# Patient Record
Sex: Male | Born: 1950
Health system: Southern US, Community
[De-identification: ages and names within clinical notes are randomized; demographics above are authoritative.]

## PROBLEM LIST (undated history)

## (undated) DIAGNOSIS — T7840XA Allergy, unspecified, initial encounter: Secondary | ICD-10-CM

## (undated) DIAGNOSIS — K635 Polyp of colon: Secondary | ICD-10-CM

## (undated) DIAGNOSIS — Z91030 Bee allergy status: Secondary | ICD-10-CM

## (undated) DIAGNOSIS — I10 Essential (primary) hypertension: Secondary | ICD-10-CM

## (undated) DIAGNOSIS — Z91013 Allergy to seafood: Secondary | ICD-10-CM

## (undated) DIAGNOSIS — H269 Unspecified cataract: Secondary | ICD-10-CM

## (undated) DIAGNOSIS — K219 Gastro-esophageal reflux disease without esophagitis: Secondary | ICD-10-CM

## (undated) DIAGNOSIS — E7800 Pure hypercholesterolemia, unspecified: Secondary | ICD-10-CM

## (undated) DIAGNOSIS — M1990 Unspecified osteoarthritis, unspecified site: Secondary | ICD-10-CM

## (undated) DIAGNOSIS — C801 Malignant (primary) neoplasm, unspecified: Secondary | ICD-10-CM

## (undated) HISTORY — DX: Polyp of colon: K63.5

## (undated) HISTORY — DX: Unspecified cataract: H26.9

---

## 1978-03-04 DIAGNOSIS — Z91030 Bee allergy status: Secondary | ICD-10-CM

## 1978-03-04 HISTORY — DX: Bee allergy status: Z91.030

## 1983-03-05 HISTORY — PX: CARPAL TUNNEL RELEASE: SHX101

## 2002-01-10 ENCOUNTER — Emergency Department (HOSPITAL_COMMUNITY): Admission: EM | Admit: 2002-01-10 | Discharge: 2002-01-10 | Payer: Self-pay | Admitting: Emergency Medicine

## 2002-01-10 ENCOUNTER — Encounter: Payer: Self-pay | Admitting: Emergency Medicine

## 2004-04-09 ENCOUNTER — Other Ambulatory Visit: Admission: RE | Admit: 2004-04-09 | Discharge: 2004-04-09 | Payer: Self-pay | Admitting: *Deleted

## 2007-05-03 ENCOUNTER — Emergency Department (HOSPITAL_COMMUNITY): Admission: EM | Admit: 2007-05-03 | Discharge: 2007-05-04 | Payer: Self-pay | Admitting: Emergency Medicine

## 2007-05-07 ENCOUNTER — Ambulatory Visit (HOSPITAL_COMMUNITY): Admission: RE | Admit: 2007-05-07 | Discharge: 2007-05-07 | Payer: Self-pay | Admitting: Family Medicine

## 2007-06-29 ENCOUNTER — Ambulatory Visit: Payer: Self-pay | Admitting: Internal Medicine

## 2007-06-29 ENCOUNTER — Observation Stay (HOSPITAL_COMMUNITY): Admission: EM | Admit: 2007-06-29 | Discharge: 2007-06-30 | Payer: Self-pay | Admitting: Emergency Medicine

## 2007-07-16 ENCOUNTER — Ambulatory Visit: Payer: Self-pay | Admitting: Internal Medicine

## 2009-07-29 ENCOUNTER — Emergency Department (HOSPITAL_COMMUNITY): Admission: EM | Admit: 2009-07-29 | Discharge: 2009-07-29 | Payer: Self-pay | Admitting: Family Medicine

## 2010-05-21 LAB — B. BURGDORFI ANTIBODIES: B burgdorferi Ab IgG+IgM: 0.76 {ISR}

## 2010-05-21 LAB — ROCKY MTN SPOTTED FVR AB, IGM-BLOOD: RMSF IgM: 0.21 IV (ref 0.00–0.89)

## 2010-05-21 LAB — ROCKY MTN SPOTTED FVR AB, IGG-BLOOD: RMSF IgG: 0.44 IV

## 2010-07-17 NOTE — Discharge Summary (Signed)
NAMECARLIS, BLANCHARD            ACCOUNT NO.:  [id number]   MEDICAL RECORD NO.:  [id number]          PATIENT TYPE:  INP   LOCATION:  3705                         FACILITY:  [facility]   PHYSICIAN:  Gerrit Friends. Dietrich Pates, MD, FACCDATE OF BIRTH:  [birth year]   DATE OF ADMISSION:  06/29/2007  DATE OF DISCHARGE:  06/30/2007                               DISCHARGE SUMMARY   PRIMARY CARDIOLOGIST:  Pricilla Riffle, MD, [facility].   PRIMARY CARE PHYSICIAN:  Broadus John T. Pamalee Leyden, MD.   PROCEDURES PERFORMED DURING HOSPITALIZATION:  None.   FINAL DISCHARGE DIAGNOSES:  1. Atypical chest pain.  2. History of hypertension.  3. Hyperlipidemia.   HISTORY OF PRESENT ILLNESS:  This is a 60 year old Caucasian male who  presented to [facility] secondary to chest discomfort, which  awakened him at 4:00 a.m. with anterior chest pressure.  He described it  as soreness associated with some shortness of breath with no radiation.  He states that it was worse lying down or sitting straight up.  The  patient was given IV Toradol and nitroglycerin in the emergency room,  his chest discomfort did not improve, but he had no further complaints.  The patient was admitted to rule out myocardial infarction.  Also, a D-  dimer was evaluated secondary to the patient's recent long car trip to  [place].  She was seen and examined by Dr. Dietrich Pates and Joellyn Rued, physician assistant, on initial evaluation.   The patient did have cardiac enzymes cycled, which were found to be  negative x2 with a troponin of 0.05, 0.02, and 0.01 respectively.  His  TSH was 1.019, hemoglobin A1c 5.5,  PT 29, PT 12.0, INR 0.9; D-dimer  0.26, total cholesterol 201, HDL 33, triglycerides 137, LDL 141.  The  patient's EKG was found to be normal.   The following day, the patient was seen and examined by Dr. [place] Bing and followup EKG was completed and found to be negative and  normal.  His chest x-ray was reviewed revealing no  acute pulmonary  disease.  The patient was stable and was released to return home to  follow up with his primary care physician to check for other etiology of  chest discomfort and also to follow up with Dr. Dietrich Pates as an  outpatient to have a standard treadmill test in our office, which has  been ordered.   DISCHARGE LABS:  As above with the addition of sodium 139, potassium  4.2, chloride 107, CO2 25, BUN 12, creatinine 0.85, glucose 109,  hemoglobin 15.2, hematocrit 44.3, white blood cells 38.6, and platelets  251.   VITAL SIGNS ON DISCHARGE:  On discharge, blood pressure 113/72, heart  rate 66, respirations 18, temperature 98.2, and O2 sat 97% on room air.   DISCHARGE MEDICATIONS:  1. Amlodipine 5 mg daily.  2. Protonix 40 mg daily.  3. Zyrtec as needed.   ALLERGIES:  No known drug allergies.   FOLLOW-UP PLANS AND APPOINTMENTS:  1. The patient is to follow with Dr. Tanya Nones on his own accord for      continued  evaluation from medical standpoint.  2. The patient is to follow with Dr. Dietrich Pates on Monday Jul 20, 2007, at 10:45 a.m. for a treadmill stress test and followup      appointment the same day.   Time spent with discharge 30 minutes.      Bettey Mare. Lyman Bishop, NP      Gerrit Friends. Dietrich Pates, MD, [facility]  Electronically Signed    KML/MEDQ  D:  06/30/2007  T:  07/01/2007  Job:  [id number]   cc:   Broadus John T. Pamalee Leyden, MD

## 2010-07-17 NOTE — Procedures (Signed)
[facility]                              EXERCISE TREADMILL   NAME:[name], [name]                     MRN:          [id number]  DATE:07/16/2007                            DOB:          06/12/1950    IDENTIFICATION:  The patient is a 60 year old recently discharged from  [facility] admitted for atypical chest pain ruled out.  Set up  for a treadmill test.   STRESS DATA:  The patient exercised in the Bruce protocol.  Baseline EKG  showed sinus rhythm at 70 beats per minute.  Baseline blood pressure  141/81.   The patient exercised for 9 minutes 32 seconds to a peak heart rate of  151 which was 91% predicted maximum.  Peak blood pressure was 206/76.  This was for a total of 10.9 mets.  The patient experienced no chest  pain.  EKG showed no significant ST changes to suggest ischemia.  Note,  there were some isolated PVCs during the study in the recovery period  but not significant.   IMPRESSION:  Exercise treadmill, clinically and electrically negative  for ischemia, good exercise tolerance.   IMPRESSION:  Overall, the patient's chest pain was atypical.  His  treadmill again supports this.   The patient on admission had a fasting lipid panel done that showed a  total cholesterol of 201, HDL low at 33, LDL of 141.  I have discussed  this with him.  I think probably intervening he thinks his diet cannot  be changed much.  He has had weight loss in the past and he said he has  not changed his number much.  Would probably consider a Statin to lower  his cardiac risk status.  He said he will discuss with his primary and  we will forward this note to him.     Pricilla Riffle, MD, [facility]  Electronically Signed    PVR/MedQ  DD: 07/16/2007  DT: 07/16/2007  Job #: [id number]   cc:   Broadus John T. Pamalee Leyden, MD

## 2010-07-17 NOTE — H&P (Signed)
[name], [name] NO.:  [id number]   MEDICAL RECORD NO.:  [id number]          PATIENT TYPE:  INP   LOCATION:  3705                         FACILITY:  [facility]   PHYSICIAN:  Pricilla Riffle, MD, FACCDATE OF BIRTH:  1950-03-30   DATE OF ADMISSION:  06/29/2007  DATE OF DISCHARGE:                              HISTORY & PHYSICAL   HISTORY:  [name] is a 60 year old white male who presented via  private vehicle to [facility] secondary to chest  discomfort.  He states he was awaken at approximately 4 a.m. with an  anterior chest pressure sensation.  He also describes this as a soreness  associated with some shortness of breath.  He denied any radiation,  nausea, vomiting, or diaphoresis.  That discomfort is pleuritic in  nature.  It also seems to worsen with lying down or sitting straight up.  He gives it a 3-5 on a scale of 0-10.  Last time it was 0 was prior to  onset.  He denies prior recurrence change with exertion, associated gas,  or water brash.  In the emergency room, he has received IV Toradol and  nitroglycerin.  It has not improved his discomfort but it has made him  care less about his discomfort.  He does admit to recent travel to  [place], [place] over the weekend, however, he denies any recent  accidents or injuries.   PAST MEDICAL HISTORY:  Notable for no known drug allergies.  He is on a  blood pressure medication at home.  He is not clear what this medication  is.  Based on his last ER visit in March 2009, it could consist of  either ramipril, or Diovan or Cardura.  Past medical history is notable  for hypertension, he states he checks his blood pressure at home.  His  blood pressure ranges from 120s to 130s over 60s to 80s.  He has a  history of hyperlipidemia.  However, he is not sure when the last check  was or what the specific results were.  He has been on medications in  the past, however, he thinks that he has not gotten  his cholesterol  medication filled within a last month or two.  He does recall seeing a  cardiologist approximately 5 years ago and recalls a stress test.  He  states that this was okay.  He cannot recall the physician's name, the  type of stress test or any other testing.   SURGICAL HISTORY:  Notable for a right elbow release with some teeth  removal.  He specifically denies diabetes, myocardial infarctions, CVA,  COPD, bleeding, dyscrasia, thyroid disorder, or renal dysfunction.   SOCIAL HISTORY:  He lives in [place] with his wife.  He has one  son, age 77.  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  He also enjoys Advertising copywriter.  He does not smoke.  He may have 1 beer per day.  He denies  any drugs or herbal medications.  He tries to adhere  to a low-fat diet.  He rarely exercises.   FAMILY HISTORY:  His mother is deceased at the age of 37 with CHF.  She  also had a history of COPD and hypertension.  Father is deceased at the  age of 75 with CHF and he has had a AAA rupture.  He does not have any  brothers or sisters.   REVIEW OF SYMPTOMS:  In addition to the above, is notable for contacts  and nocturia.  All other system points are unremarkable.   PHYSICAL EXAMINATION:  GENERAL:  Well-nourished, well-developed pleasant  white male, in no acute distress.  VITAL SIGNS:  Temperature is 98.7, initial blood pressure is 154/84, now  is 116/56, pulse is 81, and respirations 18.  HEENT:  Unremarkable.  NECK:  Supple without thyromegaly, adenopathy, JVD, or carotid bruits.  CHEST:  Symmetrical excursion.  LUNGS:  Clear to auscultation and do not appreciate pleural rub.  HEART:  PMI is not displaced.  Regular rate and rhythm.  He does have a  very small 1/6 systolic murmur best appreciated at the left sternal  border.  He did not have a cardiac rub.  SKIN:  Integument appears to be intact.  ABDOMEN:  Unremarkable and  without masses or tenderness.  EXTREMITIES:  Negative cyanosis, clubbing, or edema.  MUSCULOSKELETAL:  Unremarkable.  He specifically does not have any chest  wall tenderness.  There is no reproduction or increase of his chest  discomfort with approximately a full range of motion.  NEUROLOGICAL:  Unremarkable.   Chest x-ray showed no active disease.  EKG showed normal sinus rhythm.  Normal access.  Ventricular rate 77.  Non-specific ST-T wave changes,  early R.  No acute changes compared to an EKG in March 2009.   I-STAT showed H&H of 15.6 and 46.0.  Sodium 142, potassium 4.0, BUN 13,  creatinine 1.0, glucose 101, point-of-care marker were negative x1.   IMPRESSION:  1. Prolonged atypical chest discomfort with initial EKG and point-of-      care marker unremarkable.  2. Hypertension.  3. History is noted for past medical history.   DISPOSITION:  Dr. Dietrich Pates reviewed the patient's history and spoke with  and examined the patient and agrees with the above.  He will remain here  in the night for observation.  While here in the emergency room, we will  give him a one time dose of morphine as well as a GI cocktail and  attempts to relieve his discomfort.  We will check his D-dimer as well  as rule out myocardial infarction.  We will place him on a beta blocker  as well as Protonix to cover any potential GI issues.  He is ruled out  for myocardial infarction.  He could be discharged home in the morning  and followup with his primary care physician.  Further recommendations  will be based on findings.      Dale Rued, PA-C      Pricilla Riffle, MD, [facility]  Electronically Signed    EW/MEDQ  D:  06/29/2007  T:  06/29/2007  Job:  [id number]   cc:   Pricilla Riffle, MD, [facility]  Dr. Rayne Du

## 2010-11-26 LAB — CBC
MCV: 94
Platelets: 232
RBC: 4.3
WBC: 12.3 — ABNORMAL HIGH

## 2010-11-26 LAB — DIFFERENTIAL
Eosinophils Absolute: 0
Lymphocytes Relative: 2 — ABNORMAL LOW
Lymphs Abs: 0.2 — ABNORMAL LOW
Neutro Abs: 11.5 — ABNORMAL HIGH
Neutrophils Relative %: 93 — ABNORMAL HIGH

## 2010-11-26 LAB — I-STAT 8, (EC8 V) (CONVERTED LAB)
Acid-base deficit: 6 — ABNORMAL HIGH
BUN: 17
Bicarbonate: 19.1 — ABNORMAL LOW
Chloride: 107
Glucose, Bld: 110 — ABNORMAL HIGH
HCT: 44
Hemoglobin: 15
Operator id: 277751
Potassium: 3.5
Sodium: 137
TCO2: 20
pCO2, Ven: 36.7 — ABNORMAL LOW
pH, Ven: 7.325 — ABNORMAL HIGH

## 2010-11-26 LAB — POCT I-STAT CREATININE
Creatinine, Ser: 1.2
Operator id: 277751

## 2010-11-26 LAB — URINALYSIS, ROUTINE W REFLEX MICROSCOPIC
Bilirubin Urine: NEGATIVE
Glucose, UA: NEGATIVE
Ketones, ur: NEGATIVE
Nitrite: NEGATIVE
pH: 5.5

## 2010-11-26 LAB — CREATININE, SERUM
Creatinine, Ser: 0.92
GFR calc non Af Amer: >60

## 2010-11-27 LAB — CK TOTAL AND CKMB (NOT AT ARMC)
CK, MB: 3.8
Relative Index: 2.2

## 2010-11-27 LAB — DIFFERENTIAL
Basophils Relative: 0
Monocytes Absolute: 0.9
Monocytes Relative: 7
Neutro Abs: 11.2 — ABNORMAL HIGH

## 2010-11-27 LAB — CARDIAC PANEL(CRET KIN+CKTOT+MB+TROPI)
CK, MB: 1.6
Relative Index: 1.5
Troponin I: 0.02

## 2010-11-27 LAB — APTT: aPTT: 29

## 2010-11-27 LAB — HEMOGLOBIN A1C
Hgb A1c MFr Bld: 5.5
Mean Plasma Glucose: 119

## 2010-11-27 LAB — COMPREHENSIVE METABOLIC PANEL
Albumin: 3.5
BUN: 12
Chloride: 107
Creatinine, Ser: 0.85
Total Bilirubin: 1
Total Protein: 6.3

## 2010-11-27 LAB — POCT I-STAT, CHEM 8
Calcium, Ion: 1.21
Glucose, Bld: 101 — ABNORMAL HIGH
HCT: 46
Hemoglobin: 15.6

## 2010-11-27 LAB — LIPID PANEL
HDL: 33 — ABNORMAL LOW
Total CHOL/HDL Ratio: 6.1
Triglycerides: 137
VLDL: 27

## 2010-11-27 LAB — POCT CARDIAC MARKERS
CKMB, poc: 3.5
Myoglobin, poc: 89.4

## 2010-11-27 LAB — CBC
Platelets: 251
RDW: 12.9

## 2011-04-03 ENCOUNTER — Other Ambulatory Visit: Payer: Self-pay | Admitting: Family Medicine

## 2011-04-03 DIAGNOSIS — R609 Edema, unspecified: Secondary | ICD-10-CM

## 2011-04-04 ENCOUNTER — Ambulatory Visit
Admission: RE | Admit: 2011-04-04 | Discharge: 2011-04-04 | Disposition: A | Discharge status: Home / Self Care | Payer: 59 | Source: Ambulatory Visit | Attending: Family Medicine | Admitting: Family Medicine

## 2011-04-04 DIAGNOSIS — R609 Edema, unspecified: Secondary | ICD-10-CM

## 2012-01-13 ENCOUNTER — Encounter (HOSPITAL_COMMUNITY): Payer: Self-pay | Admitting: *Deleted

## 2012-01-13 ENCOUNTER — Emergency Department (HOSPITAL_COMMUNITY)
Admission: EM | Admit: 2012-01-13 | Discharge: 2012-01-13 | Disposition: A | Discharge status: Home / Self Care | Payer: 59 | Attending: Emergency Medicine | Admitting: Emergency Medicine

## 2012-01-13 DIAGNOSIS — I10 Essential (primary) hypertension: Secondary | ICD-10-CM | POA: Insufficient documentation

## 2012-01-13 DIAGNOSIS — R42 Dizziness and giddiness: Secondary | ICD-10-CM | POA: Insufficient documentation

## 2012-01-13 DIAGNOSIS — R5383 Other fatigue: Secondary | ICD-10-CM | POA: Insufficient documentation

## 2012-01-13 DIAGNOSIS — K219 Gastro-esophageal reflux disease without esophagitis: Secondary | ICD-10-CM | POA: Insufficient documentation

## 2012-01-13 DIAGNOSIS — R51 Headache: Secondary | ICD-10-CM | POA: Insufficient documentation

## 2012-01-13 DIAGNOSIS — R5381 Other malaise: Secondary | ICD-10-CM | POA: Insufficient documentation

## 2012-01-13 DIAGNOSIS — I951 Orthostatic hypotension: Secondary | ICD-10-CM | POA: Insufficient documentation

## 2012-01-13 DIAGNOSIS — Z79899 Other long term (current) drug therapy: Secondary | ICD-10-CM | POA: Insufficient documentation

## 2012-01-13 DIAGNOSIS — E876 Hypokalemia: Secondary | ICD-10-CM | POA: Insufficient documentation

## 2012-01-13 DIAGNOSIS — R55 Syncope and collapse: Secondary | ICD-10-CM

## 2012-01-13 HISTORY — DX: Gastro-esophageal reflux disease without esophagitis: K21.9

## 2012-01-13 HISTORY — DX: Essential (primary) hypertension: I10

## 2012-01-13 LAB — BASIC METABOLIC PANEL
BUN: 14 mg/dL (ref 6–23)
Chloride: 100 mEq/L (ref 96–112)
Creatinine, Ser: 0.97 mg/dL (ref 0.50–1.35)
GFR calc Af Amer: >90 mL/min (ref >90)
GFR calc non Af Amer: 89 mL/min — ABNORMAL LOW (ref >90)
Glucose, Bld: 138 mg/dL — ABNORMAL HIGH (ref 70–99)

## 2012-01-13 LAB — CBC WITH DIFFERENTIAL/PLATELET
Basophils Relative: 0 % (ref 0–1)
Eosinophils Absolute: 0 10*3/uL (ref 0.0–0.7)
HCT: 41.3 % (ref 39.0–52.0)
Hemoglobin: 14.7 g/dL (ref 13.0–17.0)
Lymphs Abs: 1 10*3/uL (ref 0.7–4.0)
MCH: 31.6 pg (ref 26.0–34.0)
MCHC: 35.6 g/dL (ref 30.0–36.0)
Monocytes Absolute: 0.6 10*3/uL (ref 0.1–1.0)
Monocytes Relative: 6 % (ref 3–12)
RBC: 4.65 MIL/uL (ref 4.22–5.81)

## 2012-01-13 LAB — URINALYSIS, ROUTINE W REFLEX MICROSCOPIC
Bilirubin Urine: NEGATIVE
Hgb urine dipstick: NEGATIVE
Ketones, ur: NEGATIVE mg/dL
Nitrite: NEGATIVE
Specific Gravity, Urine: 1.014 (ref 1.005–1.030)
pH: 7.5 (ref 5.0–8.0)

## 2012-01-13 LAB — POCT I-STAT TROPONIN I

## 2012-01-13 MED ORDER — SODIUM CHLORIDE 0.9 % IV BOLUS (SEPSIS)
1000.0000 mL | Freq: Once | INTRAVENOUS | Status: AC
  Administered 2012-01-13: 1000 mL via INTRAVENOUS

## 2012-01-13 MED ORDER — POTASSIUM CHLORIDE 10 MEQ/100ML IV SOLN
10.0000 meq | Freq: Once | INTRAVENOUS | Status: AC
  Administered 2012-01-13: 10 meq via INTRAVENOUS
  Filled 2012-01-13: qty 100

## 2012-01-13 MED ORDER — POTASSIUM CHLORIDE CRYS ER 20 MEQ PO TBCR
40.0000 meq | EXTENDED_RELEASE_TABLET | Freq: Once | ORAL | Status: AC
  Administered 2012-01-13: 40 meq via ORAL
  Filled 2012-01-13: qty 2

## 2012-01-13 NOTE — ED Provider Notes (Signed)
History     CSN: [id number]  Arrival date & time 01/13/12  1002   First MD Initiated Contact with Patient 01/13/12 1011      Chief Complaint  Patient presents with  . Near Syncope    (Consider location/radiation/quality/duration/timing/severity/associated sxs/prior treatment) HPI Comments: [name] is a 61 y.o. Male who presented today with a complaint of dizziness, weakness, near syncope. Pt states he woke up this morning with a headache. States was thinking his BP may be high, so he took one of his old BP medications which he has not had in over a year. States he used to be on this medication, and 4 others, but his doctor took him off of them because blood pressure has been good. States went to work where he began feeling dizzy and weak. He was taken to a nurse who measured his BP and it was 80/40. Pt States he is feeling slightly better. Denies chest pain, SOB, denies recent travel, denies recent surgeries, no recent illnesses   Past Medical History  Diagnosis Date  . Hypertension   . GERD (gastroesophageal reflux disease)     History reviewed. No pertinent past surgical history.  No family history on file.  History  Substance Use Topics  . Smoking status: Never Smoker   . Smokeless tobacco: Not on file  . Alcohol Use:       Review of Systems  HENT: Negative for neck pain and neck stiffness.   Neurological: Positive for dizziness, weakness, light-headedness and headaches.  All other systems reviewed and are negative.    Allergies  Review of patient's allergies indicates no known allergies.  Home Medications   Current Outpatient Rx  Name  Route  Sig  Dispense  Refill  . DOXAZOSIN MESYLATE 8 MG PO TABS   Oral   Take 8 mg by mouth once.         Marland Kitchen FAMOTIDINE 10 MG PO TABS   Oral   Take 10 mg by mouth at bedtime.         Marland Kitchen HYDROCHLOROTHIAZIDE 25 MG PO TABS   Oral   Take 25 mg by mouth daily.         Marland Kitchen PANTOPRAZOLE SODIUM 40 MG PO TBEC  Oral   Take 40 mg by mouth daily.         Marland Kitchen PRAVASTATIN SODIUM 40 MG PO TABS   Oral   Take 40 mg by mouth daily.           BP 79/73  Pulse 72  Temp 97.4 F (36.3 C) (Oral)  Resp 18  Physical Exam  Nursing note and vitals reviewed. Constitutional: He is oriented to person, place, and time. He appears well-developed and well-nourished. No distress.  HENT:  Head: Normocephalic and atraumatic.  Right Ear: External ear normal.  Left Ear: External ear normal.  Nose: Nose normal.  Mouth/Throat: Oropharynx is clear and moist.  Eyes: Conjunctivae normal and EOM are normal. Pupils are equal, round, and reactive to light.  Neck: Neck supple.  Cardiovascular: Normal rate, regular rhythm and normal heart sounds.   Pulmonary/Chest: Effort normal and breath sounds normal. No respiratory distress. He has no wheezes. He has no rales.  Abdominal: Soft. Bowel sounds are normal. He exhibits no distension. There is no tenderness. There is no rebound.  Musculoskeletal: He exhibits no edema.  Neurological: He is alert and oriented to person, place, and time. He displays normal reflexes. No cranial nerve deficit. Coordination normal.  Skin:  Skin is warm and dry.  Psychiatric: He has a normal mood and affect.    ED Course  Procedures (including critical care time)  Pt took doxazosin 8mg  today, having weakness, dizziness, presyncopal episode. No HA or CP, or SOB currently. Will get orthostatics, labs, ECG  Results for orders placed during the hospital encounter of 01/13/12  CBC WITH DIFFERENTIAL      Component Value Range   WBC 10.5  4.0 - 10.5 K/uL   RBC 4.65  4.22 - 5.81 MIL/uL   Hemoglobin 14.7  13.0 - 17.0 g/dL   HCT 09.8  11.9 - 14.7 %   MCV 88.8  78.0 - 100.0 fL   MCH 31.6  26.0 - 34.0 pg   MCHC 35.6  30.0 - 36.0 g/dL   RDW 82.9  56.2 - 13.0 %   Platelets 217  150 - 400 K/uL   Neutrophils Relative 84 (*) 43 - 77 %   Neutro Abs 8.8 (*) 1.7 - 7.7 K/uL   Lymphocytes Relative 9 (*)  12 - 46 %   Lymphs Abs 1.0  0.7 - 4.0 K/uL   Monocytes Relative 6  3 - 12 %   Monocytes Absolute 0.6  0.1 - 1.0 K/uL   Eosinophils Relative 0  0 - 5 %   Eosinophils Absolute 0.0  0.0 - 0.7 K/uL   Basophils Relative 0  0 - 1 %   Basophils Absolute 0.0  0.0 - 0.1 K/uL  BASIC METABOLIC PANEL      Component Value Range   Sodium 139  135 - 145 mEq/L   Potassium 2.9 (*) 3.5 - 5.1 mEq/L   Chloride 100  96 - 112 mEq/L   CO2 28  19 - 32 mEq/L   Glucose, Bld 138 (*) 70 - 99 mg/dL   BUN 14  6 - 23 mg/dL   Creatinine, Ser 8.65  0.50 - 1.35 mg/dL   Calcium 8.9  8.4 - 78.4 mg/dL   GFR calc non Af Amer 89 (*) >90 mL/min   GFR calc Af Amer >90  >90 mL/min  URINALYSIS, ROUTINE W REFLEX MICROSCOPIC      Component Value Range   Color, Urine YELLOW  YELLOW   APPearance CLEAR  CLEAR   Specific Gravity, Urine 1.014  1.005 - 1.030   pH 7.5  5.0 - 8.0   Glucose, UA NEGATIVE  NEGATIVE mg/dL   Hgb urine dipstick NEGATIVE  NEGATIVE   Bilirubin Urine NEGATIVE  NEGATIVE   Ketones, ur NEGATIVE  NEGATIVE mg/dL   Protein, ur NEGATIVE  NEGATIVE mg/dL   Urobilinogen, UA 0.2  0.0 - 1.0 mg/dL   Nitrite NEGATIVE  NEGATIVE   Leukocytes, UA NEGATIVE  NEGATIVE  POCT I-STAT TROPONIN I      Component Value Range   Troponin i, poc 0.00  0.00 - 0.08 ng/mL   Comment 3             Date: 01/13/2012  Rate: 71  Rhythm: normal sinus rhythm  QRS Axis: normal  Intervals: normal  ST/T Wave abnormalities: nonspecific T wave changes  Conduction Disutrbances:none  Narrative Interpretation:   Old EKG Reviewed: none available   Filed Vitals:   01/13/12 1130 01/13/12 1234 01/13/12 1235 01/13/12 1237  BP: 122/71 133/70 130/75 123/88  Pulse: 76 78 89 91  Temp:      TempSrc:      Resp: 23     SpO2: 95%  1. Orthostatic hypotension   2. Pre-syncope   3. Hypokalemia       MDM  Pt with presyncopal episode at work. Pt was found to have a BP of 80/40, transferred here for further eval. By the time pt  arrived here, his BP was 108/58. Pt is feeling better. Orthostatics obtained and BP did drop to 79/73 when standing up, pt however, did not get dizzy. Pt received fluids. Potassium PO and IV. He ate lunch. His repeat orthostatics are normal. He is feeling back to normal. H ambulated in the hallway with no distress. No arrhythmia's on ECG. He is stable for d/c home. i do suspect his symptoms were due to a drop in BP caused by takin a high dose of doxazosin which he normally does not take. Pt is to follow up with PCP for recheck of potassium and BP in 2-3 days.         Lottie Mussel, PA 01/13/12 1530

## 2012-01-13 NOTE — ED Notes (Signed)
Troponin I results as follows: 0.00  Ng/mL

## 2012-01-13 NOTE — ED Notes (Signed)
While at work became dizzy, lightheaded & felt faint. Nurse at work stated BP 80/40. No fluids given by EMS

## 2012-01-13 NOTE — ED Notes (Signed)
Pt c/o h/a this am, thought BP was high therefore took an additional BP pill that he used to take in additional to regular BP med. Had near syncopal episode while at work. Denies CP, palpitations, SOB. Upon arrival to to ED denied lightheadedness, dizziness. "I didn't want to come here. I feel fine now".

## 2012-01-14 NOTE — ED Provider Notes (Signed)
Medical screening examination/treatment/procedure(s) were performed by non-physician practitioner and as supervising physician I was immediately available for consultation/collaboration.  Concur with assessment and plan.  Shelda Jakes, MD 01/14/12 [phone]

## 2012-01-22 ENCOUNTER — Ambulatory Visit (INDEPENDENT_AMBULATORY_CARE_PROVIDER_SITE_OTHER): Payer: 59 | Admitting: *Deleted

## 2012-01-22 DIAGNOSIS — R1900 Intra-abdominal and pelvic swelling, mass and lump, unspecified site: Secondary | ICD-10-CM

## 2012-05-25 ENCOUNTER — Telehealth: Payer: Self-pay | Admitting: Family Medicine

## 2012-05-25 ENCOUNTER — Encounter: Payer: Self-pay | Admitting: Family Medicine

## 2012-05-25 MED ORDER — PREDNISONE 20 MG PO TABS: 20.0000 mg | ORAL_TABLET | Freq: Every day | ORAL | Status: DC

## 2012-05-25 NOTE — Telephone Encounter (Signed)
Please call out prenisone dose pack

## 2012-05-25 NOTE — Telephone Encounter (Signed)
Pred rx sent to pharm.

## 2012-05-25 NOTE — Telephone Encounter (Signed)
This encounter was created in error - please disregard.

## 2012-05-26 ENCOUNTER — Telehealth: Payer: Self-pay | Admitting: Family Medicine

## 2012-05-26 NOTE — Telephone Encounter (Signed)
RX called in yesterday for allergic reaction to fresh wood.  Predinisone taper dose called in.  Took three doses yesterday.  Today is taking second day but is starting to have break through allergic reaction again. Facial redness and spots and itching.  Concerned usually once starts steroid does OK.  What else can be done??  Does he need some sort of shot?? Cell # [phone]

## 2012-05-26 NOTE — Telephone Encounter (Signed)
Pt called.  Advised stay on Prednisone oral and take Benadryl 50mg  Q6hr prn per Dr. Tanya Nones.  If does not improve we will need to see.

## 2012-05-26 NOTE — Telephone Encounter (Signed)
Shot is equivalent to taper med. Start benadryl 50 q 6 hours or NTBS if worse

## 2012-06-18 ENCOUNTER — Encounter: Payer: Self-pay | Admitting: Family Medicine

## 2012-06-18 ENCOUNTER — Ambulatory Visit (INDEPENDENT_AMBULATORY_CARE_PROVIDER_SITE_OTHER): Payer: 59 | Admitting: Family Medicine

## 2012-06-18 VITALS — BP 122/64 | HR 78 | Temp 98.4°F | Resp 16 | Wt 229.0 lb

## 2012-06-18 DIAGNOSIS — M75100 Unspecified rotator cuff tear or rupture of unspecified shoulder, not specified as traumatic: Secondary | ICD-10-CM

## 2012-06-18 DIAGNOSIS — Z5189 Encounter for other specified aftercare: Secondary | ICD-10-CM

## 2012-06-18 DIAGNOSIS — I10 Essential (primary) hypertension: Secondary | ICD-10-CM | POA: Insufficient documentation

## 2012-06-18 DIAGNOSIS — T7840XD Allergy, unspecified, subsequent encounter: Secondary | ICD-10-CM

## 2012-06-18 DIAGNOSIS — IMO0002 Reserved for concepts with insufficient information to code with codable children: Secondary | ICD-10-CM

## 2012-06-18 DIAGNOSIS — M7551 Bursitis of right shoulder: Secondary | ICD-10-CM

## 2012-06-18 DIAGNOSIS — E785 Hyperlipidemia, unspecified: Secondary | ICD-10-CM | POA: Insufficient documentation

## 2012-06-18 MED ORDER — LEVOCETIRIZINE DIHYDROCHLORIDE 5 MG PO TABS: 5.0000 mg | ORAL_TABLET | Freq: Every evening | ORAL | Status: DC

## 2012-06-18 NOTE — Progress Notes (Signed)
  Subjective:    Patient ID: [name], male    DOB: 1951-01-27, 62 y.o.   MRN: [id number]  HPI  Problem #1-allergic reaction, patient reports that when he handles wet wood he develops an allergic reaction. He states he develops a flushing in the face and then red scaly skin at the hairline of the scalp and eyebrows, his mustache, and behind his ears. He states it iproduces serous yellow discharge and scale.  It responds to corticosteroids.  Although the rash sounds like seborrheic dermatitis, he states it only happens when he handles wet wood or wood sap.  Problem #2-right shoulder pain, outpatient in January. He is having pain with abduction of his right shoulder. His exam revealed subacromial bursitis. At that time he underwent a cortisone injection. He states the pain improved for over 3 months. However it has gradually returned. He has pain with abduction. Headaches at the top of his shoulder. Past Medical History  Diagnosis Date  . Hypertension   . GERD (gastroesophageal reflux disease)    Current Outpatient Prescriptions on File Prior to Visit  Medication Sig Dispense Refill  . doxazosin (CARDURA) 8 MG tablet Take by mouth once.       . famotidine (PEPCID) 10 MG tablet Take 10 mg by mouth at bedtime.      . hydrochlorothiazide (HYDRODIURIL) 25 MG tablet Take 25 mg by mouth daily.      . pantoprazole (PROTONIX) 40 MG tablet Take 40 mg by mouth daily.      . pravastatin (PRAVACHOL) 40 MG tablet Take 40 mg by mouth daily.       No current facility-administered medications on file prior to visit.   No Known Allergies  Review of Systems  All other systems reviewed and are negative.       Objective:   Physical Exam  Constitutional: He appears well-developed and well-nourished.  Cardiovascular: Normal rate and normal heart sounds.   No murmur heard. Pulmonary/Chest: Effort normal and breath sounds normal.  Musculoskeletal: Normal range of motion. He exhibits no edema and  no tenderness.  Skin: Skin is warm and dry. No rash noted. No erythema. No pallor.   he has a positive empty can sign his right shoulder.  Hawkins test is negative.        Assessment & Plan:  Allergic reaction, subsequent encounter  Subacromial bursitis, right  I gave the patient prescription for his Xyzal 5 mg tab.  He is to take one daily. If the reaction stop happening we will continue with his therapy. If he continues to have this reaction treelike savory dermatitis daily Nizoral shampoo.  If it continues to persist would consider sending him to an allergist.  Discussed sending him to physical therapy versus obtaining an MRI of his right shoulder. The patient like to consider his decisions at home tonight and then call me back with his decision.  I recommended he proceed with physical therapy.

## 2012-06-29 ENCOUNTER — Telehealth: Payer: Self-pay | Admitting: Family Medicine

## 2012-06-29 MED ORDER — DOXAZOSIN MESYLATE 2 MG PO TABS: 2.0000 mg | ORAL_TABLET | Freq: Every day | ORAL | Status: DC

## 2012-06-29 NOTE — Telephone Encounter (Signed)
Rx Refilled  

## 2012-07-06 ENCOUNTER — Telehealth: Payer: Self-pay | Admitting: Family Medicine

## 2012-07-06 MED ORDER — DOXAZOSIN MESYLATE 4 MG PO TABS: 4.0000 mg | ORAL_TABLET | Freq: Every day | ORAL | Status: DC

## 2012-07-06 NOTE — Telephone Encounter (Signed)
Rx filled

## 2012-07-06 NOTE — Telephone Encounter (Signed)
Patient aware.

## 2012-07-06 NOTE — Telephone Encounter (Signed)
Medication corrected.

## 2012-07-07 ENCOUNTER — Encounter: Payer: Self-pay | Admitting: Family Medicine

## 2012-07-07 ENCOUNTER — Ambulatory Visit (INDEPENDENT_AMBULATORY_CARE_PROVIDER_SITE_OTHER): Payer: 59 | Admitting: Family Medicine

## 2012-07-07 VITALS — BP 132/74 | HR 74 | Temp 98.1°F | Resp 16 | Wt 226.0 lb

## 2012-07-07 DIAGNOSIS — W57XXXA Bitten or stung by nonvenomous insect and other nonvenomous arthropods, initial encounter: Secondary | ICD-10-CM

## 2012-07-07 DIAGNOSIS — L02419 Cutaneous abscess of limb, unspecified: Secondary | ICD-10-CM

## 2012-07-07 DIAGNOSIS — L03119 Cellulitis of unspecified part of limb: Secondary | ICD-10-CM

## 2012-07-07 MED ORDER — DOXYCYCLINE HYCLATE 100 MG PO TABS: 100.0000 mg | ORAL_TABLET | Freq: Two times a day (BID) | ORAL | Status: DC

## 2012-07-07 NOTE — Progress Notes (Signed)
  Subjective:    Patient ID: [name], male    DOB: 1950/12/20, 62 y.o.   MRN: [id number]  HPI One week ago the patient suffered a tick bite. The tick was on for possibly 48 hours. There is now a red hot ring on his left hip. It is warm to the touch. Slightly tender. It has more the appearance of urticaria. He denies any systemic illness. He denies any fevers or chills. He denies any myalgias or arthralgias.   Past Medical History  Diagnosis Date  . Hypertension   . GERD (gastroesophageal reflux disease)    Current Outpatient Prescriptions on File Prior to Visit  Medication Sig Dispense Refill  . doxazosin (CARDURA) 4 MG tablet Take 1 tablet (4 mg total) by mouth daily.  30 tablet  5  . famotidine (PEPCID) 10 MG tablet Take 10 mg by mouth at bedtime.      . hydrochlorothiazide (HYDRODIURIL) 25 MG tablet Take 25 mg by mouth daily.      Marland Kitchen levocetirizine (XYZAL) 5 MG tablet Take 1 tablet (5 mg total) by mouth every evening.  30 tablet  1  . losartan (COZAAR) 100 MG tablet Take 100 mg by mouth daily.      . pantoprazole (PROTONIX) 40 MG tablet Take 40 mg by mouth daily.      . pravastatin (PRAVACHOL) 40 MG tablet Take 40 mg by mouth daily.       No current facility-administered medications on file prior to visit.   No Known Allergies History   Social History  . Marital Status: Married    Spouse Name: N/A    Number of Children: N/A  . Years of Education: N/A   Occupational History  . Not on file.   Social History Main Topics  . Smoking status: Never Smoker   . Smokeless tobacco: Not on file  . Alcohol Use:   . Drug Use: No  . Sexually Active:    Other Topics Concern  . Not on file   Social History Narrative  . No narrative on file      Review of Systems  Musculoskeletal: Negative for myalgias, back pain, joint swelling and arthralgias.  Neurological: Negative for dizziness, weakness and headaches.  Hematological: Negative for adenopathy.       Objective:    Physical Exam  Vitals reviewed. Cardiovascular: Normal rate, regular rhythm and normal heart sounds.   No murmur heard. Pulmonary/Chest: Effort normal and breath sounds normal. No respiratory distress. He has no wheezes. He has no rales.   5 cm red hot ring on the patient's left hip        Assessment & Plan:  1. Tick bite Patient precautions regarding Lyme disease. Patient to call me if the red patch of cellulitis turns into a spreading red ring indicative of early Lyme disease.  If so I will extend coverage for a total of 21 days.  2. Cellulitis and abscess of leg For now I will treat a cellulitis with doxycycline 100 mg by mouth twice a day for 10 days.

## 2012-07-30 ENCOUNTER — Telehealth: Payer: Self-pay | Admitting: Family Medicine

## 2012-07-30 DIAGNOSIS — M7591 Shoulder lesion, unspecified, right shoulder: Secondary | ICD-10-CM

## 2012-07-30 NOTE — Telephone Encounter (Signed)
Pt states right shoulder is still hurting and would like for you to order MRI.

## 2012-07-30 NOTE — Telephone Encounter (Signed)
i will order mri, he could get cortisone shot here if he'd like.

## 2012-08-07 ENCOUNTER — Ambulatory Visit
Admission: RE | Admit: 2012-08-07 | Discharge: 2012-08-07 | Disposition: A | Discharge status: Home / Self Care | Payer: 59 | Source: Ambulatory Visit | Attending: Family Medicine | Admitting: Family Medicine

## 2012-08-07 DIAGNOSIS — M7591 Shoulder lesion, unspecified, right shoulder: Secondary | ICD-10-CM

## 2012-08-10 ENCOUNTER — Other Ambulatory Visit: Payer: Self-pay | Admitting: Family Medicine

## 2012-08-10 DIAGNOSIS — M7581 Other shoulder lesions, right shoulder: Secondary | ICD-10-CM

## 2012-08-11 ENCOUNTER — Other Ambulatory Visit: Payer: Self-pay | Admitting: Family Medicine

## 2012-08-11 NOTE — Telephone Encounter (Signed)
Med rf °

## 2012-08-12 ENCOUNTER — Encounter: Payer: Self-pay | Admitting: Family Medicine

## 2012-08-17 ENCOUNTER — Telehealth: Payer: Self-pay | Admitting: Family Medicine

## 2012-08-17 MED ORDER — LEVOCETIRIZINE DIHYDROCHLORIDE 5 MG PO TABS: 5.0000 mg | ORAL_TABLET | Freq: Every evening | ORAL | Status: DC

## 2012-08-17 NOTE — Telephone Encounter (Signed)
Rx Refilled  

## 2012-10-01 ENCOUNTER — Other Ambulatory Visit: Payer: Self-pay | Admitting: Orthopedic Surgery

## 2012-10-09 ENCOUNTER — Encounter (HOSPITAL_COMMUNITY): Payer: Self-pay | Admitting: Pharmacy Technician

## 2012-10-12 ENCOUNTER — Other Ambulatory Visit: Payer: Self-pay | Admitting: Orthopedic Surgery

## 2012-10-12 NOTE — H&P (Signed)
[name] is an 62 y.o. male.   Chief Complaint: right shoulder pain HPI: The patient is a 62 year old male being followed for their right shoulder pain. They are 9 months out from when symptoms began. Symptoms reported today include: pain. Current treatment includes: activity modification. The following medication has been used for pain control: none. The patient presents today following subacromial injection 3 weeks ago. The patient reports the injection helped, but is starting to wear off. The patient has reported improvement of their symptoms with: activity modification, Cortisone injections and rest. Jeff reports some relief with the subacromial injection at last visit, 3 weeks ago. He left the office with complete relief of his pain, did have positive response to the lidocaine, had complete relief about 20-30 minutes following the injection. He did get some relief from the steroid as well, stating his shoulder is better than it was before, but still painful and with ROM limitations. He has modified his activity and is avoiding overhead work. He is not currently taking any medications for the shoulder. His pain worsens with activity but he does also have a constant ache at rest. Past Medical History  Diagnosis Date  . Hypertension   . GERD (gastroesophageal reflux disease)     No past surgical history on file.  No family history on file. Social History:  reports that he has never smoked. He does not have any smokeless tobacco history on file. He reports that he does not use illicit drugs. His alcohol history is not on file.  Allergies: No Known Allergies   (Not in a hospital admission)  No results found for this or any previous visit (from the past 48 hour(s)). No results found.  Review of Systems  Constitutional: Negative.   HENT: Negative.   Eyes: Negative.   Respiratory: Negative.   Cardiovascular: Negative.   Gastrointestinal: Negative.   Genitourinary: Negative.    Musculoskeletal: Positive for joint pain.  Skin: Negative.   Neurological: Negative.   Endo/Heme/Allergies: Negative.   Psychiatric/Behavioral: Negative.     There were no vitals taken for this visit. Physical Exam  Constitutional: He is oriented to person, place, and time. He appears well-developed and well-nourished.  HENT:  Head: Normocephalic and atraumatic.  Eyes: Conjunctivae and EOM are normal. Pupils are equal, round, and reactive to light.  Neck: Normal range of motion. Neck supple.  Cardiovascular: Normal rate and regular rhythm.   Respiratory: Effort normal and breath sounds normal.  GI: Soft. Bowel sounds are normal.  Musculoskeletal:  Right Shoulder: Inspection and Palpation:Tenderness- subacromial space tender to palpation. no tenderness to palpation of the AC joint, no tenderness to palpation of the [place] joint and no tenderness to palpation of the clavicle. Swelling- none. Effusion- none. Sensation- intact to light touch. Skin:Color- no ecchymosis and no erythema. ROM: Internal Rotation:AROM- mildly decreased. External Rotation:AROM- full. Flexion:AROM- full. Glenohumeral Abduction:AROM- full and painful. Strength and Tone:Biceps- 5/5. Triceps- 5/5. Abduction- 5/5 and painful. Internal Rotation- 5/5. External Rotation- 5/5. Rotator Cuff- normal. Instability- sulcus sign negative. Impingement- impingement sign positive and secondary impingement sign positive. Deformities/Malalignments/Discrepancies- no deformities noted. Special Testing- Speed's test negative.   Neurological: He is alert and oriented to person, place, and time. He has normal reflexes.  Skin: Skin is warm and dry.    prior xrays and MRI reviewed again with Dr. Beane. Lateral downsloping acromion consistent with impingement. Interstitial tearing supraspinatus and subscapularis. Subacromial bursitis. Mild AC joint DJD.  Assessment/Plan R shoulder impingement syndrome  Pt    with ongoing R shoulder pain, impingement symptoms, slightly decreased ROM (IR), refractory to steroid injection (x2), HEP, stretching, activity modifications, NSAIDs, ongoing about 9 months at this point. At this point, it is reasonable to proceed with surgery as previously discussed. Recommend right shoulder arthroscopy, subacromial decompression, possible RCR, possible coracoacromial ligament release, possible manipulation under anesthesia. Discussed surgery itself as well as risks, complications, alternatives including but not limited to DVT, PE, infx, bleeding, failure of procedure, need for secondary procedure, anesthesia risk, even death. All questions answered and pt desires to proceed. Will obtain pre-op clearance from his PCP Dr. Tom Pickard. Discussed post-op protocols, sling, extended use of sling if RCR as well as PT protocols. Discussed importance of activity modifications to avoid exacerbation. He will follow up 10-14 days post-op for suture removal.  Plan R shoulder arthroscopy, SAD, possible mini-open RCR, possible coracoacromial ligament release, possible manipulation under anesthesia.  BISSELL, JACLYN M.  For Dr. Beane  10/12/2012, 1:44 PM    

## 2012-10-15 ENCOUNTER — Other Ambulatory Visit (HOSPITAL_COMMUNITY): Payer: Self-pay | Admitting: Specialist

## 2012-10-15 NOTE — Progress Notes (Signed)
ekg 01-13-12 epic Abdominal aorta duplex 01-22-12 peic

## 2012-10-15 NOTE — Patient Instructions (Addendum)
22 [name]  10/15/2012   Your procedure is scheduled on: 10-22-2012  Report to [facility] at 530 AM.  Call this number if you have problems the morning of surgery [phone]   Remember:   Do not eat food or drink liquids :After Midnight.     Take these medicines the morning of surgery with A SIP OF WATER: doxazosin, pantaprazole, pravastatin                      Do not wear jewelry, make-up or nail polish.  Do not wear lotions, powders, or perfumes. You may wear deodorant.   Men may shave face and neck.  Do not bring valuables to the hospital.  Contacts, dentures or bridgework may not be worn into surgery.    Patients discharged the day of surgery will not be allowed to drive home.  Name and phone number of your driver: Stanton Kidney (wife) [phone]   Call Birdie Sons RN pre op nurse if needed [phone]    FAILURE TO FOLLOW THESE INSTRUCTIONS MAY RESULT IN THE CANCELLATION OF YOUR SURGERY.  PATIENT SIGNATURE___________________________________________  NURSE SIGNATURE_____________________________________________

## 2012-10-16 ENCOUNTER — Encounter (HOSPITAL_COMMUNITY)
Admission: RE | Admit: 2012-10-16 | Discharge: 2012-10-16 | Disposition: A | Discharge status: Home / Self Care | Payer: 59 | Source: Ambulatory Visit | Attending: Specialist | Admitting: Specialist

## 2012-10-16 ENCOUNTER — Encounter (HOSPITAL_COMMUNITY): Payer: Self-pay

## 2012-10-16 ENCOUNTER — Ambulatory Visit (HOSPITAL_COMMUNITY)
Admission: RE | Admit: 2012-10-16 | Discharge: 2012-10-16 | Disposition: A | Discharge status: Home / Self Care | Payer: 59 | Source: Ambulatory Visit | Attending: Specialist | Admitting: Specialist

## 2012-10-16 DIAGNOSIS — I10 Essential (primary) hypertension: Secondary | ICD-10-CM | POA: Insufficient documentation

## 2012-10-16 DIAGNOSIS — Z01818 Encounter for other preprocedural examination: Secondary | ICD-10-CM | POA: Insufficient documentation

## 2012-10-16 DIAGNOSIS — Z01812 Encounter for preprocedural laboratory examination: Secondary | ICD-10-CM | POA: Insufficient documentation

## 2012-10-16 HISTORY — DX: Malignant (primary) neoplasm, unspecified: C80.1

## 2012-10-16 HISTORY — DX: Allergy, unspecified, initial encounter: T78.40XA

## 2012-10-16 HISTORY — DX: Allergy to seafood: Z91.013

## 2012-10-16 HISTORY — DX: Pure hypercholesterolemia, unspecified: E78.00

## 2012-10-16 HISTORY — DX: Unspecified osteoarthritis, unspecified site: M19.90

## 2012-10-16 LAB — BASIC METABOLIC PANEL
CO2: 25 mEq/L (ref 19–32)
Calcium: 9.1 mg/dL (ref 8.4–10.5)
Creatinine, Ser: 0.93 mg/dL (ref 0.50–1.35)
GFR calc non Af Amer: 89 mL/min — ABNORMAL LOW (ref >90)
Glucose, Bld: 105 mg/dL — ABNORMAL HIGH (ref 70–99)
Sodium: 137 mEq/L (ref 135–145)

## 2012-10-16 LAB — CBC
MCH: 31.5 pg (ref 26.0–34.0)
MCV: 89.6 fL (ref 78.0–100.0)
Platelets: 266 10*3/uL (ref 150–400)
RBC: 4.63 MIL/uL (ref 4.22–5.81)
RDW: 12.4 % (ref 11.5–15.5)

## 2012-10-22 ENCOUNTER — Encounter (HOSPITAL_COMMUNITY): Payer: Self-pay | Admitting: Anesthesiology

## 2012-10-22 ENCOUNTER — Ambulatory Visit (HOSPITAL_COMMUNITY): Payer: 59 | Admitting: Anesthesiology

## 2012-10-22 ENCOUNTER — Ambulatory Visit (HOSPITAL_COMMUNITY)
Admission: RE | Admit: 2012-10-22 | Discharge: 2012-10-22 | Disposition: A | Discharge status: Home / Self Care | Payer: 59 | Source: Ambulatory Visit | Attending: Specialist | Admitting: Specialist

## 2012-10-22 ENCOUNTER — Encounter (HOSPITAL_COMMUNITY)
Admission: RE | Disposition: A | Discharge status: Home / Self Care | Payer: Self-pay | Source: Ambulatory Visit | Attending: Specialist

## 2012-10-22 ENCOUNTER — Encounter (HOSPITAL_COMMUNITY): Payer: Self-pay | Admitting: *Deleted

## 2012-10-22 DIAGNOSIS — M25819 Other specified joint disorders, unspecified shoulder: Secondary | ICD-10-CM | POA: Insufficient documentation

## 2012-10-22 DIAGNOSIS — Z79899 Other long term (current) drug therapy: Secondary | ICD-10-CM | POA: Insufficient documentation

## 2012-10-22 DIAGNOSIS — M7500 Adhesive capsulitis of unspecified shoulder: Secondary | ICD-10-CM | POA: Insufficient documentation

## 2012-10-22 DIAGNOSIS — I10 Essential (primary) hypertension: Secondary | ICD-10-CM | POA: Insufficient documentation

## 2012-10-22 DIAGNOSIS — M719 Bursopathy, unspecified: Secondary | ICD-10-CM | POA: Insufficient documentation

## 2012-10-22 DIAGNOSIS — M67919 Unspecified disorder of synovium and tendon, unspecified shoulder: Secondary | ICD-10-CM | POA: Insufficient documentation

## 2012-10-22 DIAGNOSIS — K219 Gastro-esophageal reflux disease without esophagitis: Secondary | ICD-10-CM | POA: Insufficient documentation

## 2012-10-22 DIAGNOSIS — M7541 Impingement syndrome of right shoulder: Secondary | ICD-10-CM

## 2012-10-22 HISTORY — PX: SHOULDER ARTHROSCOPY WITH SUBACROMIAL DECOMPRESSION AND OPEN ROTATOR C: SHX5688

## 2012-10-22 SURGERY — SHOULDER ARTHROSCOPY WITH SUBACROMIAL DECOMPRESSION AND OPEN ROTATOR CUFF REPAIR, OPEN BICEPS TENDON REPAIR
Anesthesia: General | Site: Shoulder | Laterality: Right | Wound class: Clean

## 2012-10-22 MED ORDER — PHENYLEPHRINE HCL 10 MG/ML IJ SOLN
10.0000 mg | INTRAMUSCULAR | Status: DC | PRN
  Administered 2012-10-22: 30 ug/min via INTRAVENOUS

## 2012-10-22 MED ORDER — DEXAMETHASONE SODIUM PHOSPHATE 4 MG/ML IJ SOLN
INTRAMUSCULAR | Status: DC | PRN
  Administered 2012-10-22: 10 mg via INTRAVENOUS

## 2012-10-22 MED ORDER — BUPIVACAINE-EPINEPHRINE 0.5% -1:200000 IJ SOLN
INTRAMUSCULAR | Status: DC | PRN
  Administered 2012-10-22: 20 mL

## 2012-10-22 MED ORDER — HYDROMORPHONE HCL PF 1 MG/ML IJ SOLN
INTRAMUSCULAR | Status: DC | PRN
  Administered 2012-10-22: 2 mg via INTRAVENOUS

## 2012-10-22 MED ORDER — METHOCARBAMOL 500 MG PO TABS: 500.0000 mg | ORAL_TABLET | Freq: Three times a day (TID) | ORAL | Status: DC | PRN

## 2012-10-22 MED ORDER — OXYCODONE-ACETAMINOPHEN 5-325 MG PO TABS
1.0000 | ORAL_TABLET | Freq: Once | ORAL | Status: AC
  Administered 2012-10-22: 2 via ORAL
  Filled 2012-10-22: qty 2

## 2012-10-22 MED ORDER — LACTATED RINGERS IR SOLN
Status: DC | PRN
  Administered 2012-10-22: 3000 mL

## 2012-10-22 MED ORDER — GLYCOPYRROLATE 0.2 MG/ML IJ SOLN
INTRAMUSCULAR | Status: DC | PRN
  Administered 2012-10-22: 0.6 mg via INTRAVENOUS

## 2012-10-22 MED ORDER — NEOSTIGMINE METHYLSULFATE 1 MG/ML IJ SOLN
INTRAMUSCULAR | Status: DC | PRN
  Administered 2012-10-22: 5 mg via INTRAVENOUS

## 2012-10-22 MED ORDER — OXYCODONE-ACETAMINOPHEN 5-325 MG PO TABS: 1.0000 | ORAL_TABLET | ORAL | Status: DC | PRN

## 2012-10-22 MED ORDER — ONDANSETRON HCL 4 MG/2ML IJ SOLN
INTRAMUSCULAR | Status: DC | PRN
  Administered 2012-10-22: 4 mg via INTRAVENOUS

## 2012-10-22 MED ORDER — PROPOFOL 10 MG/ML IV BOLUS
INTRAVENOUS | Status: DC | PRN
  Administered 2012-10-22: 100 mg via INTRAVENOUS

## 2012-10-22 MED ORDER — LACTATED RINGERS IV SOLN: INTRAVENOUS | Status: DC

## 2012-10-22 MED ORDER — BUPIVACAINE-EPINEPHRINE 0.5% -1:200000 IJ SOLN
INTRAMUSCULAR | Status: AC
  Filled 2012-10-22: qty 1

## 2012-10-22 MED ORDER — LACTATED RINGERS IV SOLN
INTRAVENOUS | Status: DC
  Administered 2012-10-22 (×3): via INTRAVENOUS

## 2012-10-22 MED ORDER — CEFAZOLIN SODIUM-DEXTROSE 2-3 GM-% IV SOLR
INTRAVENOUS | Status: AC
  Filled 2012-10-22: qty 50

## 2012-10-22 MED ORDER — HYDROMORPHONE HCL PF 1 MG/ML IJ SOLN: 0.2500 mg | INTRAMUSCULAR | Status: DC | PRN

## 2012-10-22 MED ORDER — MIDAZOLAM HCL 5 MG/5ML IJ SOLN
INTRAMUSCULAR | Status: DC | PRN
  Administered 2012-10-22 (×2): 1 mg via INTRAVENOUS

## 2012-10-22 MED ORDER — EPINEPHRINE HCL 1 MG/ML IJ SOLN
INTRAMUSCULAR | Status: AC
  Filled 2012-10-22: qty 2

## 2012-10-22 MED ORDER — METOCLOPRAMIDE HCL 5 MG/ML IJ SOLN
INTRAMUSCULAR | Status: DC | PRN
  Administered 2012-10-22: 10 mg via INTRAVENOUS

## 2012-10-22 MED ORDER — EPINEPHRINE HCL 1 MG/ML IJ SOLN
INTRAMUSCULAR | Status: DC | PRN
  Administered 2012-10-22: 1 mg

## 2012-10-22 MED ORDER — EPHEDRINE SULFATE 50 MG/ML IJ SOLN
INTRAMUSCULAR | Status: DC | PRN
  Administered 2012-10-22: 10 mg via INTRAVENOUS
  Administered 2012-10-22: 5 mg via INTRAVENOUS

## 2012-10-22 MED ORDER — CEFAZOLIN SODIUM-DEXTROSE 2-3 GM-% IV SOLR
2.0000 g | INTRAVENOUS | Status: AC
  Administered 2012-10-22: 2 g via INTRAVENOUS

## 2012-10-22 MED ORDER — ROCURONIUM BROMIDE 100 MG/10ML IV SOLN
INTRAVENOUS | Status: DC | PRN
  Administered 2012-10-22: 5 mg via INTRAVENOUS
  Administered 2012-10-22: 10 mg via INTRAVENOUS
  Administered 2012-10-22: 5 mg via INTRAVENOUS
  Administered 2012-10-22: 40 mg via INTRAVENOUS

## 2012-10-22 MED ORDER — PHENYLEPHRINE HCL 10 MG/ML IJ SOLN
INTRAMUSCULAR | Status: DC | PRN
  Administered 2012-10-22: 80 ug via INTRAVENOUS

## 2012-10-22 SURGICAL SUPPLY — 44 items
BLADE CUTTER GATOR 3.5 (BLADE) ×2 IMPLANT
BLADE GREAT WHITE 4.2 (BLADE) ×1 IMPLANT
BLADE SURG SZ11 CARB STEEL (BLADE) ×2 IMPLANT
BUR OVAL 4.0 (BURR) ×2 IMPLANT
CANNULA ACUFO 5X76 (CANNULA) ×4 IMPLANT
CHLORAPREP W/TINT 26ML (MISCELLANEOUS) IMPLANT
CLOTH 2% CHLOROHEXIDINE 3PK (PERSONAL CARE ITEMS) ×2 IMPLANT
CLOTH BEACON ORANGE TIMEOUT ST (SAFETY) ×2 IMPLANT
DECANTER SPIKE VIAL GLASS SM (MISCELLANEOUS) ×2 IMPLANT
DRAPE ORTHO SPLIT 77X108 STRL (DRAPES)
DRAPE POUCH INSTRU U-SHP 10X18 (DRAPES) ×2 IMPLANT
DRAPE SURG ORHT 6 SPLT 77X108 (DRAPES) IMPLANT
DRAPE U-SHAPE 47X51 STRL (DRAPES) ×2 IMPLANT
DURAPREP 26ML APPLICATOR (WOUND CARE) ×2 IMPLANT
DURASEAL SPINE SEALANT 3ML (MISCELLANEOUS) ×2 IMPLANT
ELECT COATED BLADE 2.86 ST (ELECTRODE) ×2 IMPLANT
GLOVE BIOGEL PI IND STRL 7.5 (GLOVE) ×1 IMPLANT
GLOVE BIOGEL PI IND STRL 8 (GLOVE) ×1 IMPLANT
GLOVE BIOGEL PI INDICATOR 7.5 (GLOVE) ×1
GLOVE BIOGEL PI INDICATOR 8 (GLOVE) ×1
GLOVE SURG SS PI 7.5 STRL IVOR (GLOVE) ×2 IMPLANT
GLOVE SURG SS PI 8.0 STRL IVOR (GLOVE) ×4 IMPLANT
GOWN PREVENTION PLUS LG XLONG (DISPOSABLE) ×2 IMPLANT
GOWN PREVENTION PLUS XLARGE (GOWN DISPOSABLE) ×2 IMPLANT
GOWN STRL REIN XL XLG (GOWN DISPOSABLE) ×4 IMPLANT
IV CATH 14GX2 1/4 (CATHETERS) ×2 IMPLANT
KIT BASIN OR (CUSTOM PROCEDURE TRAY) ×2 IMPLANT
MANIFOLD NEPTUNE II (INSTRUMENTS) ×4 IMPLANT
NDL SPNL 18GX3.5 QUINCKE PK (NEEDLE) ×1 IMPLANT
NEEDLE SPNL 18GX3.5 QUINCKE PK (NEEDLE) ×2 IMPLANT
PACK SHOULDER CUSTOM OPM052 (CUSTOM PROCEDURE TRAY) ×2 IMPLANT
POSITIONER SURGICAL ARM (MISCELLANEOUS) ×2 IMPLANT
SET ARTHROSCOPY TUBING (MISCELLANEOUS) ×2
SET ARTHROSCOPY TUBING LN (MISCELLANEOUS) ×1 IMPLANT
SLING ARM IMMOBILIZER LRG (SOFTGOODS) IMPLANT
SLING ARM IMMOBILIZER MED (SOFTGOODS) ×2 IMPLANT
SUCTION FRAZIER TIP 10 FR DISP (SUCTIONS) ×2 IMPLANT
SUT ETHILON 4 0 PS 2 18 (SUTURE) ×2 IMPLANT
SUT NURALON 4 0 TR CR/8 (SUTURE) ×2 IMPLANT
SUT VIC AB 2-0 CT2 27 (SUTURE) IMPLANT
SUT VICRYL 0-0 OS 2 NEEDLE (SUTURE) ×2 IMPLANT
TUBING CONNECTING 10 (TUBING) ×2 IMPLANT
WAND 90 DEG TURBOVAC W/CORD (SURGICAL WAND) ×1 IMPLANT
YANKAUER SUCT BULB TIP NO VENT (SUCTIONS) ×1 IMPLANT

## 2012-10-22 NOTE — H&P (View-Only) (Signed)
[name] is an 62 y.o. male.   Chief Complaint: right shoulder pain HPI: The patient is a 62 year old male being followed for their right shoulder pain. They are 9 months out from when symptoms began. Symptoms reported today include: pain. Current treatment includes: activity modification. The following medication has been used for pain control: none. The patient presents today following subacromial injection 3 weeks ago. The patient reports the injection helped, but is starting to wear off. The patient has reported improvement of their symptoms with: activity modification, Cortisone injections and rest. [name] reports some relief with the subacromial injection at last visit, 3 weeks ago. He left the office with complete relief of his pain, did have positive response to the lidocaine, had complete relief about 20-30 minutes following the injection. He did get some relief from the steroid as well, stating his shoulder is better than it was before, but still painful and with ROM limitations. He has modified his activity and is avoiding overhead work. He is not currently taking any medications for the shoulder. His pain worsens with activity but he does also have a constant ache at rest. Past Medical History  Diagnosis Date  . Hypertension   . GERD (gastroesophageal reflux disease)     No past surgical history on file.  No family history on file. Social History:  reports that he has never smoked. He does not have any smokeless tobacco history on file. He reports that he does not use illicit drugs. His alcohol history is not on file.  Allergies: No Known Allergies   (Not in a hospital admission)  No results found for this or any previous visit (from the past 48 hour(s)). No results found.  Review of Systems  Constitutional: Negative.   HENT: Negative.   Eyes: Negative.   Respiratory: Negative.   Cardiovascular: Negative.   Gastrointestinal: Negative.   Genitourinary: Negative.    Musculoskeletal: Positive for joint pain.  Skin: Negative.   Neurological: Negative.   Endo/Heme/Allergies: Negative.   Psychiatric/Behavioral: Negative.     There were no vitals taken for this visit. Physical Exam  Constitutional: He is oriented to person, place, and time. He appears well-developed and well-nourished.  HENT:  Head: Normocephalic and atraumatic.  Eyes: Conjunctivae and EOM are normal. Pupils are equal, round, and reactive to light.  Neck: Normal range of motion. Neck supple.  Cardiovascular: Normal rate and regular rhythm.   Respiratory: Effort normal and breath sounds normal.  GI: Soft. Bowel sounds are normal.  Musculoskeletal:  Right Shoulder: Inspection and Palpation:Tenderness- subacromial space tender to palpation. no tenderness to palpation of the [facility] joint, no tenderness to palpation of the [place] joint and no tenderness to palpation of the clavicle. Swelling- none. Effusion- none. Sensation- intact to light touch. Skin:Color- no ecchymosis and no erythema. ROM: Internal Rotation:AROM- mildly decreased. External Rotation:AROM- full. Flexion:AROM- full. Glenohumeral Abduction:AROM- full and painful. Strength and Tone:Biceps- 5/5. Triceps- 5/5. Abduction- 5/5 and painful. Internal Rotation- 5/5. External Rotation- 5/5. Rotator Cuff- normal. Instability- sulcus sign negative. Impingement- impingement sign positive and secondary impingement sign positive. Deformities/Malalignments/Discrepancies- no deformities noted. Special Testing- Speed's test negative.   Neurological: He is alert and oriented to person, place, and time. He has normal reflexes.  Skin: Skin is warm and dry.    prior xrays and MRI reviewed again with Dr. Shelle Iron. Lateral downsloping acromion consistent with impingement. Interstitial tearing supraspinatus and subscapularis. Subacromial bursitis. Mild AC joint DJD.  Assessment/Plan R shoulder impingement syndrome  Pt  with ongoing R shoulder pain, impingement symptoms, slightly decreased ROM (IR), refractory to steroid injection (x2), HEP, stretching, activity modifications, NSAIDs, ongoing about 9 months at this point. At this point, it is reasonable to proceed with surgery as previously discussed. Recommend right shoulder arthroscopy, subacromial decompression, possible RCR, possible coracoacromial ligament release, possible manipulation under anesthesia. Discussed surgery itself as well as risks, complications, alternatives including but not limited to DVT, PE, infx, bleeding, failure of procedure, need for secondary procedure, anesthesia risk, even death. All questions answered and pt desires to proceed. Will obtain pre-op clearance from his PCP Dr. Gilmore Laroche. Discussed post-op protocols, sling, extended use of sling if RCR as well as PT protocols. Discussed importance of activity modifications to avoid exacerbation. He will follow up 10-14 days post-op for suture removal.  Plan R shoulder arthroscopy, SAD, possible mini-open RCR, possible coracoacromial ligament release, possible manipulation under anesthesia.  [name] M.  For Dr. Shelle Iron  10/12/2012, 1:44 PM

## 2012-10-22 NOTE — Transfer of Care (Signed)
Immediate Anesthesia Transfer of Care Note  Patient: JANSON LAMAR  Procedure(s) Performed: Procedure(s): RIGHT SHOULDER ARTHROSCOPY WITH SUBACROMIAL DECOMPRESSION AND POSSIBLE ROTATOR CUFF REPAIR, POSSIBLE CORACOACROMIAL LIGAMENT RELEASE AND POSSIBLE MANIPULATION UNDER ANESTHESIA (Right)  Patient Location: PACU  Anesthesia Type:General  Level of Consciousness: awake, alert , patient cooperative and responds to stimulation  Airway & Oxygen Therapy: Patient Spontanous Breathing and Patient connected to face mask  Post-op Assessment: Report given to PACU RN, Post -op Vital signs reviewed and stable and Patient moving all extremities X 4  Post vital signs: Reviewed and stable  Complications: No apparent anesthesia complications

## 2012-10-22 NOTE — Brief Op Note (Signed)
10/22/2012  8:55 AM  PATIENT:  [name]  62 y.o. male  PRE-OPERATIVE DIAGNOSIS:  right shoulder impingment syndrome  POST-OPERATIVE DIAGNOSIS:  right shoulder impingment syndrome  PROCEDURE:  Procedure(s): RIGHT SHOULDER ARTHROSCOPY WITH SUBACROMIAL DECOMPRESSION AND POSSIBLE ROTATOR CUFF REPAIR, POSSIBLE CORACOACROMIAL LIGAMENT RELEASE AND POSSIBLE MANIPULATION UNDER ANESTHESIA (Right)  SURGEON:  Surgeon(s) and Role:    * Javier Docker, MD - Primary  PHYSICIAN ASSISTANT:   ASSISTANTS: Dawayne Cirri ANESTHESIA:   general  EBL:  Total I/O In: 1000 [I.V.:1000] Out: -   BLOOD ADMINISTERED:none  DRAINS: none   LOCAL MEDICATIONS USED:  MARCAINE     SPECIMEN:  No Specimen  DISPOSITION OF SPECIMEN:  N/A  COUNTS:  YES  TOURNIQUET:  * No tourniquets in log *  DICTATION: .Other Dictation: Dictation Number [id number]  PLAN OF CARE: Discharge to home after PACU  PATIENT DISPOSITION:  PACU - hemodynamically stable.   Delay start of Pharmacological VTE agent (>24hrs) due to surgical blood loss or risk of bleeding: no

## 2012-10-22 NOTE — Anesthesia Postprocedure Evaluation (Signed)
  Anesthesia Post-op Note  Patient: [name]  Procedure(s) Performed: Procedure(s) (LRB): RIGHT SHOULDER ARTHROSCOPY WITH SUBACROMIAL DECOMPRESSION AND POSSIBLE ROTATOR CUFF REPAIR, POSSIBLE CORACOACROMIAL LIGAMENT RELEASE AND POSSIBLE MANIPULATION UNDER ANESTHESIA (Right)  Patient Location: PACU  Anesthesia Type: General  Level of Consciousness: awake and alert   Airway and Oxygen Therapy: Patient Spontanous Breathing  Post-op Pain: mild  Post-op Assessment: Post-op Vital signs reviewed, Patient's Cardiovascular Status Stable, Respiratory Function Stable, Patent Airway and No signs of Nausea or vomiting  Last Vitals:  Filed Vitals:   10/22/12 0930  BP: 142/82  Pulse: 79  Temp:   Resp: 14    Post-op Vital Signs: stable   Complications: No apparent anesthesia complications

## 2012-10-22 NOTE — Anesthesia Preprocedure Evaluation (Addendum)
Anesthesia Evaluation  Patient identified by MRN, date of birth, ID band Patient awake    Reviewed: Allergy & Precautions, H&P , NPO status , Patient's Chart, lab work & pertinent test results  Airway Mallampati: II TM Distance: >3 FB Neck ROM: full    Dental no notable dental hx. (+) Teeth Intact and Dental Advisory Given   Pulmonary neg pulmonary ROS,  breath sounds clear to auscultation  Pulmonary exam normal       Cardiovascular Exercise Tolerance: Good hypertension, Pt. on medications Rhythm:regular Rate:Normal     Neuro/Psych negative neurological ROS  negative psych ROS   GI/Hepatic negative GI ROS, Neg liver ROS, GERD-  Medicated and Controlled,  Endo/Other  negative endocrine ROS  Renal/GU negative Renal ROS  negative genitourinary   Musculoskeletal   Abdominal   Peds  Hematology negative hematology ROS (+)   Anesthesia Other Findings   Reproductive/Obstetrics negative OB ROS                          Anesthesia Physical Anesthesia Plan  ASA: II  Anesthesia Plan: General   Post-op Pain Management:    Induction: Intravenous  Airway Management Planned: Oral ETT  Additional Equipment:   Intra-op Plan:   Post-operative Plan: Extubation in OR  Informed Consent: I have reviewed the patients History and Physical, chart, labs and discussed the procedure including the risks, benefits and alternatives for the proposed anesthesia with the patient or authorized representative who has indicated his/her understanding and acceptance.   Dental Advisory Given  Plan Discussed with: CRNA and Surgeon  Anesthesia Plan Comments:         Anesthesia Quick Evaluation  

## 2012-10-22 NOTE — Op Note (Signed)
[name], [name] NO.:  [id number]  MEDICAL RECORD NO.:  [id number]  LOCATION:  [facility]                         FACILITY:  [facility]  PHYSICIAN:  Jene Every, M.D.    DATE OF BIRTH:  03-Mar-1951  DATE OF PROCEDURE:  10/22/2012 DATE OF DISCHARGE:                              OPERATIVE REPORT   PREOPERATIVE DIAGNOSIS:  Adhesive capsulitis, impingement syndrome, partial tear of the rotator cuff.  POSTOPERATIVE DIAGNOSIS:  Adhesive capsulitis, impingement syndrome, partial tear of the rotator cuff.  PROCEDURE PERFORMED: 1. Exam under anesthesia. 2. Right shoulder arthroscopy with subacromial decompression,     acromioplasty and bursectomy. 3. Debridement of partial tear rotator cuff.  ANESTHESIA:  General.  ASSISTANT:  Lanna Poche, PA.  HISTORY:  This is 62 year old male presents with impingement syndrome, refractory conservative treatment, indicated for subacromial decompression, partial tear of the rotator cuff.  Discussed possible open rotator cuff repair and subacromial decompression.  Risk and benefits discussed including bleeding, infection, damage to neurovascular structures, no change in symptoms, worsening symptoms, need for repeat debridement, open repair, etc.  TECHNIQUE:  The patient in supine beach-chair position.  After induction of adequate general anesthesia, 2 g Kefzol, the right shoulder and upper extremity was prepped and draped in the usual sterile fashion.  We examined the patient under anesthesia.  Knee had full range.  Surgical marker was utilized to delineate the acromion, AC joint, and coracoid. Standard posterolateral and anterolateral portals were utilized with incision through the skin only with a #11 blade.  With the arm in 7 and 38 position, we advanced the arthroscopic camera into the glenohumeral joint penetrating atraumatically.  Irrigant was utilized to insufflate the joint.  Sixty-five mmHg lavage of glenohumeral  joint.  Inspection revealed some degenerative changes in the glenohumeral joint.  No fraying of the labrum.  Biceps was intact.  We then moved into the sulcus and was not subluxing.  The rotator cuff tendon was unremarkable. Some mild chondral changes were noted in the glenohumeral joint.  The biceps attachment was intact.  The subscap had some mild fraying but no significant pathology therefore redirected the camera in the subacromial space, and triangulated with a cannula.  The patient relax with some exuberant bursitis in the subacromial space.  We proceeded with a full bursectomy with 4 stitches and shaver anteriorly, posteriorly, and laterally.  The small spur over the anterolateral aspect in the acromion released and morselized the CA ligament with an ArthroWand, shaved the anterolateral aspect of the acromion with the shaver.  We gained significant space in the subacromial region after release of the CA ligament and shaving.  He had a small frayed tear of the supraspinatus near its attachment probed after 10%.  Lightly debrided with good bleeding tissue.  It was thoroughly probed and it was felt that it was only a small partial surface tear.  We inspected the cuff laterally, anteriorly, posteriorly, medially and no other pathology that was noted. We felt significant improvement in the subacromial space and removed the bursa.  We felt pathology was consistent with his preoperative symptoms. I therefore then removed all instrumentation.  Portals were closed with 4-0 nylon simple sutures.  An 0.25% Marcaine with  epinephrine was infiltrated and joint was dressed sterilely without difficulty and transported to the recovery in satisfactory condition.  The patient tolerated the procedure well.  No complications.  Minimal blood loss.     Jene Every, M.D.     Cordelia Pen  D:  10/22/2012  T:  10/22/2012  Job:  [id number]

## 2012-10-22 NOTE — Preoperative (Signed)
Beta Blockers   Reason not to administer Beta Blockers:Not Applicable, not on home BB 

## 2012-10-22 NOTE — Interval H&P Note (Signed)
History and Physical Interval Note:  10/22/2012 7:27 AM  [name]  has presented today for surgery, with the diagnosis of right shoulder impingment syndrome  The various methods of treatment have been discussed with the patient and family. After consideration of risks, benefits and other options for treatment, the patient has consented to  Procedure(s): RIGHT SHOULDER ARTHROSCOPY WITH SUBACROMIAL DECOMPRESSION AND POSSIBLE ROTATOR CUFF REPAIR, POSSIBLE CORACOACROMIAL LIGAMENT RELEASE AND POSSIBLE MANIPULATION UNDER ANESTHESIA (Right) as a surgical intervention .  The patient's history has been reviewed, patient examined, no change in status, stable for surgery.  I have reviewed the patient's chart and labs.  Questions were answered to the patient's satisfaction.     Jhamari Markowicz,Tulio C

## 2012-10-23 ENCOUNTER — Encounter (HOSPITAL_COMMUNITY): Payer: Self-pay | Admitting: Specialist

## 2012-11-10 ENCOUNTER — Ambulatory Visit: Payer: 59 | Attending: Orthopedic Surgery | Admitting: Physical Therapy

## 2012-11-10 DIAGNOSIS — IMO0001 Reserved for inherently not codable concepts without codable children: Secondary | ICD-10-CM | POA: Insufficient documentation

## 2012-11-10 DIAGNOSIS — M25519 Pain in unspecified shoulder: Secondary | ICD-10-CM | POA: Insufficient documentation

## 2012-11-10 DIAGNOSIS — M25619 Stiffness of unspecified shoulder, not elsewhere classified: Secondary | ICD-10-CM | POA: Insufficient documentation

## 2012-11-11 ENCOUNTER — Other Ambulatory Visit: Payer: Self-pay | Admitting: Family Medicine

## 2012-11-12 ENCOUNTER — Ambulatory Visit: Payer: 59 | Admitting: Rehabilitation

## 2012-11-17 ENCOUNTER — Ambulatory Visit: Payer: 59 | Admitting: Physical Therapy

## 2012-11-18 ENCOUNTER — Ambulatory Visit: Payer: 59 | Admitting: Physical Therapy

## 2012-11-24 ENCOUNTER — Ambulatory Visit: Payer: 59 | Admitting: Rehabilitation

## 2012-11-25 ENCOUNTER — Ambulatory Visit: Payer: 59 | Admitting: Physical Therapy

## 2012-11-26 ENCOUNTER — Encounter: Payer: 59 | Admitting: Rehabilitation

## 2012-12-01 ENCOUNTER — Ambulatory Visit: Payer: 59 | Admitting: Physical Therapy

## 2012-12-03 ENCOUNTER — Ambulatory Visit: Payer: 59 | Attending: Orthopedic Surgery | Admitting: Physical Therapy

## 2012-12-03 DIAGNOSIS — IMO0001 Reserved for inherently not codable concepts without codable children: Secondary | ICD-10-CM | POA: Insufficient documentation

## 2012-12-03 DIAGNOSIS — M25519 Pain in unspecified shoulder: Secondary | ICD-10-CM | POA: Insufficient documentation

## 2012-12-03 DIAGNOSIS — M25619 Stiffness of unspecified shoulder, not elsewhere classified: Secondary | ICD-10-CM | POA: Insufficient documentation

## 2012-12-10 ENCOUNTER — Ambulatory Visit: Payer: 59

## 2012-12-15 ENCOUNTER — Ambulatory Visit: Payer: 59 | Admitting: Rehabilitation

## 2012-12-17 ENCOUNTER — Ambulatory Visit: Payer: 59 | Admitting: Rehabilitation

## 2012-12-22 ENCOUNTER — Ambulatory Visit: Payer: 59 | Admitting: Physical Therapy

## 2012-12-24 ENCOUNTER — Ambulatory Visit: Payer: 59 | Admitting: Physical Therapy

## 2012-12-29 ENCOUNTER — Ambulatory Visit: Payer: 59 | Admitting: Rehabilitation

## 2013-01-17 ENCOUNTER — Other Ambulatory Visit: Payer: Self-pay | Admitting: Family Medicine

## 2013-02-01 ENCOUNTER — Encounter: Payer: Self-pay | Admitting: Family Medicine

## 2013-02-04 ENCOUNTER — Encounter: Payer: Self-pay | Admitting: Family Medicine

## 2013-02-11 ENCOUNTER — Other Ambulatory Visit: Payer: Self-pay | Admitting: Family Medicine

## 2013-02-11 MED ORDER — LOSARTAN POTASSIUM 100 MG PO TABS: 100.0000 mg | ORAL_TABLET | Freq: Every morning | ORAL | Status: DC

## 2013-02-11 MED ORDER — DOXAZOSIN MESYLATE 4 MG PO TABS: ORAL_TABLET | ORAL | Status: DC

## 2013-02-11 NOTE — Telephone Encounter (Signed)
.  Rx Refilled and pt has CPE scheduled for 2/15

## 2013-02-12 ENCOUNTER — Ambulatory Visit (INDEPENDENT_AMBULATORY_CARE_PROVIDER_SITE_OTHER): Payer: 59 | Admitting: Family Medicine

## 2013-02-12 ENCOUNTER — Encounter: Payer: Self-pay | Admitting: Family Medicine

## 2013-02-12 VITALS — BP 120/74 | HR 80 | Temp 97.6°F | Resp 16 | Ht 71.5 in | Wt 234.0 lb

## 2013-02-12 DIAGNOSIS — R413 Other amnesia: Secondary | ICD-10-CM

## 2013-02-12 LAB — COMPLETE METABOLIC PANEL WITH GFR
ALT: 24 U/L (ref 0–53)
CO2: 28 mEq/L (ref 19–32)
Calcium: 9.2 mg/dL (ref 8.4–10.5)
Chloride: 104 mEq/L (ref 96–112)
GFR, Est African American: >89 mL/min
Sodium: 141 mEq/L (ref 135–145)
Total Protein: 6.6 g/dL (ref 6.0–8.3)

## 2013-02-12 NOTE — Progress Notes (Signed)
Subjective:    Patient ID: [name], male    DOB: 08/26/1950, 62 y.o.   MRN: [id number]  HPI Patient reports 2 episodes of memory problems. He forgot his son's neurology appointment. This is out of character for him. He also placed a pot in the sink and was feeling of water and then will walk out of the house and left water running overflowing the same making a mass. These 2 events occurred over the last 2 weeks. Prior to that he has had no problems with his memory. He denies any problem remembering names, events, or directions. He has no problem long-term memory. He has not had difficulty at work remembering conversations, person, or directions given verbally to him by his boss. He denies forgetting conversations. Close family members have not noticed a change in personality or short-term memory. He does have a family history of CVA and memory loss and a mother. He denies any alcohol abuse. He denies any neurologic deficits or headaches. Past Medical History  Diagnosis Date  . Hypertension   . GERD (gastroesophageal reflux disease)   . Allergy history, seafood     oysters, mussels  . Allergy     wood sap  . Hypercholesteremia   . Cancer     "skin cancer frozen off"  . Arthritis     neck   Current Outpatient Prescriptions on File Prior to Visit  Medication Sig Dispense Refill  . doxazosin (CARDURA) 4 MG tablet TAKE 1 TABLET (4 MG TOTAL) BY MOUTH DAILY.  90 tablet  1  . hydrochlorothiazide (HYDRODIURIL) 25 MG tablet Take 25 mg by mouth every morning.       Marland Kitchen levocetirizine (XYZAL) 5 MG tablet Take 1 tablet (5 mg total) by mouth every evening.  30 tablet  11  . losartan (COZAAR) 100 MG tablet Take 1 tablet (100 mg total) by mouth every morning.  90 tablet  1  . pantoprazole (PROTONIX) 40 MG tablet Take 40 mg by mouth daily.      . pravastatin (PRAVACHOL) 40 MG tablet TAKE 1 TABLET DAILY  90 tablet  1   No current facility-administered medications on file prior to visit.    Allergies  Allergen Reactions  . Iodine     Topical antiseptic-as a young adult. Don't know reaction   History   Social History  . Marital Status: Married    Spouse Name: N/A    Number of Children: N/A  . Years of Education: N/A   Occupational History  . Not on file.   Social History Main Topics  . Smoking status: Former Smoker -- 1.00 packs/day for 15 years    Types: Cigarettes    Quit date: 03/04/1984  . Smokeless tobacco: Former Neurosurgeon    Types: Chew    Quit date: 03/04/2008  . Alcohol Use: No  . Drug Use: No  . Sexual Activity: Not on file   Other Topics Concern  . Not on file   Social History Narrative  . No narrative on file   No family history on file.    Review of Systems  All other systems reviewed and are negative.       Objective:   Physical Exam  Vitals reviewed. Constitutional: He is oriented to person, place, and time.  Eyes: Conjunctivae and EOM are normal. Pupils are equal, round, and reactive to light. No scleral icterus.  Neck: Neck supple. No JVD present. No tracheal deviation present. No thyromegaly present.  Cardiovascular: Normal  rate, regular rhythm, normal heart sounds and intact distal pulses.  Exam reveals no gallop and no friction rub.   No murmur heard. Pulmonary/Chest: Effort normal and breath sounds normal. No stridor. No respiratory distress. He has no wheezes. He has no rales. He exhibits no tenderness.  Abdominal: Soft. Bowel sounds are normal. He exhibits no distension. There is no tenderness. There is no rebound and no guarding.  Lymphadenopathy:    He has no cervical adenopathy.  Neurological: He is alert and oriented to person, place, and time. He has normal reflexes. He displays normal reflexes. No cranial nerve deficit. He exhibits normal muscle tone. Coordination normal.  Skin: Skin is warm. No rash noted. No erythema. No pallor.          Assessment & Plan:  1. Memory problem Patient's Mini-Mental Status exam  today is 30 over 30. I believe these are 2 isolated events with the patient was in a hurry and was not focusing on the moment. I tried to reassure the patient that his neurologic exam is completely normal. I will check a TSH and a B12 level. We will defer an MRI at present unless the memory problems continued. The memory problems persist I would obtain an MRI to rule out chronic microvascular ischemia given his history of hypertension as well as cerebral atrophy. Meanwhile I did recommend that the patient begin taking aspirin 81 mg by mouth daily. The patient is comfortable with this plan and clinical monitoring at the present time. - COMPLETE METABOLIC PANEL WITH GFR - TSH - Vitamin B12

## 2013-03-19 ENCOUNTER — Other Ambulatory Visit: Payer: 59

## 2013-03-19 DIAGNOSIS — Z0000 Encounter for general adult medical examination without abnormal findings: Secondary | ICD-10-CM

## 2013-03-19 LAB — CBC WITH DIFFERENTIAL/PLATELET
Basophils Absolute: 0.1 10*3/uL (ref 0.0–0.1)
Basophils Relative: 1 % (ref 0–1)
EOS ABS: 0.1 10*3/uL (ref 0.0–0.7)
Eosinophils Relative: 1 % (ref 0–5)
HCT: 46.2 % (ref 39.0–52.0)
HEMOGLOBIN: 15.9 g/dL (ref 13.0–17.0)
LYMPHS ABS: 1 10*3/uL (ref 0.7–4.0)
Lymphocytes Relative: 15 % (ref 12–46)
MCH: 31.4 pg (ref 26.0–34.0)
MCHC: 34.4 g/dL (ref 30.0–36.0)
MCV: 91.3 fL (ref 78.0–100.0)
MONOS PCT: 10 % (ref 3–12)
Monocytes Absolute: 0.6 10*3/uL (ref 0.1–1.0)
NEUTROS ABS: 4.6 10*3/uL (ref 1.7–7.7)
NEUTROS PCT: 73 % (ref 43–77)
PLATELETS: 264 10*3/uL (ref 150–400)
RBC: 5.06 MIL/uL (ref 4.22–5.81)
RDW: 12.9 % (ref 11.5–15.5)
WBC: 6.3 10*3/uL (ref 4.0–10.5)

## 2013-03-19 LAB — LIPID PANEL
CHOLESTEROL: 163 mg/dL (ref 0–200)
HDL: 32 mg/dL — ABNORMAL LOW (ref >39)
LDL CALC: 113 mg/dL — AB (ref 0–99)
Total CHOL/HDL Ratio: 5.1 Ratio
Triglycerides: 92 mg/dL (ref <150)
VLDL: 18 mg/dL (ref 0–40)

## 2013-03-19 LAB — COMPREHENSIVE METABOLIC PANEL
ALBUMIN: 4.4 g/dL (ref 3.5–5.2)
ALK PHOS: 51 U/L (ref 39–117)
ALT: 39 U/L (ref 0–53)
AST: 34 U/L (ref 0–37)
BILIRUBIN TOTAL: 0.8 mg/dL (ref 0.3–1.2)
BUN: 16 mg/dL (ref 6–23)
CO2: 26 meq/L (ref 19–32)
Calcium: 9.6 mg/dL (ref 8.4–10.5)
Chloride: 100 mEq/L (ref 96–112)
Creat: 0.9 mg/dL (ref 0.50–1.35)
GLUCOSE: 96 mg/dL (ref 70–99)
POTASSIUM: 4.2 meq/L (ref 3.5–5.3)
SODIUM: 138 meq/L (ref 135–145)
TOTAL PROTEIN: 6.9 g/dL (ref 6.0–8.3)

## 2013-03-19 LAB — PSA: PSA: 0.63 ng/mL (ref <=4.00)

## 2013-04-06 ENCOUNTER — Encounter: Payer: Self-pay | Admitting: Family Medicine

## 2013-04-06 ENCOUNTER — Ambulatory Visit (INDEPENDENT_AMBULATORY_CARE_PROVIDER_SITE_OTHER): Payer: 59 | Admitting: Family Medicine

## 2013-04-06 VITALS — BP 130/74 | HR 60 | Temp 97.3°F | Resp 16 | Ht 71.5 in | Wt 225.0 lb

## 2013-04-06 DIAGNOSIS — Z0000 Encounter for general adult medical examination without abnormal findings: Secondary | ICD-10-CM

## 2013-04-06 DIAGNOSIS — K635 Polyp of colon: Secondary | ICD-10-CM | POA: Insufficient documentation

## 2013-04-06 NOTE — Progress Notes (Signed)
Subjective:    Patient ID: [name], male    DOB: 1950-08-23, 63 y.o.   MRN: [id number]  HPI Patient is here today for complete physical exam. He recently began a ketogenic diet similar to the Atkins diet.  He experienced 12 pounds of weight loss. Otherwise he is doing well with no changes. His blood pressure is well-controlled today 130/74. Patient had his tetanus shot possibly 3 years ago after he stepped on a nail. He is oriented slingshot today. He is due for Zostavax. He is due for a prostate exam. The patient had a colonoscopy October 2014 significant for colon polyps. It is due again in 5 years. His most recent lab work is listed: Lab on 03/19/2013  Component Date Value Range Status  . WBC 03/19/2013 6.3  4.0 - 10.5 K/uL Final  . RBC 03/19/2013 5.06  4.22 - 5.81 MIL/uL Final  . Hemoglobin 03/19/2013 15.9  13.0 - 17.0 g/dL Final  . HCT 03/19/2013 46.2  39.0 - 52.0 % Final  . MCV 03/19/2013 91.3  78.0 - 100.0 fL Final  . MCH 03/19/2013 31.4  26.0 - 34.0 pg Final  . MCHC 03/19/2013 34.4  30.0 - 36.0 g/dL Final  . RDW 03/19/2013 12.9  11.5 - 15.5 % Final  . Platelets 03/19/2013 264  150 - 400 K/uL Final  . Neutrophils Relative % 03/19/2013 73  43 - 77 % Final  . Neutro Abs 03/19/2013 4.6  1.7 - 7.7 K/uL Final  . Lymphocytes Relative 03/19/2013 15  12 - 46 % Final  . Lymphs Abs 03/19/2013 1.0  0.7 - 4.0 K/uL Final  . Monocytes Relative 03/19/2013 10  3 - 12 % Final  . Monocytes Absolute 03/19/2013 0.6  0.1 - 1.0 K/uL Final  . Eosinophils Relative 03/19/2013 1  0 - 5 % Final  . Eosinophils Absolute 03/19/2013 0.1  0.0 - 0.7 K/uL Final  . Basophils Relative 03/19/2013 1  0 - 1 % Final  . Basophils Absolute 03/19/2013 0.1  0.0 - 0.1 K/uL Final  . Smear Review 03/19/2013 Criteria for review not met   Final  . Sodium 03/19/2013 138  135 - 145 mEq/L Final  . Potassium 03/19/2013 4.2  3.5 - 5.3 mEq/L Final  . Chloride 03/19/2013 100  96 - 112 mEq/L Final  . CO2 03/19/2013 26  19  - 32 mEq/L Final  . Glucose, Bld 03/19/2013 96  70 - 99 mg/dL Final  . BUN 03/19/2013 16  6 - 23 mg/dL Final  . Creat 03/19/2013 0.90  0.50 - 1.35 mg/dL Final  . Total Bilirubin 03/19/2013 0.8  0.3 - 1.2 mg/dL Final  . Alkaline Phosphatase 03/19/2013 51  39 - 117 U/L Final  . AST 03/19/2013 34  0 - 37 U/L Final  . ALT 03/19/2013 39  0 - 53 U/L Final  . Total Protein 03/19/2013 6.9  6.0 - 8.3 g/dL Final  . Albumin 03/19/2013 4.4  3.5 - 5.2 g/dL Final  . Calcium 03/19/2013 9.6  8.4 - 10.5 mg/dL Final  . Cholesterol 03/19/2013 163  0 - 200 mg/dL Final   Comment: ATP III Classification:                                < 200        mg/dL        Desirable  200 - 239     mg/dL        Borderline High                               >= 240        mg/dL        High                             . Triglycerides 03/19/2013 92  <150 mg/dL Final  . HDL 03/19/2013 32* >39 mg/dL Final  . Total CHOL/HDL Ratio 03/19/2013 5.1   Final  . VLDL 03/19/2013 18  0 - 40 mg/dL Final  . LDL Cholesterol 03/19/2013 113* 0 - 99 mg/dL Final   Comment:                            Total Cholesterol/HDL Ratio:CHD Risk                                                 Coronary Heart Disease Risk Table                                                                 Men       Women                                   1/2 Average Risk              3.4        3.3                                       Average Risk              5.0        4.4                                    2X Average Risk              9.6        7.1                                    3X Average Risk             23.4       11.0                          Use the calculated Patient Ratio above and the CHD Risk table  to determine the patient's CHD Risk.                          ATP III Classification (LDL):                                < 100        mg/dL         Optimal                               100 - 129      mg/dL         Near or Above Optimal                               130 - 159     mg/dL         Borderline High                               160 - 189     mg/dL         High                                > 190        mg/dL         Very High                             . PSA 03/19/2013 0.63  <=4.00 ng/mL Final   Comment: Test Methodology: ECLIA PSA (Electrochemiluminescence Immunoassay)                                                     For PSA values from 2.5-4.0, particularly in younger men <60 years                          old, the AUA and NCCN suggest testing for % Free PSA (3515) and                          evaluation of the rate of increase in PSA (PSA velocity).   Past Medical History  Diagnosis Date  . Hypertension   . GERD (gastroesophageal reflux disease)   . Allergy history, seafood     oysters, mussels  . Allergy     wood sap  . Hypercholesteremia   . Cancer     "skin cancer frozen off"  . Arthritis     neck  . Colon polyps    Past Surgical History  Procedure Laterality Date  . Carpal tunnel release Right 1985    elbow  . Shoulder arthroscopy with subacromial decompression and open rotator c Right 10/22/2012    Procedure: RIGHT SHOULDER ARTHROSCOPY WITH SUBACROMIAL DECOMPRESSION AND  ROTATOR CUFF REPAIR,  CORACOACROMIAL LIGAMENT RELEASE AND  MANIPULATION UNDER ANESTHESIA;  Surgeon: Johnn Hai, MD;  Location: [facility];  Service: Orthopedics;  Laterality: Right;   Current Outpatient Prescriptions on File Prior to Visit  Medication Sig Dispense Refill  . doxazosin (CARDURA) 4 MG tablet TAKE 1 TABLET (4 MG TOTAL) BY MOUTH DAILY.  90 tablet  1  . hydrochlorothiazide (HYDRODIURIL) 25 MG tablet Take 25 mg by mouth every morning.       Marland Kitchen levocetirizine (XYZAL) 5 MG tablet Take 1 tablet (5 mg total) by mouth every evening.  30 tablet  11  . losartan (COZAAR) 100 MG tablet Take 1 tablet (100 mg total) by mouth every morning.  90 tablet  1  . pantoprazole (PROTONIX) 40  MG tablet Take 40 mg by mouth daily.      . pravastatin (PRAVACHOL) 40 MG tablet TAKE 1 TABLET DAILY  90 tablet  1   No current facility-administered medications on file prior to visit.   Allergies  Allergen Reactions  . Iodine     Topical antiseptic-as a young adult. Don't know reaction   History   Social History  . Marital Status: Married    Spouse Name: N/A    Number of Children: N/A  . Years of Education: N/A   Occupational History  . Not on file.   Social History Main Topics  . Smoking status: Former Smoker -- 1.00 packs/day for 15 years    Types: Cigarettes    Quit date: 03/04/1984  . Smokeless tobacco: Former Systems developer    Types: Chew    Quit date: 03/04/2008  . Alcohol Use: No  . Drug Use: No  . Sexual Activity: Not on file   Other Topics Concern  . Not on file   Social History Narrative  . No narrative on file   No family history on file.    Review of Systems  All other systems reviewed and are negative.       Objective:   Physical Exam  Vitals reviewed. Constitutional: He is oriented to person, place, and time. He appears well-developed and well-nourished. No distress.  HENT:  Head: Normocephalic and atraumatic.  Right Ear: External ear normal.  Left Ear: External ear normal.  Nose: Nose normal.  Mouth/Throat: Oropharynx is clear and moist. No oropharyngeal exudate.  Eyes: Conjunctivae and EOM are normal. Pupils are equal, round, and reactive to light. Right eye exhibits no discharge. Left eye exhibits no discharge. No scleral icterus.  Neck: Normal range of motion. Neck supple. No JVD present. No tracheal deviation present. No thyromegaly present.  Cardiovascular: Normal rate, regular rhythm, normal heart sounds and intact distal pulses.  Exam reveals no gallop and no friction rub.   No murmur heard. Pulmonary/Chest: Effort normal and breath sounds normal. No respiratory distress. He has no wheezes. He has no rales. He exhibits no tenderness.    Abdominal: Soft. Bowel sounds are normal. He exhibits no distension and no mass. There is no tenderness. There is no rebound and no guarding.  Genitourinary: Rectum normal, prostate normal and penis normal. No penile tenderness.  Musculoskeletal: Normal range of motion. He exhibits no edema and no tenderness.  Lymphadenopathy:    He has no cervical adenopathy.  Neurological: He is alert and oriented to person, place, and time. He has normal reflexes. He displays normal reflexes. No cranial nerve deficit. He exhibits normal muscle tone. Coordination normal.  Skin: Skin is warm. No rash noted. He is not diaphoretic. No erythema. No pallor.  Psychiatric: He has a normal mood and affect. His behavior is normal. Judgment and thought content normal.  Assessment & Plan:  1. Routine general medical examination at a health care facility Patient's physical exam is completely normal. I did recommend he return in 3 months ago we can check a CMP and a fasting lipid panel on his ketogenic diet to make sure that his cholesterol and his liver function tests is not increased. I also recommended Zostavax. The patient will inquire with his insurance first prior to getting the vaccine. I recommended fiber supplementation to avoid constipation on a high-protein diet. I also recommended monitoring sodium consumption to avoid hypotension on this diet. Otherwise his preventative care is up-to-date in his immunizations are up-to-date.

## 2013-05-02 ENCOUNTER — Other Ambulatory Visit: Payer: Self-pay | Admitting: Family Medicine

## 2013-05-30 ENCOUNTER — Other Ambulatory Visit: Payer: Self-pay | Admitting: Family Medicine

## 2013-05-31 ENCOUNTER — Other Ambulatory Visit: Payer: Self-pay | Admitting: Family Medicine

## 2013-05-31 MED ORDER — PANTOPRAZOLE SODIUM 40 MG PO TBEC: 40.0000 mg | DELAYED_RELEASE_TABLET | Freq: Every day | ORAL | Status: DC

## 2013-05-31 NOTE — Telephone Encounter (Signed)
Rx Refilled  

## 2013-06-09 ENCOUNTER — Emergency Department (HOSPITAL_COMMUNITY)
Admission: EM | Admit: 2013-06-09 | Discharge: 2013-06-09 | Disposition: A | Discharge status: Home / Self Care | Payer: 59 | Source: Home / Self Care | Attending: Family Medicine | Admitting: Family Medicine

## 2013-06-09 ENCOUNTER — Encounter (HOSPITAL_COMMUNITY): Payer: Self-pay | Admitting: Emergency Medicine

## 2013-06-09 DIAGNOSIS — L02619 Cutaneous abscess of unspecified foot: Secondary | ICD-10-CM

## 2013-06-09 DIAGNOSIS — L03119 Cellulitis of unspecified part of limb: Secondary | ICD-10-CM

## 2013-06-09 HISTORY — DX: Bee allergy status: Z91.030

## 2013-06-09 MED ORDER — DOXYCYCLINE HYCLATE 100 MG PO CAPS: 100.0000 mg | ORAL_CAPSULE | Freq: Two times a day (BID) | ORAL | Status: DC

## 2013-06-09 NOTE — ED Provider Notes (Signed)
CSN: [id number]     Arrival date & time 06/09/13  2003 History   First MD Initiated Contact with Patient 06/09/13 2034     No chief complaint on file.  (Consider location/radiation/quality/duration/timing/severity/associated sxs/prior Treatment) HPI Comments: States he developed an itching sensation between his right great and second toe earlier today. Went to pharmacy to pick up medication to treat because he suspected he had athletes foot. When he arrived home he discovered a dark, tender swollen area in between right great and second toes. No previous episodes. No fevers   The history is provided by the patient.    Past Medical History  Diagnosis Date  . Hypertension   . GERD (gastroesophageal reflux disease)   . Allergy history, seafood     oysters, mussels  . Allergy     wood sap  . Hypercholesteremia   . Cancer     "skin cancer frozen off"  . Arthritis     neck  . Colon polyps   . Bee sting allergy 1980    hives after 20 yellowjacket stings   Past Surgical History  Procedure Laterality Date  . Carpal tunnel release Right 1985    elbow  . Shoulder arthroscopy with subacromial decompression and open rotator c Right 10/22/2012    Procedure: RIGHT SHOULDER ARTHROSCOPY WITH SUBACROMIAL DECOMPRESSION AND  ROTATOR CUFF REPAIR,  CORACOACROMIAL LIGAMENT RELEASE AND  MANIPULATION UNDER ANESTHESIA;  Surgeon: Johnn Hai, MD;  Location: [facility];  Service: Orthopedics;  Laterality: Right;   Family History  Problem Relation Age of Onset  . Heart failure Mother   . Heart failure Father    History  Substance Use Topics  . Smoking status: Former Smoker -- 1.00 packs/day for 15 years    Types: Cigarettes    Quit date: 03/04/1984  . Smokeless tobacco: Former Systems developer    Types: Chew    Quit date: 03/04/2008  . Alcohol Use: No    Review of Systems  All other systems reviewed and are negative.   Allergies  Iodine  Home Medications   Current Outpatient Rx  Name  Route  Sig   Dispense  Refill  . doxazosin (CARDURA) 4 MG tablet      TAKE 1 TABLET (4 MG TOTAL) BY MOUTH DAILY.   90 tablet   1   . hydrochlorothiazide (HYDRODIURIL) 25 MG tablet      TAKE 1 TABLET DAILY   90 tablet   3   . levocetirizine (XYZAL) 5 MG tablet   Oral   Take 1 tablet (5 mg total) by mouth every evening.   30 tablet   11   . losartan (COZAAR) 100 MG tablet   Oral   Take 1 tablet (100 mg total) by mouth every morning.   90 tablet   1   . pantoprazole (PROTONIX) 40 MG tablet   Oral   Take 1 tablet (40 mg total) by mouth daily.   90 tablet   4   . pravastatin (PRAVACHOL) 40 MG tablet      TAKE 1 TABLET AT BEDTIME   90 tablet   1   . doxycycline (VIBRAMYCIN) 100 MG capsule   Oral   Take 1 capsule (100 mg total) by mouth 2 (two) times daily. X 7 days   14 capsule   0    BP 136/83  Pulse 73  Temp(Src) 98.3 F (36.8 C) (Oral)  Resp 16  SpO2 95% Physical Exam  Nursing note and vitals reviewed.  Constitutional: He is oriented to person, place, and time. He appears well-developed and well-nourished. No distress.  HENT:  Head: Normocephalic and atraumatic.  Eyes: Conjunctivae are normal.  Cardiovascular: Normal rate.   Pulmonary/Chest: Effort normal.  Musculoskeletal: Normal range of motion.  Neurological: He is alert and oriented to person, place, and time.  Skin: Skin is warm and dry.  1x 1 cm dark purple blister between right great and second toes with minimal surrounding erythema. Mildly tender to touch. Small ulcerated center.   Psychiatric: He has a normal mood and affect. His behavior is normal.    ED Course  INCISION AND DRAINAGE Date/Time: 06/09/2013 9:58 PM Performed by: Griselda Miner LEE Authorized by: Griselda Miner LEE Consent: Verbal consent obtained. Risks and benefits: risks, benefits and alternatives were discussed Consent given by: patient Patient understanding: patient states understanding of the procedure being performed Patient  identity confirmed: verbally with patient and arm band Time out: Immediately prior to procedure a "time out" was called to verify the correct patient, procedure, equipment, support staff and site/side marked as required. Type: bulla Body area: lower extremity Location details: right foot Local anesthetic: topical anesthetic Patient sedated: no Scalpel size: 11 Incision type: single straight Complexity: simple Drainage: serous Drainage amount: scant Wound treatment: wound left open Packing material: none Patient tolerance: Patient tolerated the procedure well with no immediate complications.   (including critical care time) Labs Review Labs Reviewed - No data to display Imaging Review No results found.   MDM   1. Abscess of foot   Lesion unroofed and produced small amount of serous drainage. Educated patient about wound care at home and close follow up with his PCP. Advised him to call PCP for follow up evaluation appointment to be seen on 06-11-2013. Doxycycline as prescribed. I advised patient that given hyperpigmentation of area, I recommended he follow closely with his PCP.    [place], [place] 06/09/13 2159

## 2013-06-09 NOTE — Discharge Instructions (Signed)
Abscess  Care After  An abscess (also called a boil or furuncle) is an infected area that contains a collection of pus. Signs and symptoms of an abscess include pain, tenderness, redness, or hardness, or you may feel a moveable soft area under your skin. An abscess can occur anywhere in the body. The infection may spread to surrounding tissues causing cellulitis. A cut (incision) by the surgeon was made over your abscess and the pus was drained out. Gauze may have been packed into the space to provide a drain that will allow the cavity to heal from the inside outwards. The boil may be painful for 5 to 7 days. Most people with a boil do not have high fevers. Your abscess, if seen early, may not have localized, and may not have been lanced. If not, another appointment may be required for this if it does not get better on its own or with medications.  HOME CARE INSTRUCTIONS   · Only take over-the-counter or prescription medicines for pain, discomfort, or fever as directed by your caregiver.  · When you bathe, soak and then remove gauze or iodoform packs at least daily or as directed by your caregiver. You may then wash the wound gently with mild soapy water. Repack with gauze or do as your caregiver directs.  SEEK IMMEDIATE MEDICAL CARE IF:   · You develop increased pain, swelling, redness, drainage, or bleeding in the wound site.  · You develop signs of generalized infection including muscle aches, chills, fever, or a general ill feeling.  · An oral temperature above 102° F (38.9° C) develops, not controlled by medication.  See your caregiver for a recheck if you develop any of the symptoms described above. If medications (antibiotics) were prescribed, take them as directed.  Document Released: 09/06/2004 Document Revised: 05/13/2011 Document Reviewed: 05/04/2007  [employer]® Patient Information ©2014 [employer].

## 2013-06-09 NOTE — ED Notes (Signed)
C/o itching between R great and 2nd toe.  He thought it was athletes foot.  Looked at it tonight and saw a black spot between his toes.  Never saw a spider or insect.  It was sore when he touched it.

## 2013-06-10 NOTE — ED Provider Notes (Signed)
Medical screening examination/treatment/procedure(s) were performed by resident physician or non-physician practitioner and as supervising physician I was immediately available for consultation/collaboration.   Jerard Bays DOUGLAS MD.   Fredrick Dray D Quincie Haroon, MD 06/10/13 1436 

## 2013-06-11 ENCOUNTER — Encounter: Payer: Self-pay | Admitting: Family Medicine

## 2013-06-11 ENCOUNTER — Ambulatory Visit (INDEPENDENT_AMBULATORY_CARE_PROVIDER_SITE_OTHER): Payer: 59 | Admitting: Family Medicine

## 2013-06-11 VITALS — BP 98/60 | HR 82 | Temp 97.7°F | Resp 16 | Ht 71.5 in | Wt 206.0 lb

## 2013-06-11 DIAGNOSIS — S91301A Unspecified open wound, right foot, initial encounter: Secondary | ICD-10-CM

## 2013-06-11 DIAGNOSIS — S91309A Unspecified open wound, unspecified foot, initial encounter: Secondary | ICD-10-CM

## 2013-06-11 NOTE — Progress Notes (Signed)
Subjective:    Patient ID: [name], male    DOB: [birth year], 63 y.o.   MRN: [id number]  HPI  Patient went to the hospital on Wednesday with a purple, blister-like fluctuant nodule between his right first and second toes in the webspace. It appeared to be a blood blister based on pictures he brought. The patient underwent I&D in the emergency room. Per the notes in the emergency room, only a trace amount serous fluid was obtained. I do not see a wound culture. The patient was placed on doxycycline for possible abscess. Today in clinic there is a 1 cm superficial opening between the first and second toes on the right foot. Healthy skin is in the base of the wound and the superficial skin is peeling away. There is no erythema. There is no drainage. There is no evidence of cellulitis. Past Medical History  Diagnosis Date  . Hypertension   . GERD (gastroesophageal reflux disease)   . Allergy history, seafood     oysters, mussels  . Allergy     wood sap  . Hypercholesteremia   . Cancer     "skin cancer frozen off"  . Arthritis     neck  . Colon polyps   . Bee sting allergy 1980    hives after 20 yellowjacket stings   Current Outpatient Prescriptions on File Prior to Visit  Medication Sig Dispense Refill  . doxazosin (CARDURA) 4 MG tablet TAKE 1 TABLET (4 MG TOTAL) BY MOUTH DAILY.  90 tablet  1  . doxycycline (VIBRAMYCIN) 100 MG capsule Take 1 capsule (100 mg total) by mouth 2 (two) times daily. X 7 days  14 capsule  0  . hydrochlorothiazide (HYDRODIURIL) 25 MG tablet TAKE 1 TABLET DAILY  90 tablet  3  . levocetirizine (XYZAL) 5 MG tablet Take 1 tablet (5 mg total) by mouth every evening.  30 tablet  11  . losartan (COZAAR) 100 MG tablet Take 1 tablet (100 mg total) by mouth every morning.  90 tablet  1  . pantoprazole (PROTONIX) 40 MG tablet Take 1 tablet (40 mg total) by mouth daily.  90 tablet  4  . pravastatin (PRAVACHOL) 40 MG tablet TAKE 1 TABLET AT BEDTIME  90 tablet  1     No current facility-administered medications on file prior to visit.   Allergies  Allergen Reactions  . Iodine     Topical antiseptic-as a young adult. Don't know reaction   History   Social History  . Marital Status: Married    Spouse Name: N/A    Number of Children: N/A  . Years of Education: N/A   Occupational History  . Not on file.   Social History Main Topics  . Smoking status: Former Smoker -- 1.00 packs/day for 15 years    Types: Cigarettes    Quit date: 03/04/1984  . Smokeless tobacco: Former Systems developer    Types: Chew    Quit date: 03/04/2008  . Alcohol Use: No  . Drug Use: No  . Sexual Activity: Not on file   Other Topics Concern  . Not on file   Social History Narrative  . No narrative on file     Review of Systems  All other systems reviewed and are negative.      Objective:   Physical Exam  Vitals reviewed. Cardiovascular: Normal rate, regular rhythm and normal heart sounds.   Pulmonary/Chest: Effort normal.  Skin: Skin is warm. No rash noted. No erythema. No  pallor.  1 cm superficial sore in between the first and second toes on the right foot. There is no evidence of cellulitis or abscess today. Wound seems to be healing well the        Assessment & Plan:  1. Open wound of right foot The patient may have had a blood blister. In any respect there does not appear to be any necrotic tissue or abscess formation at the present time. There does not appear to be any cellulitis. I recommended the patient keep his foot elevated and dry. I recommend he cover the wound with Neosporin on a daily basis. I recommended he finish the doxycycline. Follow up next week if not completely healed or sooner if worse.

## 2013-07-01 ENCOUNTER — Encounter (HOSPITAL_COMMUNITY): Payer: Self-pay | Admitting: Specialist

## 2013-07-01 NOTE — OR Nursing (Signed)
error 

## 2013-07-10 ENCOUNTER — Other Ambulatory Visit: Payer: Self-pay | Admitting: Family Medicine

## 2013-07-12 ENCOUNTER — Other Ambulatory Visit: Payer: Self-pay | Admitting: Family Medicine

## 2013-07-14 ENCOUNTER — Ambulatory Visit (INDEPENDENT_AMBULATORY_CARE_PROVIDER_SITE_OTHER): Payer: 59 | Admitting: Physician Assistant

## 2013-07-14 ENCOUNTER — Encounter: Payer: Self-pay | Admitting: Physician Assistant

## 2013-07-14 VITALS — BP 132/74 | HR 60 | Temp 97.8°F | Resp 18 | Wt 198.0 lb

## 2013-07-14 DIAGNOSIS — W57XXXA Bitten or stung by nonvenomous insect and other nonvenomous arthropods, initial encounter: Secondary | ICD-10-CM

## 2013-07-14 DIAGNOSIS — T148 Other injury of unspecified body region: Secondary | ICD-10-CM

## 2013-07-14 MED ORDER — DOXYCYCLINE HYCLATE 100 MG PO TABS: 100.0000 mg | ORAL_TABLET | Freq: Two times a day (BID) | ORAL | Status: DC

## 2013-07-14 NOTE — Progress Notes (Signed)
Patient ID: [name] MRN: [id number], DOB: 1950/08/27, 63 y.o. Date of Encounter: 07/14/2013, 5:11 PM    Chief Complaint:  Chief Complaint  Patient presents with  . tick bite upper inner left thigh    area red and hard     HPI: 63 y.o. year old white male reports that just yesterday at 8:00 PM he noticed that his left inner thigh with itching. He looked at the area and saw a tick so he removed it. He says that at that time the area was slightly pink and slightly raised. However, he says this morning the area of pink color was much larger and this morning the area was firm. Says that last night it was not firm/hard at all. He says that he is leaving to go out of town tomorrow so he thought he better come get it checked before going out-of-town.  Says he has had no other areas of rash on his skin. Has had no fevers. No myalgias. No malaise. No headache.     Home Meds: See attached medication section for any medications that were entered at today's visit. The computer does not put those onto this list.The following list is a list of meds entered prior to today's visit.   Current Outpatient Prescriptions on File Prior to Visit  Medication Sig Dispense Refill  . hydrochlorothiazide (HYDRODIURIL) 25 MG tablet TAKE 1 TABLET DAILY  90 tablet  3  . levocetirizine (XYZAL) 5 MG tablet Take 1 tablet (5 mg total) by mouth every evening.  30 tablet  11  . losartan (COZAAR) 100 MG tablet TAKE 1 TABLET EVERY MORNING  90 tablet  3  . pantoprazole (PROTONIX) 40 MG tablet Take 1 tablet (40 mg total) by mouth daily.  90 tablet  4  . pravastatin (PRAVACHOL) 40 MG tablet TAKE 1 TABLET AT BEDTIME  90 tablet  1   No current facility-administered medications on file prior to visit.    Allergies:  Allergies  Allergen Reactions  . Iodine     Topical antiseptic-as a young adult. Don't know reaction      Review of Systems: See HPI for pertinent ROS. All other ROS negative.    Physical  Exam: Blood pressure 132/74, pulse 60, temperature 97.8 F (36.6 C), temperature source Oral, resp. rate 18, weight 198 lb (89.812 kg)., Body mass index is 27.23 kg/(m^2). General:  WNWD WM. Appears in no acute distress. Neck: Supple. No thyromegaly. No lymphadenopathy. Lungs: Clear bilaterally to auscultation without wheezes, rales, or rhonchi. Breathing is unlabored. Heart: Regular rhythm. No murmurs, rubs, or gallops. Msk:  Strength and tone normal for age. Extremities/Skin: Warm and dry. Left Inner Thigh, Just Below Groin: There is a 1 inch x 1/2 inch area of firmness/ moderate erythema.  Area not fluctuant at all. Not c/w abscess. No drainage from site.  No other rashes on any other area of skin.  Neuro: Alert and oriented X 3. Moves all extremities spontaneously. Gait is normal. CNII-XII grossly in tact. Psych:  Responds to questions appropriately with a normal affect.     ASSESSMENT AND PLAN:  63 y.o. year old male with  1. Tick bite Discussed with him that the current findings could be secondary to an allergic type reaction. Discussed that to cover this I recommended him taking oral Benadryl as well as applying Benadryl cream. Also recommend he go ahead and take doxycycline--discussed this would cover for skin infection as well as Lyme, RMSF.  If site  worsens at all or does not resolve then seek follow up. - doxycycline (VIBRA-TABS) 100 MG tablet; Take 1 tablet (100 mg total) by mouth 2 (two) times daily.  Dispense: 20 tablet; Refill: 0   Signed, [address] [place], [place], [facility] 07/14/2013 5:11 PM

## 2013-07-24 ENCOUNTER — Other Ambulatory Visit: Payer: Self-pay | Admitting: Family Medicine

## 2013-09-16 ENCOUNTER — Other Ambulatory Visit: Payer: Self-pay | Admitting: Family Medicine

## 2013-09-24 ENCOUNTER — Encounter: Payer: Self-pay | Admitting: Family Medicine

## 2013-09-24 ENCOUNTER — Ambulatory Visit (INDEPENDENT_AMBULATORY_CARE_PROVIDER_SITE_OTHER): Payer: 59 | Admitting: Family Medicine

## 2013-09-24 VITALS — BP 142/88 | HR 76 | Temp 98.2°F | Resp 14 | Ht 71.5 in | Wt 180.0 lb

## 2013-09-24 DIAGNOSIS — T7840XA Allergy, unspecified, initial encounter: Secondary | ICD-10-CM

## 2013-09-24 MED ORDER — PREDNISONE 20 MG PO TABS
ORAL_TABLET | ORAL | Status: DC
Start: 2013-09-24 — End: 2013-10-08

## 2013-09-24 NOTE — Progress Notes (Signed)
   Subjective:    Patient ID: [name], male    DOB: [birth year], 63 y.o.   MRN: [id number]  HPI  Patient was stung on the left wrist by a yellow jacket earlier this week. He now has extensive swelling erythema and tenderness extending from his left wrist up his entire left forearm. He is been taking 50 mg of Benadryl every 4 hours without improvement. He denies any difficulty breathing or swallowing. He denies any wheezing. Past Medical History  Diagnosis Date  . Hypertension   . GERD (gastroesophageal reflux disease)   . Allergy history, seafood     oysters, mussels  . Allergy     wood sap  . Hypercholesteremia   . Cancer     "skin cancer frozen off"  . Arthritis     neck  . Colon polyps   . Bee sting allergy 1980    hives after 20 yellowjacket stings   Current Outpatient Prescriptions on File Prior to Visit  Medication Sig Dispense Refill  . hydrochlorothiazide (HYDRODIURIL) 25 MG tablet TAKE 1 TABLET DAILY  90 tablet  3  . levocetirizine (XYZAL) 5 MG tablet TAKE 1 TABLET EVERY EVENING  30 tablet  11  . losartan (COZAAR) 100 MG tablet TAKE 1 TABLET EVERY MORNING  90 tablet  3  . pantoprazole (PROTONIX) 40 MG tablet Take 1 tablet (40 mg total) by mouth daily.  90 tablet  4  . pravastatin (PRAVACHOL) 40 MG tablet TAKE 1 TABLET AT BEDTIME  90 tablet  1   No current facility-administered medications on file prior to visit.   Allergies  Allergen Reactions  . Iodine     Topical antiseptic-as a young adult. Don't know reaction   History   Social History  . Marital Status: Married    Spouse Name: N/A    Number of Children: N/A  . Years of Education: N/A   Occupational History  . Not on file.   Social History Main Topics  . Smoking status: Former Smoker -- 1.00 packs/day for 15 years    Types: Cigarettes    Quit date: 03/04/1984  . Smokeless tobacco: Former Systems developer    Types: Chew    Quit date: 03/04/2008  . Alcohol Use: No  . Drug Use: No  . Sexual Activity:  Not on file   Other Topics Concern  . Not on file   Social History Narrative  . No narrative on file     Review of Systems  All other systems reviewed and are negative.      Objective:   Physical Exam  Vitals reviewed. Cardiovascular: Normal rate, regular rhythm and normal heart sounds.   No murmur heard. Pulmonary/Chest: Effort normal and breath sounds normal. No stridor. No respiratory distress. He has no wheezes. He has no rales.  Abdominal: Soft.  Musculoskeletal: He exhibits edema.  Skin: Rash noted. There is erythema.          Assessment & Plan:  1. Allergic reaction, initial encounter Continue Benadryl 50 mg every 4 hours. Add prednisone taper pack. Recheck Monday if no better or sooner if worse. - predniSONE (DELTASONE) 20 MG tablet; 3 tabs poqday 1-2, 2 tabs poqday 3-4, 1 tab poqday 5-6  Dispense: 12 tablet; Refill: 0

## 2013-10-08 ENCOUNTER — Encounter: Payer: Self-pay | Admitting: Family Medicine

## 2013-10-08 ENCOUNTER — Ambulatory Visit (INDEPENDENT_AMBULATORY_CARE_PROVIDER_SITE_OTHER): Payer: 59 | Admitting: Family Medicine

## 2013-10-08 VITALS — BP 132/78 | HR 72 | Temp 97.6°F | Resp 12 | Ht 70.0 in | Wt 175.0 lb

## 2013-10-08 DIAGNOSIS — L259 Unspecified contact dermatitis, unspecified cause: Secondary | ICD-10-CM

## 2013-10-08 DIAGNOSIS — T7840XA Allergy, unspecified, initial encounter: Secondary | ICD-10-CM

## 2013-10-08 DIAGNOSIS — L239 Allergic contact dermatitis, unspecified cause: Secondary | ICD-10-CM

## 2013-10-08 MED ORDER — TRIAMCINOLONE ACETONIDE 0.1 % EX CREA: 1.0000 "application " | TOPICAL_CREAM | Freq: Two times a day (BID) | CUTANEOUS | Status: DC

## 2013-10-08 MED ORDER — METHYLPREDNISOLONE ACETATE 40 MG/ML IJ SUSP
40.0000 mg | Freq: Once | INTRAMUSCULAR | Status: AC
  Administered 2013-10-08: 40 mg via INTRAMUSCULAR

## 2013-10-08 MED ORDER — PREDNISONE 20 MG PO TABS: ORAL_TABLET | ORAL | Status: DC

## 2013-10-08 NOTE — Progress Notes (Signed)
Patient ID: [name], male   DOB: June 19, 1950, 63 y.o.   MRN: [id number]   Subjective:    Patient ID: [name], male    DOB: 07/30/1950, 63 y.o.   MRN: [id number]  Patient presents for Rash  Patient here with rash. He was seen about 2 weeks ago after he sustained a yellow jacket sting at that time he had significant swelling of his hands and wrists. He was given prednisone taper which he did well with however towards the end of the prednisone taper he then broke out in a red itchy rash which started on his right arm near the flexural regions and is thus bread to his legs near his calf as well as to spot and now popped up on bilateral groins. He denies any change in his lotion soap he denies any new contacts on the scan. He denies any further bug bites. He denies any bed bugs or any exposure to any family members with rash. He states that the itching is very significant he has been using Benadryl with no improvement and feels like he describes all over and it is getting worse   Review Of Systems:  GEN- denies fatigue, fever, weight loss,weakness, recent illness HEENT- denies eye drainage, change in vision, nasal discharge, CVS- denies chest pain, palpitations RESP- denies SOB, cough, wheeze Neuro- denies headache, dizziness, syncope, seizure activity       Objective:    BP 132/78  Pulse 72  Temp(Src) 97.6 F (36.4 C) (Oral)  Resp 12  Ht 5\' 10"  (1.778 m)  Wt 175 lb (79.379 kg)  BMI 25.11 kg/m2 GEN- NAD, alert and oriented x3 HEENT- a, MMM, oropharynx clear Skin- erythematous maculopapular lesions, +blanching, on arm, forearm anticubital fossa, few lesions on right calf, left groin, + excoriations, no pustules seen        Assessment & Plan:      Problem List Items Addressed This Visit   None    Visit Diagnoses   Allergic reaction, initial encounter    -  Primary    Relevant Medications       predniSONE (DELTASONE) tablet       methylPREDNISolone acetate  (DEPO-MEDROL) injection 40 mg (Completed)    Allergic dermatitis        Depo Medrol injection given, topical TAC, prednisone taper, if no improvement will schedule with dermatology, Unknown cause, doubt related to initial bee sting based on timing of rash    Relevant Medications       methylPREDNISolone acetate (DEPO-MEDROL) injection 40 mg (Completed)       Note: This dictation was prepared with Dragon dictation along with smaller phrase technology. Any transcriptional errors that result from this process are unintentional.

## 2013-10-08 NOTE — Patient Instructions (Signed)
Use aveeno oatmeal bath Apply the Triamcinolone ointment twice a day  Start prendisone tomorrow F/U as needed or with dermatology

## 2013-10-22 ENCOUNTER — Encounter: Payer: Self-pay | Admitting: Family Medicine

## 2013-10-22 ENCOUNTER — Ambulatory Visit (INDEPENDENT_AMBULATORY_CARE_PROVIDER_SITE_OTHER): Payer: 59 | Admitting: Family Medicine

## 2013-10-22 VITALS — BP 130/74 | HR 80 | Temp 97.8°F | Resp 14 | Ht 71.5 in | Wt 172.0 lb

## 2013-10-22 DIAGNOSIS — T788XXS Other adverse effects, not elsewhere classified, sequela: Secondary | ICD-10-CM

## 2013-10-22 DIAGNOSIS — T7840XS Allergy, unspecified, sequela: Secondary | ICD-10-CM

## 2013-10-22 DIAGNOSIS — T7589XS Other specified effects of external causes, sequela: Secondary | ICD-10-CM

## 2013-10-22 MED ORDER — PREDNISONE 20 MG PO TABS: ORAL_TABLET | ORAL | Status: DC

## 2013-10-22 NOTE — Progress Notes (Signed)
Subjective:    Patient ID: [name], male    DOB: 13-Feb-1951, 63 y.o.   MRN: [id number]  HPI On August 7, the patient had allergic reaction to tree sat. He was treated with prednisone for 6 days. The rash went away immediately. However after discontinuing the prednisone the rash has returned he is now on both posterior thighs as well as his anterior right thigh. It is characterized by 2-3 mm erythematous papules. It is extremely pruritic. There are large clusters on his posterior thighs and a small cluster on his anterior right thigh. Past Medical History  Diagnosis Date  . Hypertension   . GERD (gastroesophageal reflux disease)   . Allergy history, seafood     oysters, mussels  . Allergy     wood sap  . Hypercholesteremia   . Cancer     "skin cancer frozen off"  . Arthritis     neck  . Colon polyps   . Bee sting allergy 1980    hives after 20 yellowjacket stings   Past Surgical History  Procedure Laterality Date  . Carpal tunnel release Right 1985    elbow  . Shoulder arthroscopy with subacromial decompression and open rotator c Right 10/22/2012    Procedure: RIGHT SHOULDER ARTHROSCOPY WITH SUBACROMIAL DECOMPRESSION AND  ROTATOR CUFF REPAIR,  CORACOACROMIAL LIGAMENT RELEASE AND  MANIPULATION UNDER ANESTHESIA;  Surgeon: Johnn Hai, MD;  Location: [facility];  Service: Orthopedics;  Laterality: Right;   Current Outpatient Prescriptions on File Prior to Visit  Medication Sig Dispense Refill  . hydrochlorothiazide (HYDRODIURIL) 25 MG tablet TAKE 1 TABLET DAILY  90 tablet  3  . levocetirizine (XYZAL) 5 MG tablet TAKE 1 TABLET EVERY EVENING  30 tablet  11  . losartan (COZAAR) 100 MG tablet TAKE 1 TABLET EVERY MORNING  90 tablet  3  . pantoprazole (PROTONIX) 40 MG tablet Take 1 tablet (40 mg total) by mouth daily.  90 tablet  4  . pravastatin (PRAVACHOL) 40 MG tablet TAKE 1 TABLET AT BEDTIME  90 tablet  1  . triamcinolone cream (KENALOG) 0.1 % Apply 1 application topically  2 (two) times daily.  30 g  0   No current facility-administered medications on file prior to visit.   Allergies  Allergen Reactions  . Iodine     Topical antiseptic-as a young adult. Don't know reaction   History   Social History  . Marital Status: Married    Spouse Name: N/A    Number of Children: N/A  . Years of Education: N/A   Occupational History  . Not on file.   Social History Main Topics  . Smoking status: Former Smoker -- 1.00 packs/day for 15 years    Types: Cigarettes    Quit date: 03/04/1984  . Smokeless tobacco: Former Systems developer    Types: Chew    Quit date: 03/04/2008  . Alcohol Use: No  . Drug Use: No  . Sexual Activity: Not on file   Other Topics Concern  . Not on file   Social History Narrative  . No narrative on file      Review of Systems  All other systems reviewed and are negative.      Objective:   Physical Exam  Vitals reviewed. Cardiovascular: Normal rate and regular rhythm.   Pulmonary/Chest: Effort normal and breath sounds normal.  Skin: Rash noted. There is erythema.          Assessment & Plan:  Allergic reaction, sequela -  Plan: predniSONE (DELTASONE) 20 MG tablet  1 additional prednisone taper pack. If the rash returns after completion of the second prednisone taper pack, I would treat the patient for more chronic systemic allergic urticaria by adding Singulair to his xyzal or offering allergy testing.

## 2013-10-29 ENCOUNTER — Other Ambulatory Visit: Payer: Self-pay | Admitting: Family Medicine

## 2013-11-01 ENCOUNTER — Telehealth: Payer: Self-pay | Admitting: Family Medicine

## 2013-11-01 ENCOUNTER — Other Ambulatory Visit: Payer: Self-pay | Admitting: Family Medicine

## 2013-11-01 MED ORDER — MONTELUKAST SODIUM 10 MG PO TABS: 10.0000 mg | ORAL_TABLET | Freq: Every day | ORAL | Status: DC

## 2013-11-01 NOTE — Telephone Encounter (Signed)
Pt was needing something stating that he is miserable so I called pharmacy and got them to run Accolate 20mg  bid and his insurance will cover it so Per WTP ok to call into pharmacy. Med changed and called into pharm and pt aware.

## 2013-11-01 NOTE — Telephone Encounter (Signed)
PA submitted through [email or web address]  

## 2013-11-01 NOTE — Telephone Encounter (Signed)
Med sent to pharm and pt aware per vm

## 2013-11-01 NOTE — Telephone Encounter (Signed)
singulair 10 mg poqday

## 2013-11-01 NOTE — Telephone Encounter (Signed)
Patient is calling to say his allergic rxn is still going on and dr pickard said the next step would be rx for singular and would like to know if we could call this in   [phone]

## 2013-11-14 IMAGING — US US SOFT TISSUE HEAD/NECK
1 series · 14 of 21 positions shown · non-contrast
Comparison: None.

CLINICAL DATA: Left submandibular palpable knot.  The patient
reports this has been present intermittently for 25 years.

ULTRASOUND OF HEAD/NECK SOFT TISSUES
TECHNIQUE: Ultrasound examination of the head and neck soft
tissues was performed in the area of clinical concern.

[Series 1: us soft tissue head/neck · 0.08mm/px · 14 of 21 slices shown]
[im 1/21]
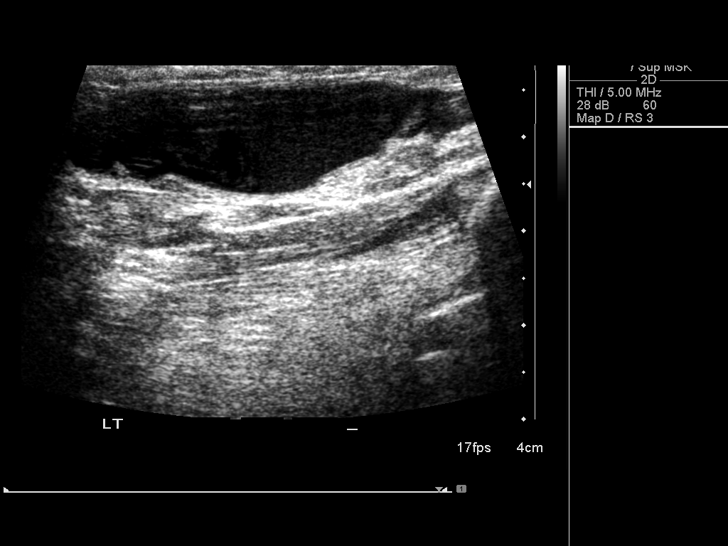
[im 3/21]
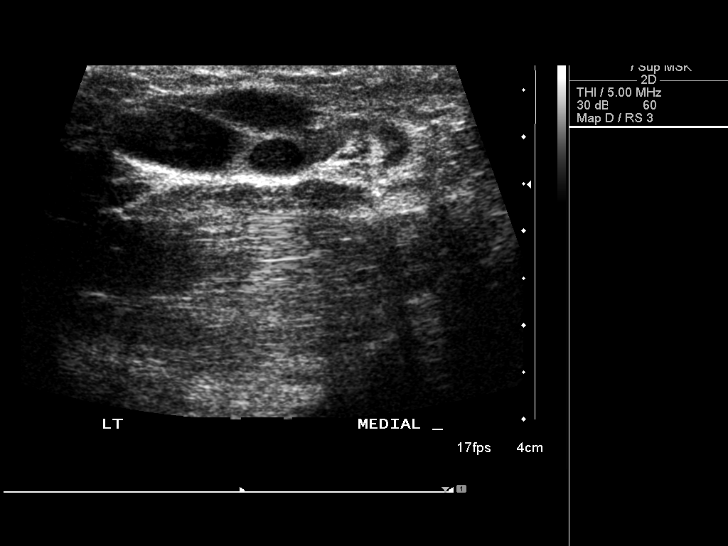
[im 4/21]
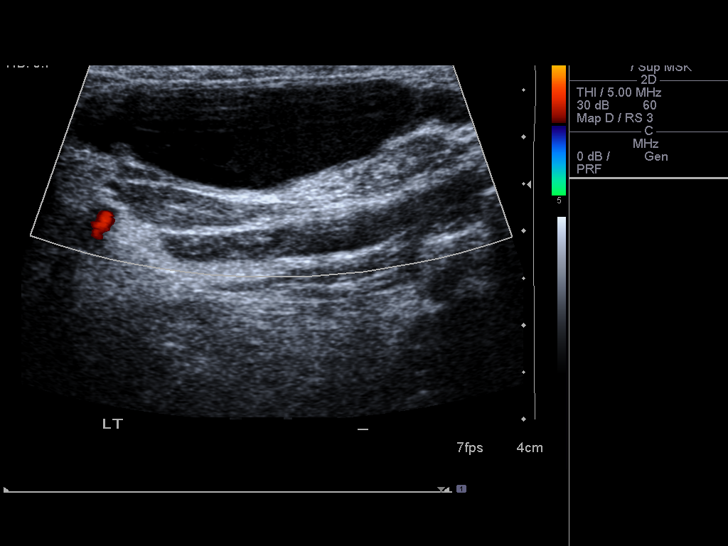
[im 6/21]
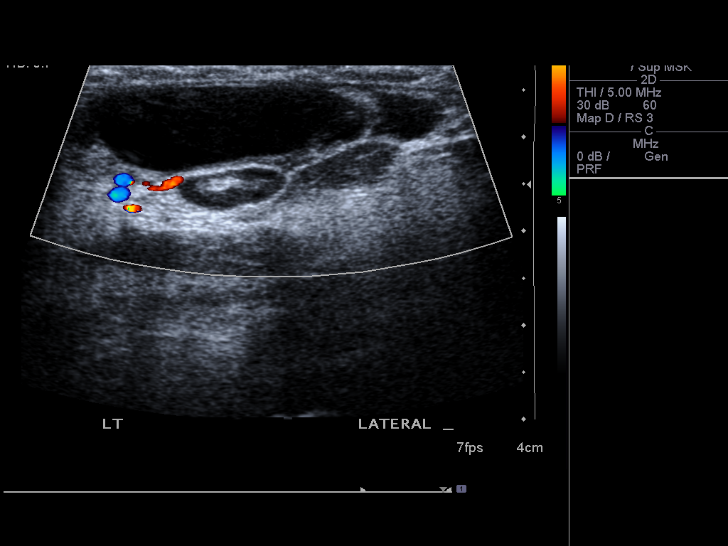
[im 7/21]
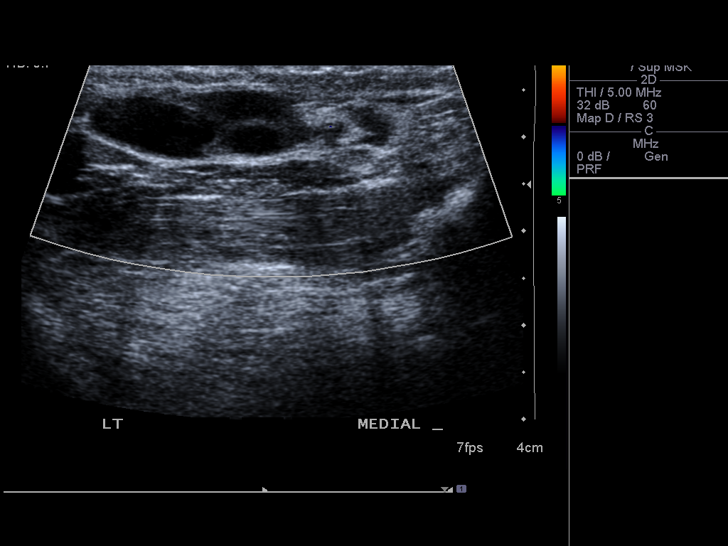
[im 9/21]
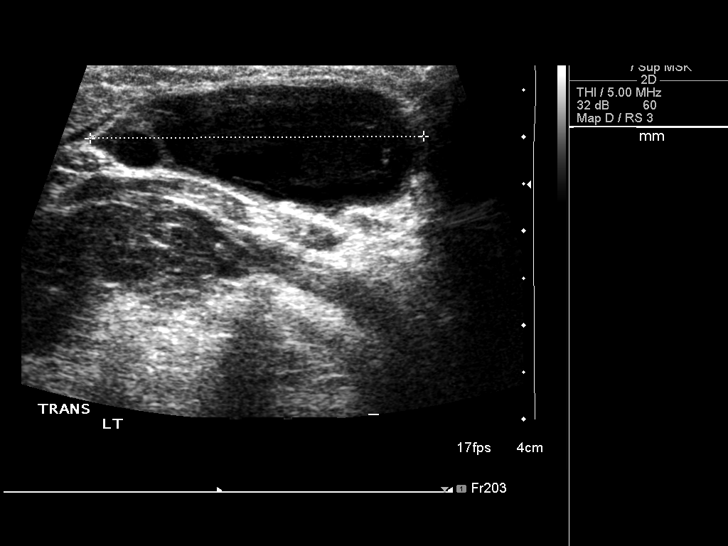
[im 10/21]
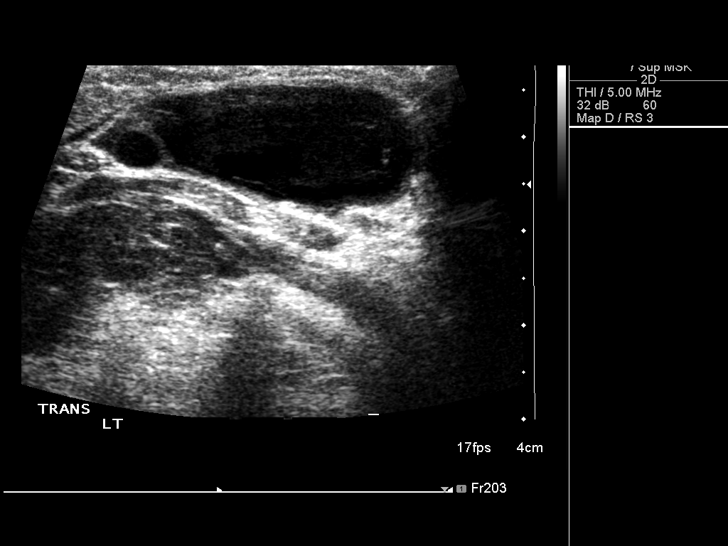
[im 12/21]
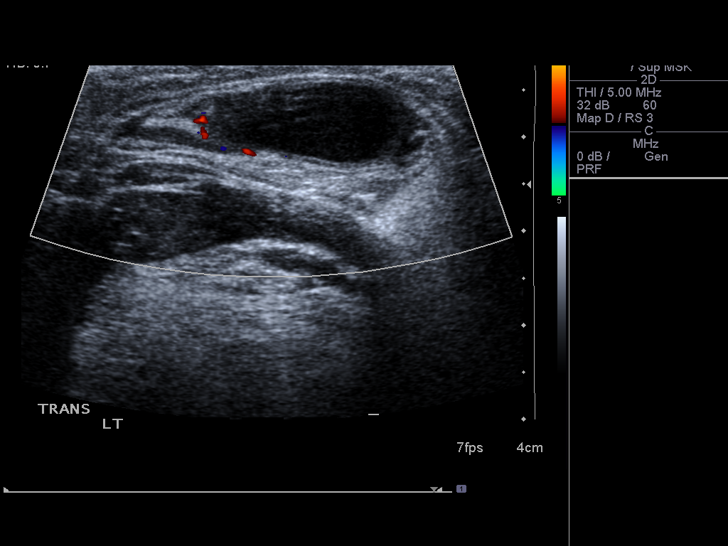
[im 13/21]
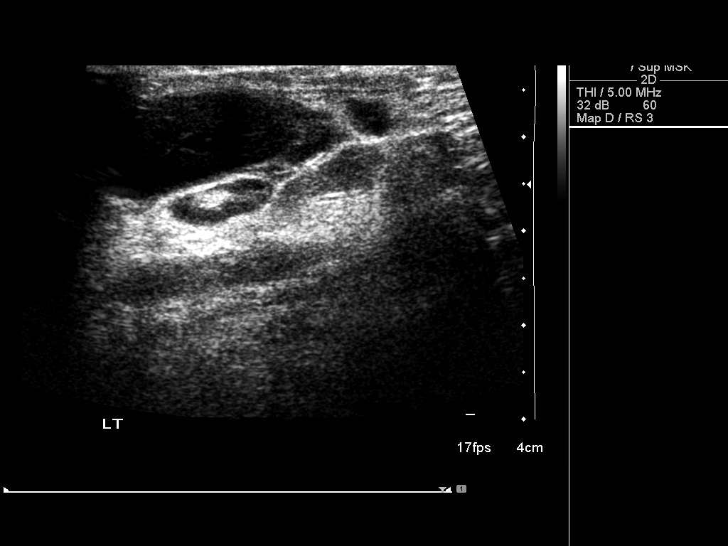
[im 15/21]
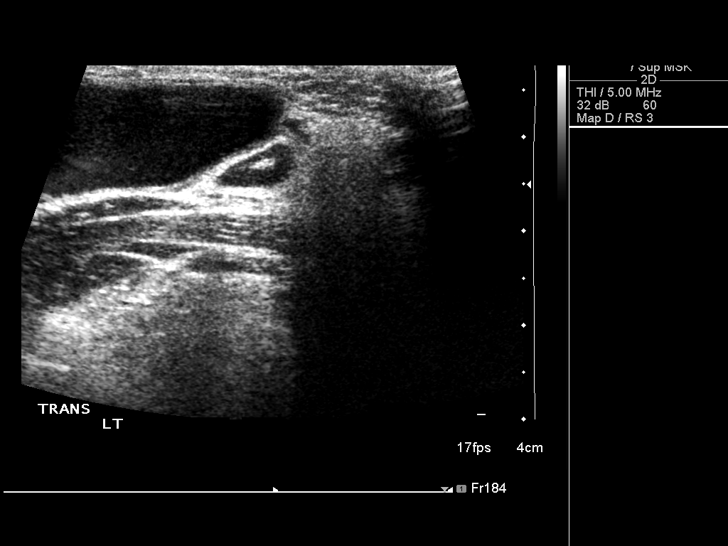
[im 16/21]
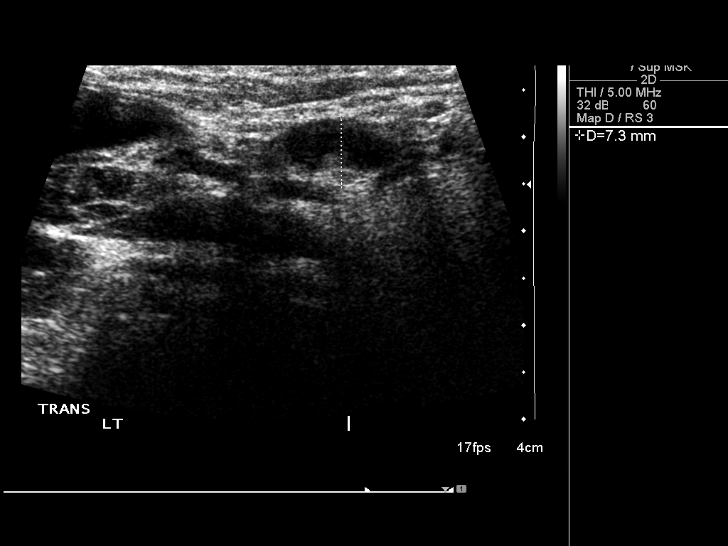
[im 18/21]
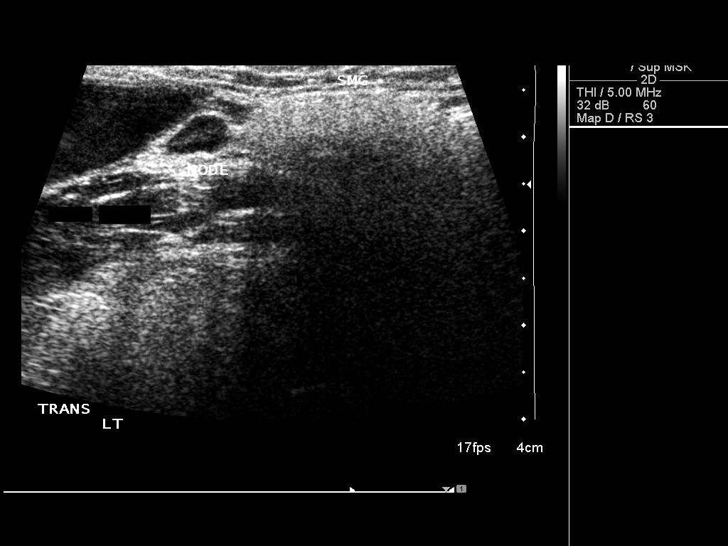
[im 19/21]
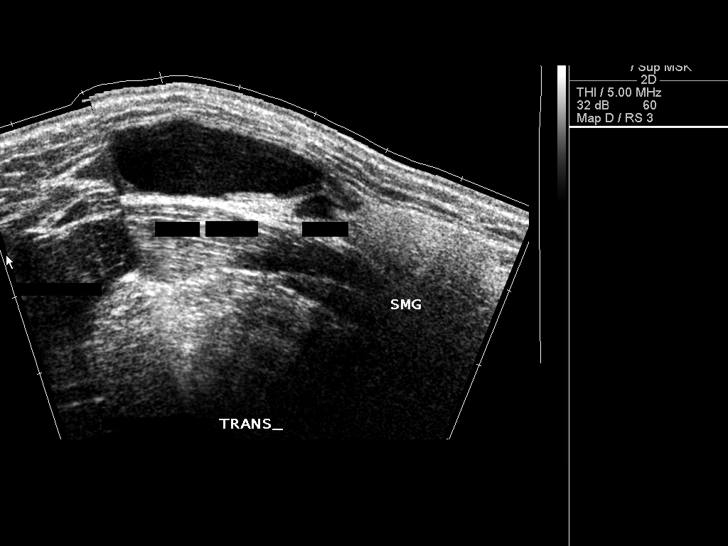
[im 21/21]
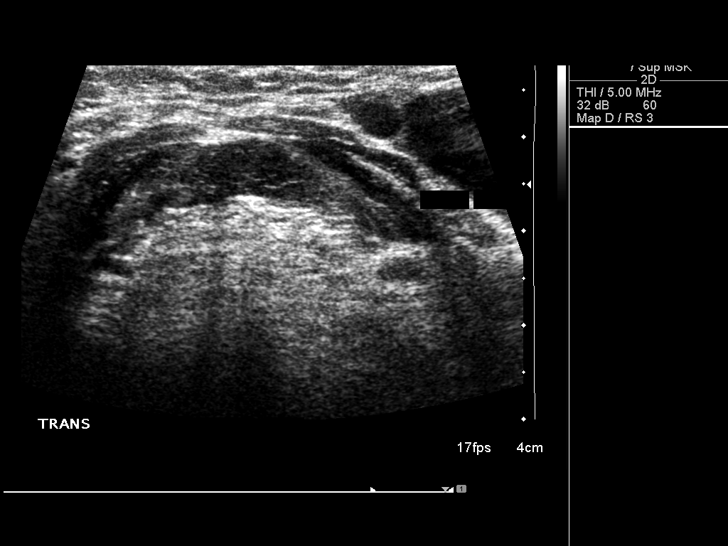

[14 of 21 positions shown; findings below may reference images not displayed]

FINDINGS: Corresponding with the the patient's palpable concern in
the left submandibular region is a cystic lesion measuring 4.3 x
1.2 x 3.5 cm.  This appears mildly complex with low-level internal
echoes and small septations.  No solid components or central
hypervascularity identified.  Adjacent lymph nodes are normal in
size.
IMPRESSION: Palpable concern in the left submandibular region corresponds with
a mildly complex cystic lesion.  If present for 25 years, this is
most likely a branchial cleft cyst (clinical fluctuation and
appearance related to infection or hemorrhage).  Less likely
considerations include a lymphangioma, ranula and sialocele.  Neck
CT or MRI may be helpful for further assessment if clinically
warranted.

## 2013-12-27 ENCOUNTER — Telehealth: Payer: Self-pay | Admitting: Family Medicine

## 2013-12-27 MED ORDER — ZOSTER VACCINE LIVE 19400 UNT/0.65ML ~~LOC~~ SOLR: 0.6500 mL | Freq: Once | SUBCUTANEOUS | Status: DC

## 2013-12-27 NOTE — Telephone Encounter (Signed)
[phone]  Pt is wanting a shingle injection [facility] [address]

## 2013-12-27 NOTE — Telephone Encounter (Signed)
Sent to pharm ....

## 2014-01-06 ENCOUNTER — Ambulatory Visit (INDEPENDENT_AMBULATORY_CARE_PROVIDER_SITE_OTHER): Payer: 59 | Admitting: Family Medicine

## 2014-01-06 ENCOUNTER — Encounter: Payer: Self-pay | Admitting: Family Medicine

## 2014-01-06 VITALS — BP 128/74 | HR 60 | Temp 98.1°F | Resp 16 | Ht 71.5 in | Wt 184.0 lb

## 2014-01-06 DIAGNOSIS — R361 Hematospermia: Secondary | ICD-10-CM

## 2014-01-06 LAB — COMPLETE METABOLIC PANEL WITH GFR
ALBUMIN: 4 g/dL (ref 3.5–5.2)
ALT: 31 U/L (ref 0–53)
AST: 23 U/L (ref 0–37)
Alkaline Phosphatase: 63 U/L (ref 39–117)
BUN: 23 mg/dL (ref 6–23)
CHLORIDE: 100 meq/L (ref 96–112)
CO2: 29 mEq/L (ref 19–32)
Calcium: 9.6 mg/dL (ref 8.4–10.5)
Creat: 0.76 mg/dL (ref 0.50–1.35)
GFR, Est African American: >89 mL/min
GFR, Est Non African American: >89 mL/min
Glucose, Bld: 92 mg/dL (ref 70–99)
POTASSIUM: 3.8 meq/L (ref 3.5–5.3)
Sodium: 139 mEq/L (ref 135–145)
TOTAL PROTEIN: 6.3 g/dL (ref 6.0–8.3)
Total Bilirubin: 0.6 mg/dL (ref 0.2–1.2)

## 2014-01-06 NOTE — Progress Notes (Signed)
Subjective:    Patient ID: [name], male    DOB: 09-27-1950, 63 y.o.   MRN: [id number]  HPI Recently the patient has had 2 episodes of symptomatic aspermia. The first was bright red. The second was darker brown. It is also less intense. He has not witnessed any sense. He denies any dysuria. He denies any hematuria. He has a past medical history of a vasectomy. He does complain of urinary frequency and weak urinary stream. On examination today he has a +1 prostate without nodularity. He does have symptoms of mild BPH. He also complains of erectile dysfunction. Past Medical History  Diagnosis Date  . Hypertension   . GERD (gastroesophageal reflux disease)   . Allergy history, seafood     oysters, mussels  . Allergy     wood sap  . Hypercholesteremia   . Cancer     "skin cancer frozen off"  . Arthritis     neck  . Colon polyps   . Bee sting allergy 1980    hives after 20 yellowjacket stings   Past Surgical History  Procedure Laterality Date  . Carpal tunnel release Right 1985    elbow  . Shoulder arthroscopy with subacromial decompression and open rotator c Right 10/22/2012    Procedure: RIGHT SHOULDER ARTHROSCOPY WITH SUBACROMIAL DECOMPRESSION AND  ROTATOR CUFF REPAIR,  CORACOACROMIAL LIGAMENT RELEASE AND  MANIPULATION UNDER ANESTHESIA;  Surgeon: Johnn Hai, MD;  Location: [facility];  Service: Orthopedics;  Laterality: Right;   Current Outpatient Prescriptions on File Prior to Visit  Medication Sig Dispense Refill  . hydrochlorothiazide (HYDRODIURIL) 25 MG tablet TAKE 1 TABLET DAILY 90 tablet 3  . levocetirizine (XYZAL) 5 MG tablet TAKE 1 TABLET EVERY EVENING 30 tablet 11  . losartan (COZAAR) 100 MG tablet TAKE 1 TABLET EVERY MORNING 90 tablet 3  . pantoprazole (PROTONIX) 40 MG tablet Take 1 tablet (40 mg total) by mouth daily. 90 tablet 4  . pravastatin (PRAVACHOL) 40 MG tablet TAKE 1 TABLET AT BEDTIME 90 tablet 1  . triamcinolone cream (KENALOG) 0.1 % Apply 1  application topically 2 (two) times daily. 30 g 0  . zafirlukast (ACCOLATE) 20 MG tablet Take 20 mg by mouth 2 (two) times daily before a meal.    . zoster vaccine live, PF, (ZOSTAVAX) 40981 UNT/0.65ML injection Inject 19,400 Units into the skin once. 1 each 0   No current facility-administered medications on file prior to visit.   History   Social History  . Marital Status: Married    Spouse Name: N/A    Number of Children: N/A  . Years of Education: N/A   Occupational History  . Not on file.   Social History Main Topics  . Smoking status: Former Smoker -- 1.00 packs/day for 15 years    Types: Cigarettes    Quit date: 03/04/1984  . Smokeless tobacco: Former Systems developer    Types: Chew    Quit date: 03/04/2008  . Alcohol Use: No  . Drug Use: No  . Sexual Activity: Not on file   Other Topics Concern  . Not on file   Social History Narrative   Family History  Problem Relation Age of Onset  . Heart failure Mother   . Heart failure Father       Review of Systems  All other systems reviewed and are negative.      Objective:   Physical Exam  Cardiovascular: Normal rate, regular rhythm and normal heart sounds.   No  murmur heard. Pulmonary/Chest: Effort normal and breath sounds normal. No respiratory distress. He has no wheezes. He has no rales.  Abdominal: Soft. Bowel sounds are normal. He exhibits no distension. There is no tenderness. There is no rebound and no guarding.  Genitourinary: Rectum normal and prostate normal.  Vitals reviewed.         Assessment & Plan:  Hematospermia - Plan: COMPLETE METABOLIC PANEL WITH GFR, PSA  Patient's rectal exam is normal. I will check a PSA. If symptoms never returned, I do not feel that any further workup is necessary. If he has persistent amount aspermia I would consult urology. Also gave patient samples of Cialis 5 mg poqday for BPH and erectile dysfunction.

## 2014-01-07 ENCOUNTER — Encounter: Payer: Self-pay | Admitting: Family Medicine

## 2014-01-07 LAB — PSA: PSA: 0.47 ng/mL (ref <=4.00)

## 2014-01-20 ENCOUNTER — Other Ambulatory Visit: Payer: Self-pay | Admitting: Family Medicine

## 2014-02-07 ENCOUNTER — Telehealth: Payer: Self-pay | Admitting: Family Medicine

## 2014-02-07 NOTE — Telephone Encounter (Signed)
Does he need a prescription for this or is it FYI?

## 2014-02-07 NOTE — Telephone Encounter (Signed)
Pt states that the Cilias 20 mg works  [phone]

## 2014-02-14 NOTE — Telephone Encounter (Signed)
Patient does need this filled  [facility]

## 2014-02-16 NOTE — Telephone Encounter (Signed)
noted 

## 2014-02-21 MED ORDER — TADALAFIL 20 MG PO TABS: 20.0000 mg | ORAL_TABLET | ORAL | Status: DC | PRN

## 2014-02-21 NOTE — Telephone Encounter (Signed)
Patient is calling for the 3rd time about his cialis, last note said it was noted? I assured him this would get done and would be addressed please call him at [phone] ([phone]  if any questions  [facility]

## 2014-02-21 NOTE — Telephone Encounter (Signed)
Pt states that the cialis 20mg  prn worked better for him and wanted an rx sent to pharm. Med was sent to pharm and pt aware.

## 2014-04-07 ENCOUNTER — Other Ambulatory Visit: Payer: 59

## 2014-04-07 DIAGNOSIS — Z0000 Encounter for general adult medical examination without abnormal findings: Secondary | ICD-10-CM

## 2014-04-07 DIAGNOSIS — I10 Essential (primary) hypertension: Secondary | ICD-10-CM

## 2014-04-07 DIAGNOSIS — E785 Hyperlipidemia, unspecified: Secondary | ICD-10-CM

## 2014-04-07 DIAGNOSIS — Z79899 Other long term (current) drug therapy: Secondary | ICD-10-CM

## 2014-04-07 LAB — LIPID PANEL
Cholesterol: 147 mg/dL (ref 0–200)
HDL: 51 mg/dL (ref >39)
LDL CALC: 86 mg/dL (ref 0–99)
TRIGLYCERIDES: 50 mg/dL (ref <150)
Total CHOL/HDL Ratio: 2.9 Ratio
VLDL: 10 mg/dL (ref 0–40)

## 2014-04-07 LAB — CBC WITH DIFFERENTIAL/PLATELET
BASOS ABS: 0.1 10*3/uL (ref 0.0–0.1)
Basophils Relative: 1 % (ref 0–1)
EOS PCT: 2 % (ref 0–5)
Eosinophils Absolute: 0.2 10*3/uL (ref 0.0–0.7)
HCT: 48.9 % (ref 39.0–52.0)
Hemoglobin: 16.6 g/dL (ref 13.0–17.0)
LYMPHS PCT: 23 % (ref 12–46)
Lymphs Abs: 1.8 10*3/uL (ref 0.7–4.0)
MCH: 31.4 pg (ref 26.0–34.0)
MCHC: 33.9 g/dL (ref 30.0–36.0)
MCV: 92.4 fL (ref 78.0–100.0)
MONO ABS: 0.5 10*3/uL (ref 0.1–1.0)
MPV: 10 fL (ref 8.6–12.4)
Monocytes Relative: 7 % (ref 3–12)
NEUTROS ABS: 5.2 10*3/uL (ref 1.7–7.7)
Neutrophils Relative %: 67 % (ref 43–77)
Platelets: 255 10*3/uL (ref 150–400)
RBC: 5.29 MIL/uL (ref 4.22–5.81)
RDW: 13.3 % (ref 11.5–15.5)
WBC: 7.8 10*3/uL (ref 4.0–10.5)

## 2014-04-12 ENCOUNTER — Encounter: Payer: Self-pay | Admitting: Family Medicine

## 2014-04-12 ENCOUNTER — Ambulatory Visit (INDEPENDENT_AMBULATORY_CARE_PROVIDER_SITE_OTHER): Payer: 59 | Admitting: Family Medicine

## 2014-04-12 VITALS — BP 124/70 | HR 82 | Temp 98.3°F | Resp 16 | Ht 71.5 in | Wt 191.0 lb

## 2014-04-12 DIAGNOSIS — Z0000 Encounter for general adult medical examination without abnormal findings: Secondary | ICD-10-CM

## 2014-04-12 NOTE — Progress Notes (Signed)
Subjective:    Patient ID: [name], male    DOB: 05-Apr-1950, 64 y.o.   MRN: [id number]  HPI  Patient is here today for complete physical exam. He has no specific concerns. Patient is retiring in one month. He has been very compliant following a Atkins diet/ketogenic diet. His most recent lab work reveals his best cholesterol ever. Lab on 04/07/2014  Component Date Value Ref Range Status  . WBC 04/07/2014 7.8  4.0 - 10.5 K/uL Final  . RBC 04/07/2014 5.29  4.22 - 5.81 MIL/uL Final  . Hemoglobin 04/07/2014 16.6  13.0 - 17.0 g/dL Final  . HCT 04/07/2014 48.9  39.0 - 52.0 % Final  . MCV 04/07/2014 92.4  78.0 - 100.0 fL Final  . MCH 04/07/2014 31.4  26.0 - 34.0 pg Final  . MCHC 04/07/2014 33.9  30.0 - 36.0 g/dL Final  . RDW 04/07/2014 13.3  11.5 - 15.5 % Final  . Platelets 04/07/2014 255  150 - 400 K/uL Final  . MPV 04/07/2014 10.0  8.6 - 12.4 fL Final  . Neutrophils Relative % 04/07/2014 67  43 - 77 % Final  . Neutro Abs 04/07/2014 5.2  1.7 - 7.7 K/uL Final  . Lymphocytes Relative 04/07/2014 23  12 - 46 % Final  . Lymphs Abs 04/07/2014 1.8  0.7 - 4.0 K/uL Final  . Monocytes Relative 04/07/2014 7  3 - 12 % Final  . Monocytes Absolute 04/07/2014 0.5  0.1 - 1.0 K/uL Final  . Eosinophils Relative 04/07/2014 2  0 - 5 % Final  . Eosinophils Absolute 04/07/2014 0.2  0.0 - 0.7 K/uL Final  . Basophils Relative 04/07/2014 1  0 - 1 % Final  . Basophils Absolute 04/07/2014 0.1  0.0 - 0.1 K/uL Final  . Smear Review 04/07/2014 Criteria for review not met   Final  . Cholesterol 04/07/2014 147  0 - 200 mg/dL Final   Comment: ATP III Classification:       < 200        mg/dL        Desirable      200 - 239     mg/dL        Borderline High      >= 240        mg/dL        High     . Triglycerides 04/07/2014 50  <150 mg/dL Final  . HDL 04/07/2014 51  >39 mg/dL Final  . Total CHOL/HDL Ratio 04/07/2014 2.9   Final  . VLDL 04/07/2014 10  0 - 40 mg/dL Final  . LDL Cholesterol 04/07/2014 86  0  - 99 mg/dL Final   Comment:   Total Cholesterol/HDL Ratio:CHD Risk                        Coronary Heart Disease Risk Table                                        Men       Women          1/2 Average Risk              3.4        3.3              Average Risk  5.0        4.4           2X Average Risk              9.6        7.1           3X Average Risk             23.4       11.0 Use the calculated Patient Ratio above and the CHD Risk table  to determine the patient's CHD Risk. ATP III Classification (LDL):       < 100        mg/dL         Optimal      100 - 129     mg/dL         Near or Above Optimal      130 - 159     mg/dL         Borderline High      160 - 189     mg/dL         High       > 190        mg/dL         Very High      CMP and PSA were checked in November and were excellent Past Medical History  Diagnosis Date  . Hypertension   . GERD (gastroesophageal reflux disease)   . Allergy history, seafood     oysters, mussels  . Allergy     wood sap  . Hypercholesteremia   . Cancer     "skin cancer frozen off"  . Arthritis     neck  . Colon polyps   . Bee sting allergy 1980    hives after 20 yellowjacket stings   Past Surgical History  Procedure Laterality Date  . Carpal tunnel release Right 1985    elbow  . Shoulder arthroscopy with subacromial decompression and open rotator c Right 10/22/2012    Procedure: RIGHT SHOULDER ARTHROSCOPY WITH SUBACROMIAL DECOMPRESSION AND  ROTATOR CUFF REPAIR,  CORACOACROMIAL LIGAMENT RELEASE AND  MANIPULATION UNDER ANESTHESIA;  Surgeon: Johnn Hai, MD;  Location: [facility];  Service: Orthopedics;  Laterality: Right;   Current Outpatient Prescriptions on File Prior to Visit  Medication Sig Dispense Refill  . hydrochlorothiazide (HYDRODIURIL) 25 MG tablet TAKE 1 TABLET DAILY 90 tablet 3  . levocetirizine (XYZAL) 5 MG tablet TAKE 1 TABLET EVERY EVENING 30 tablet 11  . losartan (COZAAR) 100 MG tablet TAKE 1 TABLET EVERY  MORNING 90 tablet 3  . pantoprazole (PROTONIX) 40 MG tablet Take 1 tablet (40 mg total) by mouth daily. 90 tablet 4  . pravastatin (PRAVACHOL) 40 MG tablet TAKE 1 TABLET AT BEDTIME 90 tablet 1  . tadalafil (CIALIS) 20 MG tablet Take 1 tablet (20 mg total) by mouth as needed. 5 tablet 11  . zafirlukast (ACCOLATE) 20 MG tablet TAKE 1 TABLET TWICE A DAY (Patient not taking: Reported on 04/12/2014) 60 tablet 5   No current facility-administered medications on file prior to visit.   Allergies  Allergen Reactions  . Iodine     Topical antiseptic-as a young adult. Don't know reaction   History   Social History  . Marital Status: Married    Spouse Name: N/A    Number of Children: N/A  . Years of Education: N/A   Occupational History  . Not on file.   Social History Main  Topics  . Smoking status: Former Smoker -- 1.00 packs/day for 15 years    Types: Cigarettes    Quit date: 03/04/1984  . Smokeless tobacco: Former Systems developer    Types: Chew    Quit date: 03/04/2008  . Alcohol Use: No  . Drug Use: No  . Sexual Activity: Not on file   Other Topics Concern  . Not on file   Social History Narrative   Family History  Problem Relation Age of Onset  . Heart failure Mother   . Heart failure Father      Review of Systems  All other systems reviewed and are negative.      Objective:   Physical Exam  Constitutional: He is oriented to person, place, and time. He appears well-developed and well-nourished. No distress.  HENT:  Head: Normocephalic and atraumatic.  Right Ear: External ear normal.  Left Ear: External ear normal.  Nose: Nose normal.  Mouth/Throat: Oropharynx is clear and moist. No oropharyngeal exudate.  Eyes: Conjunctivae and EOM are normal. Pupils are equal, round, and reactive to light. Right eye exhibits no discharge. Left eye exhibits no discharge. No scleral icterus.  Neck: Normal range of motion. Neck supple. No JVD present. No tracheal deviation present. No  thyromegaly present.  Cardiovascular: Normal rate, regular rhythm, normal heart sounds and intact distal pulses.  Exam reveals no gallop and no friction rub.   No murmur heard. Pulmonary/Chest: Effort normal and breath sounds normal. No stridor. No respiratory distress. He has no wheezes. He has no rales. He exhibits no tenderness.  Abdominal: Soft. Bowel sounds are normal. He exhibits no distension and no mass. There is no tenderness. There is no rebound and no guarding.  Musculoskeletal: Normal range of motion. He exhibits no edema or tenderness.  Lymphadenopathy:    He has no cervical adenopathy.  Neurological: He is alert and oriented to person, place, and time. He has normal reflexes. He displays normal reflexes. No cranial nerve deficit. He exhibits normal muscle tone. Coordination normal.  Skin: Skin is warm. No rash noted. He is not diaphoretic. No erythema. No pallor.  Psychiatric: He has a normal mood and affect. His behavior is normal. Judgment and thought content normal.  Vitals reviewed.         Assessment & Plan:  Routine general medical examination at a health care facility  Physical exam is completely normal. Blood pressure is excellent. Lab work is excellent. Patient's tetanus shot, flu shot, and shingles shot are all up-to-date. Colonoscopy was performed in 2014 and was significant for normal polyps. Recommended follow-up was in 5 years. PSA and prostate exam were performed in November and were normal. Regular anticipatory guidance was provided. No changes in medication at this time.

## 2014-04-20 ENCOUNTER — Other Ambulatory Visit: Payer: Self-pay | Admitting: Family Medicine

## 2014-04-20 MED ORDER — LEVOCETIRIZINE DIHYDROCHLORIDE 5 MG PO TABS: 5.0000 mg | ORAL_TABLET | Freq: Every evening | ORAL | Status: DC

## 2014-04-20 MED ORDER — HYDROCHLOROTHIAZIDE 25 MG PO TABS: 25.0000 mg | ORAL_TABLET | Freq: Every day | ORAL | Status: DC

## 2014-04-20 MED ORDER — PRAVASTATIN SODIUM 40 MG PO TABS: 40.0000 mg | ORAL_TABLET | Freq: Every day | ORAL | Status: DC

## 2014-04-20 MED ORDER — PANTOPRAZOLE SODIUM 40 MG PO TBEC: 40.0000 mg | DELAYED_RELEASE_TABLET | Freq: Every day | ORAL | Status: DC

## 2014-04-20 MED ORDER — LOSARTAN POTASSIUM 100 MG PO TABS: 100.0000 mg | ORAL_TABLET | Freq: Every morning | ORAL | Status: DC

## 2014-04-20 NOTE — Telephone Encounter (Signed)
All meds sent to [facility] for refills

## 2014-05-18 ENCOUNTER — Other Ambulatory Visit: Payer: Self-pay | Admitting: Family Medicine

## 2014-05-18 MED ORDER — PRAVASTATIN SODIUM 40 MG PO TABS: 40.0000 mg | ORAL_TABLET | Freq: Every day | ORAL | Status: DC

## 2014-05-18 MED ORDER — LOSARTAN POTASSIUM 100 MG PO TABS: 100.0000 mg | ORAL_TABLET | Freq: Every morning | ORAL | Status: DC

## 2014-05-18 MED ORDER — PANTOPRAZOLE SODIUM 40 MG PO TBEC: 40.0000 mg | DELAYED_RELEASE_TABLET | Freq: Every day | ORAL | Status: DC

## 2014-05-18 MED ORDER — LEVOCETIRIZINE DIHYDROCHLORIDE 5 MG PO TABS: 5.0000 mg | ORAL_TABLET | Freq: Every evening | ORAL | Status: DC

## 2014-05-18 MED ORDER — HYDROCHLOROTHIAZIDE 25 MG PO TABS: 25.0000 mg | ORAL_TABLET | Freq: Every day | ORAL | Status: DC

## 2014-05-18 NOTE — Telephone Encounter (Signed)
All meds sent to [facility] per pt's request

## 2014-08-18 ENCOUNTER — Encounter: Payer: Self-pay | Admitting: Family Medicine

## 2014-08-18 ENCOUNTER — Ambulatory Visit (INDEPENDENT_AMBULATORY_CARE_PROVIDER_SITE_OTHER): Payer: 59 | Admitting: Family Medicine

## 2014-08-18 VITALS — BP 168/90 | HR 80 | Temp 98.5°F | Resp 16 | Ht 71.5 in | Wt 216.0 lb

## 2014-08-18 DIAGNOSIS — F458 Other somatoform disorders: Secondary | ICD-10-CM | POA: Diagnosis not present

## 2014-08-18 DIAGNOSIS — R0989 Other specified symptoms and signs involving the circulatory and respiratory systems: Secondary | ICD-10-CM

## 2014-08-18 NOTE — Progress Notes (Signed)
Subjective:    Patient ID: [name], male    DOB: 03-Dec-1950, 64 y.o.   MRN: [id number]  HPI Over the last 3 or 4 months, the patient has been smoking 3-4 cigars a day. Thankfully he stopped this. But now he believes that over the last month his upper throat feels swollen. He states that it feels like his trachea is narrowed 20 takes a deep breath in. He denies any dysphagia. He denies food sticking. He denies any difficulty swallowing. There are no palpable masses in his neck. There is no cervical lymphadenopathy. He denies any hemoptysis. He denies any acid reflux. He does have some mild allergy symptoms. He also admits that anxiety could be a contributing factor Past Medical History  Diagnosis Date  . Hypertension   . GERD (gastroesophageal reflux disease)   . Allergy history, seafood     oysters, mussels  . Allergy     wood sap  . Hypercholesteremia   . Cancer     "skin cancer frozen off"  . Arthritis     neck  . Colon polyps   . Bee sting allergy 1980    hives after 20 yellowjacket stings   Past Surgical History  Procedure Laterality Date  . Carpal tunnel release Right 1985    elbow  . Shoulder arthroscopy with subacromial decompression and open rotator c Right 10/22/2012    Procedure: RIGHT SHOULDER ARTHROSCOPY WITH SUBACROMIAL DECOMPRESSION AND  ROTATOR CUFF REPAIR,  CORACOACROMIAL LIGAMENT RELEASE AND  MANIPULATION UNDER ANESTHESIA;  Surgeon: Johnn Hai, MD;  Location: [facility];  Service: Orthopedics;  Laterality: Right;   Current Outpatient Prescriptions on File Prior to Visit  Medication Sig Dispense Refill  . hydrochlorothiazide (HYDRODIURIL) 25 MG tablet Take 1 tablet (25 mg total) by mouth daily. 90 tablet 4  . levocetirizine (XYZAL) 5 MG tablet Take 1 tablet (5 mg total) by mouth every evening. 90 tablet 4  . losartan (COZAAR) 100 MG tablet Take 1 tablet (100 mg total) by mouth every morning. 90 tablet 4  . pantoprazole (PROTONIX) 40 MG tablet Take 1  tablet (40 mg total) by mouth daily. 90 tablet 4  . pravastatin (PRAVACHOL) 40 MG tablet Take 1 tablet (40 mg total) by mouth at bedtime. 90 tablet 4  . tadalafil (CIALIS) 20 MG tablet Take 1 tablet (20 mg total) by mouth as needed. 5 tablet 11  . zafirlukast (ACCOLATE) 20 MG tablet TAKE 1 TABLET TWICE A DAY 60 tablet 5   No current facility-administered medications on file prior to visit.   Allergies  Allergen Reactions  . Iodine     Topical antiseptic-as a young adult. Don't know reaction   History   Social History  . Marital Status: Married    Spouse Name: N/A  . Number of Children: N/A  . Years of Education: N/A   Occupational History  . Not on file.   Social History Main Topics  . Smoking status: Former Smoker -- 1.00 packs/day for 15 years    Types: Cigarettes    Quit date: 03/04/1984  . Smokeless tobacco: Former Systems developer    Types: Chew    Quit date: 03/04/2008  . Alcohol Use: No  . Drug Use: No  . Sexual Activity: Not on file   Other Topics Concern  . Not on file   Social History Narrative     Review of Systems  All other systems reviewed and are negative.      Objective:  Physical Exam  Constitutional: He appears well-developed and well-nourished.  HENT:  Mouth/Throat: Oropharynx is clear and moist. No oropharyngeal exudate.  Neck: Neck supple. No JVD present. No thyromegaly present.  Cardiovascular: Normal rate, regular rhythm and normal heart sounds.   No murmur heard. Pulmonary/Chest: Effort normal and breath sounds normal. No stridor. No respiratory distress. He has no wheezes. He has no rales.  Abdominal: Soft. Bowel sounds are normal. He exhibits no distension. There is no tenderness. There is no rebound.  Lymphadenopathy:    He has no cervical adenopathy.  Vitals reviewed.         Assessment & Plan:  Globus sensation  I explained to the patient I feel this is likely multifactorial. Anxiety is certainly playing a component. There may be  some irritation from the cigars.  Postnasal drip from allergies and acid reflux are also likely contributing factors. I would like him to increase proton asked to 40 mg by mouth twice a day and resume his allergy medication. Patient has already stopped smoking. His physical exam is Reassuring. I recommended that we monitor his symptoms for the next 2-3 weeks. If symptoms persist or worsen, I would recommend an ENT evaluation for laryngoscopy. However if symptoms improve no further workup is necessary.  I believe the patient's blood pressure today is due to anxiety. He will check his blood pressure frequently at home and notify me of the values in a few days. Previously it has been well controlled

## 2014-09-10 ENCOUNTER — Emergency Department (HOSPITAL_COMMUNITY): Payer: 59

## 2014-09-10 ENCOUNTER — Encounter (HOSPITAL_COMMUNITY): Payer: Self-pay | Admitting: Emergency Medicine

## 2014-09-10 ENCOUNTER — Emergency Department (HOSPITAL_COMMUNITY)
Admission: EM | Admit: 2014-09-10 | Discharge: 2014-09-10 | Disposition: A | Discharge status: Home / Self Care | Payer: 59 | Attending: Emergency Medicine | Admitting: Emergency Medicine

## 2014-09-10 DIAGNOSIS — I10 Essential (primary) hypertension: Secondary | ICD-10-CM | POA: Insufficient documentation

## 2014-09-10 DIAGNOSIS — E782 Mixed hyperlipidemia: Secondary | ICD-10-CM | POA: Diagnosis not present

## 2014-09-10 DIAGNOSIS — Z79899 Other long term (current) drug therapy: Secondary | ICD-10-CM | POA: Insufficient documentation

## 2014-09-10 DIAGNOSIS — R079 Chest pain, unspecified: Secondary | ICD-10-CM | POA: Insufficient documentation

## 2014-09-10 DIAGNOSIS — Z8739 Personal history of other diseases of the musculoskeletal system and connective tissue: Secondary | ICD-10-CM | POA: Diagnosis not present

## 2014-09-10 DIAGNOSIS — Z87891 Personal history of nicotine dependence: Secondary | ICD-10-CM | POA: Insufficient documentation

## 2014-09-10 DIAGNOSIS — Z86010 Personal history of colonic polyps: Secondary | ICD-10-CM | POA: Insufficient documentation

## 2014-09-10 DIAGNOSIS — Z85828 Personal history of other malignant neoplasm of skin: Secondary | ICD-10-CM | POA: Diagnosis not present

## 2014-09-10 DIAGNOSIS — K219 Gastro-esophageal reflux disease without esophagitis: Secondary | ICD-10-CM | POA: Insufficient documentation

## 2014-09-10 LAB — CBC
HCT: 46.3 % (ref 39.0–52.0)
Hemoglobin: 15.9 g/dL (ref 13.0–17.0)
MCH: 31.5 pg (ref 26.0–34.0)
MCHC: 34.3 g/dL (ref 30.0–36.0)
MCV: 91.9 fL (ref 78.0–100.0)
Platelets: 220 10*3/uL (ref 150–400)
RBC: 5.04 MIL/uL (ref 4.22–5.81)
RDW: 12.9 % (ref 11.5–15.5)
WBC: 7.1 10*3/uL (ref 4.0–10.5)

## 2014-09-10 LAB — HEPATIC FUNCTION PANEL
ALBUMIN: 3.6 g/dL (ref 3.5–5.0)
ALT: 27 U/L (ref 17–63)
AST: 26 U/L (ref 15–41)
Alkaline Phosphatase: 70 U/L (ref 38–126)
BILIRUBIN INDIRECT: 0.5 mg/dL (ref 0.3–0.9)
Bilirubin, Direct: 0.2 mg/dL (ref 0.1–0.5)
Total Bilirubin: 0.7 mg/dL (ref 0.3–1.2)
Total Protein: 6.3 g/dL — ABNORMAL LOW (ref 6.5–8.1)

## 2014-09-10 LAB — I-STAT TROPONIN, ED: Troponin i, poc: 0 ng/mL (ref 0.00–0.08)

## 2014-09-10 LAB — BASIC METABOLIC PANEL
ANION GAP: 8 (ref 5–15)
BUN: 9 mg/dL (ref 6–20)
CHLORIDE: 101 mmol/L (ref 101–111)
CO2: 31 mmol/L (ref 22–32)
CREATININE: 0.86 mg/dL (ref 0.61–1.24)
Calcium: 9 mg/dL (ref 8.9–10.3)
GFR calc Af Amer: >60 mL/min (ref >60)
GFR calc non Af Amer: >60 mL/min (ref >60)
Glucose, Bld: 118 mg/dL — ABNORMAL HIGH (ref 65–99)
Potassium: 3.7 mmol/L (ref 3.5–5.1)
Sodium: 140 mmol/L (ref 135–145)

## 2014-09-10 LAB — TROPONIN I: Troponin I: <0.03 ng/mL (ref <0.031)

## 2014-09-10 LAB — LIPASE, BLOOD: Lipase: 23 U/L (ref 22–51)

## 2014-09-10 MED ORDER — GI COCKTAIL ~~LOC~~
30.0000 mL | Freq: Once | ORAL | Status: AC
  Administered 2014-09-10: 30 mL via ORAL
  Filled 2014-09-10: qty 30

## 2014-09-10 NOTE — ED Provider Notes (Signed)
CSN: [id number]     Arrival date & time 09/10/14  0919 History   First MD Initiated Contact with Patient 09/10/14 [phone]     Chief Complaint  Patient presents with  . Chest Pain     (Consider location/radiation/quality/duration/timing/severity/associated sxs/prior Treatment) HPI Comments: Patient complains of central "indigestion pain" that started this morning about 1 hour ago while he was reading email. The pain is in the center of his chest and feels like a tight burning. It does not radiate. Nothing makes it better. He did take some bicarbonate which helped somewhat. He states laughing makes it worse. He denies any shortness of breath, nausea, vomiting, dizziness, syncope, diaphoresis. States he's had acid reflux in the past last episode 6 months ago but never this bad. Usually resolves with bicarbonate. Denies any cardiac history. Reports stress test about 10 years ago. History of hypertension, hypercholesterolemia, former smoker. Denies any back pain, abdominal pain, head or neck pain. No dizziness or lightheadedness. No fevers or vomiting.  The history is provided by the patient.    Past Medical History  Diagnosis Date  . Hypertension   . GERD (gastroesophageal reflux disease)   . Allergy history, seafood     oysters, mussels  . Allergy     wood sap  . Hypercholesteremia   . Cancer     "skin cancer frozen off"  . Arthritis     neck  . Colon polyps   . Bee sting allergy 1980    hives after 20 yellowjacket stings   Past Surgical History  Procedure Laterality Date  . Carpal tunnel release Right 1985    elbow  . Shoulder arthroscopy with subacromial decompression and open rotator c Right 10/22/2012    Procedure: RIGHT SHOULDER ARTHROSCOPY WITH SUBACROMIAL DECOMPRESSION AND  ROTATOR CUFF REPAIR,  CORACOACROMIAL LIGAMENT RELEASE AND  MANIPULATION UNDER ANESTHESIA;  Surgeon: Johnn Hai, MD;  Location: [facility];  Service: Orthopedics;  Laterality: Right;   Family History   Problem Relation Age of Onset  . Heart failure Mother   . Heart failure Father    History  Substance Use Topics  . Smoking status: Former Smoker -- 1.00 packs/day for 15 years    Types: Cigarettes    Quit date: 03/04/1984  . Smokeless tobacco: Former Systems developer    Types: Chew    Quit date: 03/04/2008  . Alcohol Use: No    Review of Systems  Constitutional: Negative for fever, activity change and appetite change.  Respiratory: Positive for chest tightness. Negative for cough and shortness of breath.   Cardiovascular: Positive for chest pain.  Gastrointestinal: Negative for nausea, vomiting and abdominal pain.  Genitourinary: Negative for dysuria, hematuria and decreased urine volume.  Musculoskeletal: Negative for myalgias, back pain and arthralgias.  Skin: Negative for wound.  Neurological: Negative for dizziness, weakness, numbness and headaches.  A complete 10 system review of systems was obtained and all systems are negative except as noted in the HPI and PMH.      Allergies  Iodine  Home Medications   Prior to Admission medications   Medication Sig Start Date End Date Taking? Authorizing Provider  diphenhydrAMINE (BENADRYL) 25 MG tablet Take 25 mg by mouth once.   Yes Historical Provider, MD  famotidine (PEPCID) 20 MG tablet Take 20 mg by mouth once.   Yes Historical Provider, MD  hydrochlorothiazide (HYDRODIURIL) 25 MG tablet Take 1 tablet (25 mg total) by mouth daily. 05/18/14  Yes Susy Frizzle, MD  levocetirizine Harlow Ohms) 5  MG tablet Take 1 tablet (5 mg total) by mouth every evening. 05/18/14  Yes Susy Frizzle, MD  losartan (COZAAR) 100 MG tablet Take 1 tablet (100 mg total) by mouth every morning. 05/18/14  Yes Susy Frizzle, MD  pantoprazole (PROTONIX) 40 MG tablet Take 1 tablet (40 mg total) by mouth daily. 05/18/14  Yes Susy Frizzle, MD  pravastatin (PRAVACHOL) 40 MG tablet Take 1 tablet (40 mg total) by mouth at bedtime. 05/18/14  Yes Susy Frizzle, MD   tadalafil (CIALIS) 20 MG tablet Take 1 tablet (20 mg total) by mouth as needed. 02/21/14  Yes Susy Frizzle, MD  zafirlukast (ACCOLATE) 20 MG tablet TAKE 1 TABLET TWICE A DAY 01/20/14  Yes Susy Frizzle, MD   BP 136/88 mmHg  Pulse 70  Resp 18  Ht 5' 10.5" (1.791 m)  Wt 212 lb (96.163 kg)  BMI 29.98 kg/m2  SpO2 97% Physical Exam  Constitutional: He is oriented to person, place, and time. He appears well-developed and well-nourished. No distress.  HENT:  Head: Normocephalic and atraumatic.  Mouth/Throat: Oropharynx is clear and moist. No oropharyngeal exudate.  Eyes: Conjunctivae and EOM are normal. Pupils are equal, round, and reactive to light.  Neck: Normal range of motion. Neck supple.  No meningismus.  Cardiovascular: Normal rate, regular rhythm, normal heart sounds and intact distal pulses.   No murmur heard. Pulmonary/Chest: Effort normal and breath sounds normal. No respiratory distress. He exhibits no tenderness.  Chest wall nontender  Abdominal: Soft. There is no tenderness. There is no rebound and no guarding.  Musculoskeletal: Normal range of motion. He exhibits no edema or tenderness.  Neurological: He is alert and oriented to person, place, and time. No cranial nerve deficit. He exhibits normal muscle tone. Coordination normal.  No ataxia on finger to nose bilaterally. No pronator drift. 5/5 strength throughout. CN 2-12 intact. Negative Romberg. Equal grip strength. Sensation intact. Gait is normal.   Skin: Skin is warm.  Psychiatric: He has a normal mood and affect. His behavior is normal.  Nursing note and vitals reviewed.   ED Course  Procedures (including critical care time) Labs Review Labs Reviewed  BASIC METABOLIC PANEL - Abnormal; Notable for the following:    Glucose, Bld 118 (*)    All other components within normal limits  HEPATIC FUNCTION PANEL - Abnormal; Notable for the following:    Total Protein 6.3 (*)    All other components within normal  limits  CBC  LIPASE, BLOOD  TROPONIN I  TROPONIN I  Randolm Idol, ED    Imaging Review Dg Chest 2 View  09/10/2014   CLINICAL DATA:  Chest pain for 1 day.  EXAM: CHEST  2 VIEW  COMPARISON:  10/16/2012 and prior chest radiographs dating back to 01/10/2002  FINDINGS: The cardiomediastinal silhouette is unremarkable.  There is no evidence of focal airspace disease, pulmonary edema, suspicious pulmonary nodule/mass, pleural effusion, or pneumothorax. No acute bony abnormalities are identified.  IMPRESSION: No active cardiopulmonary disease.   Electronically Signed   By: Margarette Canada M.D.   On: 09/10/2014 11:02     EKG Interpretation   Date/Time:  Saturday September 10 2014 09:23:29 EDT Ventricular Rate:  87 PR Interval:  176 QRS Duration: 94 QT Interval:  386 QTC Calculation: 464 R Axis:   28 Text Interpretation:  Normal sinus rhythm Normal ECG No significant change  was found Confirmed by Wyvonnia Dusky  MD, Malynn Lucy ([id number]) on 09/10/2014 9:34:42 AM  MDM   Final diagnoses:  Chest pain, unspecified chest pain type   Central chest pain that does not radiate. Better with bicarbonate, worse with laughing. No shortness of breath or dizziness. No diaphoresis. EKG normal sinus rhythm.  Troponin negative. LFTs and lipase normal. GI cocktail given HEART score 3.  Pain is somewhat atypical for ACS as it is worse with laughing and better with sodium bicarbonate. Pain is resolved after GI cocktail. Doubt PE or aortic dissection.  Discussed with patient that we'll obtain serial troponins. He will benefit from outpatient stress test given his risk factors.  Second troponin negative. No further chest pain in the ED. Patient and wife comfortable with outpatient stress test. Observation admission declined today. Pain somewhat atypical for ACS. Patient to return with worsening symptoms.  Ezequiel Essex, MD 09/10/14 [phone]

## 2014-09-10 NOTE — ED Notes (Signed)
Pt. Stated, i started having what I thought was indigestion but it did not go away.  It will bad then go away and then come back.

## 2014-09-10 NOTE — Discharge Instructions (Signed)
Chest Pain (Nonspecific) Follow up with your doctor for a stress test. Return to the ED if you develop worsening chest pain, shortness of breath or any other concerns. It is often hard to give a specific diagnosis for the cause of chest pain. There is always a chance that your pain could be related to something serious, such as a heart attack or a blood clot in the lungs. You need to follow up with your health care provider for further evaluation. CAUSES   Heartburn.  Pneumonia or bronchitis.  Anxiety or stress.  Inflammation around your heart (pericarditis) or lung (pleuritis or pleurisy).  A blood clot in the lung.  A collapsed lung (pneumothorax). It can develop suddenly on its own (spontaneous pneumothorax) or from trauma to the chest.  Shingles infection (herpes zoster virus). The chest wall is composed of bones, muscles, and cartilage. Any of these can be the source of the pain.  The bones can be bruised by injury.  The muscles or cartilage can be strained by coughing or overwork.  The cartilage can be affected by inflammation and become sore (costochondritis). DIAGNOSIS  Lab tests or other studies may be needed to find the cause of your pain. Your health care provider may have you take a test called an ambulatory electrocardiogram (ECG). An ECG records your heartbeat patterns over a 24-hour period. You may also have other tests, such as:  Transthoracic echocardiogram (TTE). During echocardiography, sound waves are used to evaluate how blood flows through your heart.  Transesophageal echocardiogram (TEE).  Cardiac monitoring. This allows your health care provider to monitor your heart rate and rhythm in real time.  Holter monitor. This is a portable device that records your heartbeat and can help diagnose heart arrhythmias. It allows your health care provider to track your heart activity for several days, if needed.  Stress tests by exercise or by giving medicine that makes  the heart beat faster. TREATMENT   Treatment depends on what may be causing your chest pain. Treatment may include:  Acid blockers for heartburn.  Anti-inflammatory medicine.  Pain medicine for inflammatory conditions.  Antibiotics if an infection is present.  You may be advised to change lifestyle habits. This includes stopping smoking and avoiding alcohol, caffeine, and chocolate.  You may be advised to keep your head raised (elevated) when sleeping. This reduces the chance of acid going backward from your stomach into your esophagus. Most of the time, nonspecific chest pain will improve within 2-3 days with rest and mild pain medicine.  HOME CARE INSTRUCTIONS   If antibiotics were prescribed, take them as directed. Finish them even if you start to feel better.  For the next few days, avoid physical activities that bring on chest pain. Continue physical activities as directed.  Do not use any tobacco products, including cigarettes, chewing tobacco, or electronic cigarettes.  Avoid drinking alcohol.  Only take medicine as directed by your health care provider.  Follow your health care provider's suggestions for further testing if your chest pain does not go away.  Keep any follow-up appointments you made. If you do not go to an appointment, you could develop lasting (chronic) problems with pain. If there is any problem keeping an appointment, call to reschedule. SEEK MEDICAL CARE IF:   Your chest pain does not go away, even after treatment.  You have a rash with blisters on your chest.  You have a fever. SEEK IMMEDIATE MEDICAL CARE IF:   You have increased chest pain or  pain that spreads to your arm, neck, jaw, back, or abdomen.  You have shortness of breath.  You have an increasing cough, or you cough up blood.  You have severe back or abdominal pain.  You feel nauseous or vomit.  You have severe weakness.  You faint.  You have chills. This is an emergency.  Do not wait to see if the pain will go away. Get medical help at once. Call your local emergency services (911 in [place] Do not drive yourself to the hospital. MAKE SURE YOU:   Understand these instructions.  Will watch your condition.  Will get help right away if you are not doing well or get worse. Document Released: 11/28/2004 Document Revised: 02/23/2013 Document Reviewed: 09/24/2007 [facility] Patient Information 2015 [place], [place]. This information is not intended to replace advice given to you by your health care provider. Make sure you discuss any questions you have with your health care provider.

## 2014-09-12 ENCOUNTER — Ambulatory Visit (INDEPENDENT_AMBULATORY_CARE_PROVIDER_SITE_OTHER): Payer: 59 | Admitting: Family Medicine

## 2014-09-12 ENCOUNTER — Encounter: Payer: Self-pay | Admitting: Family Medicine

## 2014-09-12 VITALS — BP 170/98 | HR 76 | Temp 98.6°F | Resp 16 | Ht 71.5 in | Wt 222.0 lb

## 2014-09-12 DIAGNOSIS — K219 Gastro-esophageal reflux disease without esophagitis: Secondary | ICD-10-CM | POA: Diagnosis not present

## 2014-09-12 MED ORDER — RANITIDINE HCL 300 MG PO TABS: 300.0000 mg | ORAL_TABLET | Freq: Every day | ORAL | Status: DC

## 2014-09-12 NOTE — Progress Notes (Signed)
Subjective:    Patient ID: [name], male    DOB: 10-18-1950, 64 y.o.   MRN: [id number]  HPI 08/18/14 Over the last 3 or 4 months, the patient has been smoking 3-4 cigars a day. Thankfully he stopped this. But now he believes that over the last month his upper throat feels swollen. He states that it feels like his trachea is narrowed 20 takes a deep breath in. He denies any dysphagia. He denies food sticking. He denies any difficulty swallowing. There are no palpable masses in his neck. There is no cervical lymphadenopathy. He denies any hemoptysis. He denies any acid reflux. He does have some mild allergy symptoms. He also admits that anxiety could be a contributing factor.  AT that time, my plan was: I explained to the patient I feel this is likely multifactorial. Anxiety is certainly playing a component. There may be some irritation from the cigars.  Postnasal drip from allergies and acid reflux are also likely contributing factors. I would like him to increase proton asked to 40 mg by mouth twice a day and resume his allergy medication. Patient has already stopped smoking. His physical exam is Reassuring. I recommended that we monitor his symptoms for the next 2-3 weeks. If symptoms persist or worsen, I would recommend an ENT evaluation for laryngoscopy. However if symptoms improve no further workup is necessary.  I believe the patient's blood pressure today is due to anxiety. He will check his blood pressure frequently at home and notify me of the values in a few days. Previously it has been well controlled  09/12/14 Patient saw an ear nose and throat physician who performed laryngoscopy and confirmed reflux as a source of his globus sensation. Patient was asked to take protonix 40 mg by mouth twice a day but his insurance will not pay for that. Saturday he developed substernal chest pain that was unrelated to exertion. It was a burning sensation in his chest. This followed the day after he  ate [place] cuisine. He is also aggravated by some acidic food he ate that morning. He went to the emergency room where he was given a GI cocktail and his pain instantly resolved. Since avoiding spicy food the pain has not returned. He denies any angina or shortness of breath. Past Medical History  Diagnosis Date  . Hypertension   . GERD (gastroesophageal reflux disease)   . Allergy history, seafood     oysters, mussels  . Allergy     wood sap  . Hypercholesteremia   . Cancer     "skin cancer frozen off"  . Arthritis     neck  . Colon polyps   . Bee sting allergy 1980    hives after 20 yellowjacket stings   Past Surgical History  Procedure Laterality Date  . Carpal tunnel release Right 1985    elbow  . Shoulder arthroscopy with subacromial decompression and open rotator c Right 10/22/2012    Procedure: RIGHT SHOULDER ARTHROSCOPY WITH SUBACROMIAL DECOMPRESSION AND  ROTATOR CUFF REPAIR,  CORACOACROMIAL LIGAMENT RELEASE AND  MANIPULATION UNDER ANESTHESIA;  Surgeon: Johnn Hai, MD;  Location: [facility];  Service: Orthopedics;  Laterality: Right;   Current Outpatient Prescriptions on File Prior to Visit  Medication Sig Dispense Refill  . diphenhydrAMINE (BENADRYL) 25 MG tablet Take 25 mg by mouth once.    . famotidine (PEPCID) 20 MG tablet Take 20 mg by mouth once.    . hydrochlorothiazide (HYDRODIURIL) 25 MG tablet Take 1  tablet (25 mg total) by mouth daily. 90 tablet 4  . levocetirizine (XYZAL) 5 MG tablet Take 1 tablet (5 mg total) by mouth every evening. 90 tablet 4  . losartan (COZAAR) 100 MG tablet Take 1 tablet (100 mg total) by mouth every morning. 90 tablet 4  . pantoprazole (PROTONIX) 40 MG tablet Take 1 tablet (40 mg total) by mouth daily. 90 tablet 4  . pravastatin (PRAVACHOL) 40 MG tablet Take 1 tablet (40 mg total) by mouth at bedtime. 90 tablet 4  . tadalafil (CIALIS) 20 MG tablet Take 1 tablet (20 mg total) by mouth as needed. 5 tablet 11  . zafirlukast (ACCOLATE) 20 MG  tablet TAKE 1 TABLET TWICE A DAY 60 tablet 5   No current facility-administered medications on file prior to visit.   Allergies  Allergen Reactions  . Iodine     Topical antiseptic-as a young adult. Don't know reaction   History   Social History  . Marital Status: Married    Spouse Name: N/A  . Number of Children: N/A  . Years of Education: N/A   Occupational History  . Not on file.   Social History Main Topics  . Smoking status: Former Smoker -- 1.00 packs/day for 15 years    Types: Cigarettes    Quit date: 03/04/1984  . Smokeless tobacco: Former Systems developer    Types: Chew    Quit date: 03/04/2008  . Alcohol Use: No  . Drug Use: No  . Sexual Activity: Not on file   Other Topics Concern  . Not on file   Social History Narrative     Review of Systems  All other systems reviewed and are negative.      Objective:   Physical Exam  Constitutional: He appears well-developed and well-nourished.  HENT:  Mouth/Throat: Oropharynx is clear and moist. No oropharyngeal exudate.  Neck: Neck supple. No JVD present. No thyromegaly present.  Cardiovascular: Normal rate, regular rhythm and normal heart sounds.   No murmur heard. Pulmonary/Chest: Effort normal and breath sounds normal. No stridor. No respiratory distress. He has no wheezes. He has no rales.  Abdominal: Soft. Bowel sounds are normal. He exhibits no distension. There is no tenderness. There is no rebound.  Lymphadenopathy:    He has no cervical adenopathy.  Vitals reviewed.         Assessment & Plan:  Gastroesophageal reflux disease without esophagitis - Plan: ranitidine (ZANTAC) 300 MG tablet  I believe the patient's pain is related to acid reflux. I believe he has had symptoms related to acid reflux off and on over the last month. He has not been taking Pepcid. Therefore I recommended he start Zantac 300 milligrams by mouth daily at bedtime in addition to the protonix. Recheck in one month. Also resume Cardura  4 mg by mouth daily for hypertension

## 2014-11-07 ENCOUNTER — Other Ambulatory Visit: Payer: Self-pay | Admitting: Family Medicine

## 2014-11-14 ENCOUNTER — Telehealth: Payer: Self-pay | Admitting: Family Medicine

## 2014-11-14 MED ORDER — DOXAZOSIN MESYLATE 4 MG PO TABS: 4.0000 mg | ORAL_TABLET | Freq: Every day | ORAL | Status: DC

## 2014-11-14 NOTE — Telephone Encounter (Signed)
Medication called/sent to requested pharmacy  

## 2014-11-14 NOTE — Telephone Encounter (Signed)
Patient asking for refill of his Doxazosin new prescription  To [facility] to [facility]

## 2015-01-30 ENCOUNTER — Encounter: Payer: 59 | Admitting: Family Medicine

## 2015-01-30 NOTE — Progress Notes (Signed)
Subjective:    Patient ID: [name], male    DOB: 07/19/1950, 64 y.o.   MRN: [id number]  HPI  Past Medical History  Diagnosis Date  . Hypertension   . GERD (gastroesophageal reflux disease)   . Allergy history, seafood     oysters, mussels  . Allergy     wood sap  . Hypercholesteremia   . Cancer     "skin cancer frozen off"  . Arthritis     neck  . Colon polyps   . Bee sting allergy 1980    hives after 20 yellowjacket stings   Past Surgical History  Procedure Laterality Date  . Carpal tunnel release Right 1985    elbow  . Shoulder arthroscopy with subacromial decompression and open rotator c Right 10/22/2012    Procedure: RIGHT SHOULDER ARTHROSCOPY WITH SUBACROMIAL DECOMPRESSION AND  ROTATOR CUFF REPAIR,  CORACOACROMIAL LIGAMENT RELEASE AND  MANIPULATION UNDER ANESTHESIA;  Surgeon: Johnn Hai, MD;  Location: [facility];  Service: Orthopedics;  Laterality: Right;   Current Outpatient Prescriptions on File Prior to Visit  Medication Sig Dispense Refill  . diphenhydrAMINE (BENADRYL) 25 MG tablet Take 25 mg by mouth once.    . doxazosin (CARDURA) 4 MG tablet Take 1 tablet (4 mg total) by mouth daily. 90 tablet 3  . famotidine (PEPCID) 20 MG tablet Take 20 mg by mouth once.    . hydrochlorothiazide (HYDRODIURIL) 25 MG tablet Take 1 tablet (25 mg total) by mouth daily. 90 tablet 4  . levocetirizine (XYZAL) 5 MG tablet Take 1 tablet (5 mg total) by mouth every evening. 90 tablet 4  . losartan (COZAAR) 100 MG tablet Take 1 tablet (100 mg total) by mouth every morning. 90 tablet 4  . pantoprazole (PROTONIX) 40 MG tablet Take 1 tablet (40 mg total) by mouth daily. 90 tablet 4  . pravastatin (PRAVACHOL) 40 MG tablet Take 1 tablet (40 mg total) by mouth at bedtime. 90 tablet 4  . ranitidine (ZANTAC) 300 MG tablet TAKE 1 TABLET (300 MG TOTAL) BY MOUTH AT BEDTIME. 30 tablet 11  . tadalafil (CIALIS) 20 MG tablet Take 1 tablet (20 mg total) by mouth as needed. 5 tablet 11  .  zafirlukast (ACCOLATE) 20 MG tablet TAKE 1 TABLET TWICE A DAY 60 tablet 5   No current facility-administered medications on file prior to visit.   Allergies  Allergen Reactions  . Iodine     Topical antiseptic-as a young adult. Don't know reaction   Social History   Social History  . Marital Status: Married    Spouse Name: N/A  . Number of Children: N/A  . Years of Education: N/A   Occupational History  . Not on file.   Social History Main Topics  . Smoking status: Former Smoker -- 1.00 packs/day for 15 years    Types: Cigarettes    Quit date: 03/04/1984  . Smokeless tobacco: Former Systems developer    Types: Chew    Quit date: 03/04/2008  . Alcohol Use: No  . Drug Use: No  . Sexual Activity: Not on file   Other Topics Concern  . Not on file   Social History Narrative     Review of Systems  All other systems reviewed and are negative.      Objective:   Physical Exam  Constitutional: He appears well-developed and well-nourished.  HENT:  Mouth/Throat: Oropharynx is clear and moist. No oropharyngeal exudate.  Neck: Neck supple. No JVD present. No thyromegaly present.  Cardiovascular: Normal rate, regular rhythm and normal heart sounds.   No murmur heard. Pulmonary/Chest: Effort normal and breath sounds normal. No stridor. No respiratory distress. He has no wheezes. He has no rales.  Abdominal: Soft. Bowel sounds are normal. He exhibits no distension. There is no tenderness. There is no rebound.  Lymphadenopathy:    He has no cervical adenopathy.  Vitals reviewed.         Assessment & Plan:   This encounter was created in error - please disregard.

## 2015-04-17 ENCOUNTER — Encounter: Payer: 59 | Admitting: Family Medicine

## 2015-04-18 ENCOUNTER — Other Ambulatory Visit: Payer: Self-pay | Admitting: Family Medicine

## 2015-04-18 ENCOUNTER — Other Ambulatory Visit: Payer: 59

## 2015-04-18 DIAGNOSIS — I10 Essential (primary) hypertension: Secondary | ICD-10-CM

## 2015-04-18 DIAGNOSIS — Z79899 Other long term (current) drug therapy: Secondary | ICD-10-CM

## 2015-04-18 DIAGNOSIS — E785 Hyperlipidemia, unspecified: Secondary | ICD-10-CM

## 2015-04-18 LAB — COMPLETE METABOLIC PANEL WITHOUT GFR
ALT: 21 U/L (ref 9–46)
AST: 22 U/L (ref 10–35)
Albumin: 3.7 g/dL (ref 3.6–5.1)
Alkaline Phosphatase: 54 U/L (ref 40–115)
BUN: 20 mg/dL (ref 7–25)
CO2: 30 mmol/L (ref 20–31)
Calcium: 9.5 mg/dL (ref 8.6–10.3)
Chloride: 106 mmol/L (ref 98–110)
Creat: 0.84 mg/dL (ref 0.70–1.25)
GFR, Est African American: >89 mL/min
GFR, Est Non African American: >89 mL/min
Glucose, Bld: 95 mg/dL (ref 70–99)
Potassium: 4 mmol/L (ref 3.5–5.3)
Sodium: 144 mmol/L (ref 135–146)
Total Bilirubin: 0.4 mg/dL (ref 0.2–1.2)
Total Protein: 6 g/dL — ABNORMAL LOW (ref 6.1–8.1)

## 2015-04-18 LAB — CBC WITH DIFFERENTIAL/PLATELET
Basophils Absolute: 0.1 K/uL (ref 0.0–0.1)
Basophils Relative: 1 % (ref 0–1)
Eosinophils Absolute: 0.2 K/uL (ref 0.0–0.7)
Eosinophils Relative: 3 % (ref 0–5)
HCT: 45.1 % (ref 39.0–52.0)
Hemoglobin: 15.5 g/dL (ref 13.0–17.0)
Lymphocytes Relative: 16 % (ref 12–46)
Lymphs Abs: 1.2 K/uL (ref 0.7–4.0)
MCH: 32.3 pg (ref 26.0–34.0)
MCHC: 34.4 g/dL (ref 30.0–36.0)
MCV: 94 fL (ref 78.0–100.0)
MPV: 10.3 fL (ref 8.6–12.4)
Monocytes Absolute: 0.8 K/uL (ref 0.1–1.0)
Monocytes Relative: 10 % (ref 3–12)
Neutro Abs: 5.3 K/uL (ref 1.7–7.7)
Neutrophils Relative %: 70 % (ref 43–77)
Platelets: 258 K/uL (ref 150–400)
RBC: 4.8 MIL/uL (ref 4.22–5.81)
RDW: 13.4 % (ref 11.5–15.5)
WBC: 7.5 K/uL (ref 4.0–10.5)

## 2015-04-18 LAB — LIPID PANEL
Cholesterol: 144 mg/dL (ref 125–200)
HDL: 30 mg/dL — AB (ref >=40)
LDL Cholesterol: 96 mg/dL (ref <130)
Total CHOL/HDL Ratio: 4.8 Ratio (ref <=5.0)
Triglycerides: 88 mg/dL (ref <150)
VLDL: 18 mg/dL (ref <30)

## 2015-04-19 ENCOUNTER — Encounter: Payer: Self-pay | Admitting: Physician Assistant

## 2015-04-19 ENCOUNTER — Other Ambulatory Visit: Payer: Self-pay | Admitting: Physician Assistant

## 2015-04-19 ENCOUNTER — Ambulatory Visit (INDEPENDENT_AMBULATORY_CARE_PROVIDER_SITE_OTHER): Payer: 59 | Admitting: Physician Assistant

## 2015-04-19 VITALS — BP 152/86 | HR 88 | Temp 98.7°F | Resp 18 | Wt 217.0 lb

## 2015-04-19 DIAGNOSIS — J111 Influenza due to unidentified influenza virus with other respiratory manifestations: Secondary | ICD-10-CM | POA: Diagnosis not present

## 2015-04-19 DIAGNOSIS — Z20828 Contact with and (suspected) exposure to other viral communicable diseases: Secondary | ICD-10-CM

## 2015-04-19 MED ORDER — OSELTAMIVIR PHOSPHATE 75 MG PO CAPS: 75.0000 mg | ORAL_CAPSULE | Freq: Two times a day (BID) | ORAL | Status: DC

## 2015-04-19 NOTE — Progress Notes (Signed)
Patient ID: [name] MRN: [id number], DOB: 08/25/1950, 65 y.o. Date of Encounter: 04/19/2015, 10:24 AM    Chief Complaint:  Chief Complaint  Patient presents with  . wife home + flu    started feeling symptoms last night     HPI: 65 y.o. year old male presents with above.   Says that his wife has been sick. Says "she has the flu" but says she has not had test for the flu. Says that she always has a cough so it is hard to tell how much increased cough she has. Says that she has had fever 101.4. says that she was unable to get the flu shot because she has allergy / contraindications to flu shot.  Pt states that he did not get a flu shot this year. Says that he started developing symptoms last night. Cough. No known fever that he knows of so far. Minimal sore throat. Minimal sneezing. He states that he has been trying to stay away from his wife said he would not get sick but feels that he is coming down with the same symptoms that she has. They think that it is the flu.     Home Meds:   Outpatient Prescriptions Prior to Visit  Medication Sig Dispense Refill  . diphenhydrAMINE (BENADRYL) 25 MG tablet Take 25 mg by mouth once.    . doxazosin (CARDURA) 4 MG tablet Take 1 tablet (4 mg total) by mouth daily. 90 tablet 3  . famotidine (PEPCID) 20 MG tablet Take 20 mg by mouth once.    . hydrochlorothiazide (HYDRODIURIL) 25 MG tablet Take 1 tablet (25 mg total) by mouth daily. 90 tablet 4  . levocetirizine (XYZAL) 5 MG tablet Take 1 tablet (5 mg total) by mouth every evening. 90 tablet 4  . losartan (COZAAR) 100 MG tablet Take 1 tablet (100 mg total) by mouth every morning. 90 tablet 4  . pantoprazole (PROTONIX) 40 MG tablet Take 1 tablet (40 mg total) by mouth daily. 90 tablet 4  . pravastatin (PRAVACHOL) 40 MG tablet Take 1 tablet (40 mg total) by mouth at bedtime. 90 tablet 4  . ranitidine (ZANTAC) 300 MG tablet TAKE 1 TABLET (300 MG TOTAL) BY MOUTH AT BEDTIME. 30 tablet 11    . tadalafil (CIALIS) 20 MG tablet Take 1 tablet (20 mg total) by mouth as needed. 5 tablet 11  . zafirlukast (ACCOLATE) 20 MG tablet TAKE 1 TABLET TWICE A DAY 60 tablet 5   No facility-administered medications prior to visit.    Allergies:  Allergies  Allergen Reactions  . Iodine     Topical antiseptic-as a young adult. Don't know reaction      Review of Systems: See HPI for pertinent ROS. All other ROS negative.    Physical Exam: Blood pressure 152/86, pulse 88, temperature 98.7 F (37.1 C), temperature source Oral, resp. rate 18, weight 217 lb (98.431 kg)., Body mass index is 29.85 kg/(m^2). General:  WNWD WM. Appears in no acute distress. HEENT: Normocephalic, atraumatic, eyes without discharge, sclera non-icteric, nares are without discharge. Bilateral auditory canals clear, TM's are without perforation, pearly grey and translucent with reflective cone of light bilaterally. Oral cavity moist, posterior pharynx without exudate, erythema, peritonsillar abscess.  Neck: Supple. No thyromegaly. No lymphadenopathy. Lungs: Clear bilaterally to auscultation without wheezes, rales, or rhonchi. Breathing is unlabored. Heart: Regular rhythm. No murmurs, rubs, or gallops. Msk:  Strength and tone normal for age. Extremities/Skin: Warm and dry.  No rashes. Neuro:  Alert and oriented X 3. Moves all extremities spontaneously. Gait is normal. CNII-XII grossly in tact. Psych:  Responds to questions appropriately with a normal affect.     ASSESSMENT AND PLAN:  65 y.o. year old male with  1. Influenza Will go ahead and treat as the flu. Start Tamiflu immediately, take as directed and complete all of it. Can also use Tylenol or Motrin if develops fever or for aches and pains. Use over-the-counter cough medications and decongestants as needed for symptom relief. F/U if symptoms worsen significantly. - oseltamivir (TAMIFLU) 75 MG capsule; Take 1 capsule (75 mg total) by mouth 2 (two) times daily.   Dispense: 10 capsule; Refill: 0  2. Exposure to influenza - Influenza a and b   Signed, Olean Ree [place], [place], [facility] 04/19/2015 10:24 AM

## 2015-04-24 ENCOUNTER — Encounter: Payer: Self-pay | Admitting: Family Medicine

## 2015-04-24 ENCOUNTER — Ambulatory Visit (INDEPENDENT_AMBULATORY_CARE_PROVIDER_SITE_OTHER): Payer: 59 | Admitting: Family Medicine

## 2015-04-24 VITALS — BP 162/94 | HR 88 | Temp 98.2°F | Resp 16 | Ht 71.5 in | Wt 214.0 lb

## 2015-04-24 DIAGNOSIS — Z0000 Encounter for general adult medical examination without abnormal findings: Secondary | ICD-10-CM

## 2015-04-24 DIAGNOSIS — I10 Essential (primary) hypertension: Secondary | ICD-10-CM

## 2015-04-24 MED ORDER — PRAVASTATIN SODIUM 40 MG PO TABS: 40.0000 mg | ORAL_TABLET | Freq: Every day | ORAL | Status: DC

## 2015-04-24 MED ORDER — DOXAZOSIN MESYLATE 4 MG PO TABS: 4.0000 mg | ORAL_TABLET | Freq: Every day | ORAL | Status: DC

## 2015-04-24 MED ORDER — HYDROCHLOROTHIAZIDE 25 MG PO TABS: 25.0000 mg | ORAL_TABLET | Freq: Every day | ORAL | Status: DC

## 2015-04-24 MED ORDER — PANTOPRAZOLE SODIUM 40 MG PO TBEC: 40.0000 mg | DELAYED_RELEASE_TABLET | Freq: Every day | ORAL | Status: DC

## 2015-04-24 MED ORDER — AMLODIPINE BESYLATE 10 MG PO TABS: 10.0000 mg | ORAL_TABLET | Freq: Every day | ORAL | Status: DC

## 2015-04-24 MED ORDER — LEVOCETIRIZINE DIHYDROCHLORIDE 5 MG PO TABS: 5.0000 mg | ORAL_TABLET | Freq: Every evening | ORAL | Status: DC

## 2015-04-24 MED ORDER — LOSARTAN POTASSIUM 100 MG PO TABS: 100.0000 mg | ORAL_TABLET | Freq: Every morning | ORAL | Status: DC

## 2015-04-24 NOTE — Addendum Note (Signed)
Addended by: Shary Decamp B on: 04/24/2015 12:32 PM   Modules accepted: Orders, Medications

## 2015-04-24 NOTE — Progress Notes (Signed)
Subjective:    Patient ID: [name], male    DOB: 11-10-1950, 65 y.o.   MRN: [id number]  HPI   Patient is here today for complete physical exam. He has no specific concerns. His last colonoscopy was in 2014 and was significant for 2 tubular adenomas. He is due for repeat colonoscopy in 2019. He is due for his prostate exam today. He is also due for his flu shot. His tetanus vaccine and the shingles shot are up-to-date. Since his last physical exam, he admits to dietary indiscretion and he has gained weight. With this his blood pressure has increased. Lab on 04/18/2015  Component Date Value Ref Range Status  . Sodium 04/18/2015 144  135 - 146 mmol/L Final  . Potassium 04/18/2015 4.0  3.5 - 5.3 mmol/L Final  . Chloride 04/18/2015 106  98 - 110 mmol/L Final  . CO2 04/18/2015 30  20 - 31 mmol/L Final  . Glucose, Bld 04/18/2015 95  70 - 99 mg/dL Final  . BUN 04/18/2015 20  7 - 25 mg/dL Final  . Creat 04/18/2015 0.84  0.70 - 1.25 mg/dL Final  . Total Bilirubin 04/18/2015 0.4  0.2 - 1.2 mg/dL Final  . Alkaline Phosphatase 04/18/2015 54  40 - 115 U/L Final  . AST 04/18/2015 22  10 - 35 U/L Final  . ALT 04/18/2015 21  9 - 46 U/L Final  . Total Protein 04/18/2015 6.0* 6.1 - 8.1 g/dL Final  . Albumin 04/18/2015 3.7  3.6 - 5.1 g/dL Final  . Calcium 04/18/2015 9.5  8.6 - 10.3 mg/dL Final  . GFR, Est African American 04/18/2015 >89  >=60 mL/min Final  . GFR, Est Non African American 04/18/2015 >89  >=60 mL/min Final   Comment:   The estimated GFR is a calculation valid for adults (>=85 years old) that uses the CKD-EPI algorithm to adjust for age and sex. It is   not to be used for children, pregnant women, hospitalized patients,    patients on dialysis, or with rapidly changing kidney function. According to the NKDEP, eGFR >89 is normal, 60-89 shows mild impairment, 30-59 shows moderate impairment, 15-29 shows severe impairment and <15 is ESRD.     Marland Kitchen Cholesterol 04/18/2015 144  125  - 200 mg/dL Final  . Triglycerides 04/18/2015 88  <150 mg/dL Final  . HDL 04/18/2015 30* >=40 mg/dL Final  . Total CHOL/HDL Ratio 04/18/2015 4.8  <=5.0 Ratio Final  . VLDL 04/18/2015 18  <30 mg/dL Final  . LDL Cholesterol 04/18/2015 96  <130 mg/dL Final   Comment:   Total Cholesterol/HDL Ratio:CHD Risk                        Coronary Heart Disease Risk Table                                        Men       Women          1/2 Average Risk              3.4        3.3              Average Risk              5.0        4.4  2X Average Risk              9.6        7.1           3X Average Risk             23.4       11.0 Use the calculated Patient Ratio above and the CHD Risk table  to determine the patient's CHD Risk.   . WBC 04/18/2015 7.5  4.0 - 10.5 K/uL Final  . RBC 04/18/2015 4.80  4.22 - 5.81 MIL/uL Final  . Hemoglobin 04/18/2015 15.5  13.0 - 17.0 g/dL Final  . HCT 58/31/6742 45.1  39.0 - 52.0 % Final  . MCV 04/18/2015 94.0  78.0 - 100.0 fL Final  . MCH 04/18/2015 32.3  26.0 - 34.0 pg Final  . MCHC 04/18/2015 34.4  30.0 - 36.0 g/dL Final  . RDW 55/25/8948 13.4  11.5 - 15.5 % Final  . Platelets 04/18/2015 258  150 - 400 K/uL Final  . MPV 04/18/2015 10.3  8.6 - 12.4 fL Final  . Neutrophils Relative % 04/18/2015 70  43 - 77 % Final  . Neutro Abs 04/18/2015 5.3  1.7 - 7.7 K/uL Final  . Lymphocytes Relative 04/18/2015 16  12 - 46 % Final  . Lymphs Abs 04/18/2015 1.2  0.7 - 4.0 K/uL Final  . Monocytes Relative 04/18/2015 10  3 - 12 % Final  . Monocytes Absolute 04/18/2015 0.8  0.1 - 1.0 K/uL Final  . Eosinophils Relative 04/18/2015 3  0 - 5 % Final  . Eosinophils Absolute 04/18/2015 0.2  0.0 - 0.7 K/uL Final  . Basophils Relative 04/18/2015 1  0 - 1 % Final  . Basophils Absolute 04/18/2015 0.1  0.0 - 0.1 K/uL Final  . Smear Review 04/18/2015 Criteria for review not met   Final   CMP and PSA were checked in November and were excellent Past Medical History  Diagnosis Date    . Hypertension   . GERD (gastroesophageal reflux disease)   . Allergy history, seafood     oysters, mussels  . Allergy     wood sap  . Hypercholesteremia   . Cancer (HCC)     "skin cancer frozen off"  . Arthritis     neck  . Colon polyps   . Bee sting allergy 1980    hives after 20 yellowjacket stings   Past Surgical History  Procedure Laterality Date  . Carpal tunnel release Right 1985    elbow  . Shoulder arthroscopy with subacromial decompression and open rotator c Right 10/22/2012    Procedure: RIGHT SHOULDER ARTHROSCOPY WITH SUBACROMIAL DECOMPRESSION AND  ROTATOR CUFF REPAIR,  CORACOACROMIAL LIGAMENT RELEASE AND  MANIPULATION UNDER ANESTHESIA;  Surgeon: Javier Docker, MD;  Location: [facility];  Service: Orthopedics;  Laterality: Right;   Current Outpatient Prescriptions on File Prior to Visit  Medication Sig Dispense Refill  . diphenhydrAMINE (BENADRYL) 25 MG tablet Take 25 mg by mouth once.    . doxazosin (CARDURA) 4 MG tablet Take 1 tablet (4 mg total) by mouth daily. 90 tablet 3  . famotidine (PEPCID) 20 MG tablet Take 20 mg by mouth once.    . hydrochlorothiazide (HYDRODIURIL) 25 MG tablet Take 1 tablet (25 mg total) by mouth daily. 90 tablet 4  . levocetirizine (XYZAL) 5 MG tablet Take 1 tablet (5 mg total) by mouth every evening. 90 tablet 4  . losartan (COZAAR) 100 MG tablet Take 1  tablet (100 mg total) by mouth every morning. 90 tablet 4  . oseltamivir (TAMIFLU) 75 MG capsule Take 1 capsule (75 mg total) by mouth 2 (two) times daily. 10 capsule 0  . pantoprazole (PROTONIX) 40 MG tablet Take 1 tablet (40 mg total) by mouth daily. 90 tablet 4  . pravastatin (PRAVACHOL) 40 MG tablet Take 1 tablet (40 mg total) by mouth at bedtime. 90 tablet 4  . ranitidine (ZANTAC) 300 MG tablet TAKE 1 TABLET (300 MG TOTAL) BY MOUTH AT BEDTIME. 30 tablet 11  . tadalafil (CIALIS) 20 MG tablet Take 1 tablet (20 mg total) by mouth as needed. 5 tablet 11  . zafirlukast (ACCOLATE) 20 MG tablet  TAKE 1 TABLET TWICE A DAY 60 tablet 5   No current facility-administered medications on file prior to visit.   Allergies  Allergen Reactions  . Iodine     Topical antiseptic-as a young adult. Don't know reaction   Social History   Social History  . Marital Status: Married    Spouse Name: N/A  . Number of Children: N/A  . Years of Education: N/A   Occupational History  . Not on file.   Social History Main Topics  . Smoking status: Former Smoker -- 1.00 packs/day for 15 years    Types: Cigarettes    Quit date: 03/04/1984  . Smokeless tobacco: Former Systems developer    Types: Chew    Quit date: 03/04/2008  . Alcohol Use: No  . Drug Use: No  . Sexual Activity: Not on file   Other Topics Concern  . Not on file   Social History Narrative   Family History  Problem Relation Age of Onset  . Heart failure Mother   . Heart failure Father      Review of Systems  All other systems reviewed and are negative.      Objective:   Physical Exam  Constitutional: He is oriented to person, place, and time. He appears well-developed and well-nourished. No distress.  HENT:  Head: Normocephalic and atraumatic.  Right Ear: External ear normal.  Left Ear: External ear normal.  Nose: Nose normal.  Mouth/Throat: Oropharynx is clear and moist. No oropharyngeal exudate.  Eyes: Conjunctivae and EOM are normal. Pupils are equal, round, and reactive to light. Right eye exhibits no discharge. Left eye exhibits no discharge. No scleral icterus.  Neck: Normal range of motion. Neck supple. No JVD present. No tracheal deviation present. No thyromegaly present.  Cardiovascular: Normal rate, regular rhythm, normal heart sounds and intact distal pulses.  Exam reveals no gallop and no friction rub.   No murmur heard. Pulmonary/Chest: Effort normal and breath sounds normal. No stridor. No respiratory distress. He has no wheezes. He has no rales. He exhibits no tenderness.  Abdominal: Soft. Bowel sounds are  normal. He exhibits no distension and no mass. There is no tenderness. There is no rebound and no guarding.  Musculoskeletal: Normal range of motion. He exhibits no edema or tenderness.  Lymphadenopathy:    He has no cervical adenopathy.  Neurological: He is alert and oriented to person, place, and time. He has normal reflexes. No cranial nerve deficit. He exhibits normal muscle tone. Coordination normal.  Skin: Skin is warm. No rash noted. He is not diaphoretic. No erythema. No pallor.  Psychiatric: He has a normal mood and affect. His behavior is normal. Judgment and thought content normal.  Vitals reviewed.         Assessment & Plan:  Benign essential HTN -  Plan: amLODipine (NORVASC) 10 MG tablet  Routine general medical examination at a health care facility  Physical exam is completely normal. Add amlodipine 10 mg by mouth daily for hypertension. Recheck blood pressure in one month. I will also add a PSA to his lab work. The remainder of his lab work is excellent. Regular preventative care is discussed. His cancer screening is up-to-date. He did receive his flu shot today.

## 2015-04-25 LAB — PSA: PSA: 0.62 ng/mL (ref <=4.00)

## 2015-05-31 ENCOUNTER — Other Ambulatory Visit: Payer: Self-pay | Admitting: Family Medicine

## 2015-05-31 DIAGNOSIS — I10 Essential (primary) hypertension: Secondary | ICD-10-CM

## 2015-05-31 MED ORDER — AMLODIPINE BESYLATE 10 MG PO TABS: 10.0000 mg | ORAL_TABLET | Freq: Every day | ORAL | Status: DC

## 2015-07-25 LAB — INFLUENZA A AND B AG, IMMUNOASSAY
Influenza A Antigen: NOT DETECTED
Influenza B Antigen: NOT DETECTED

## 2015-08-29 ENCOUNTER — Telehealth: Payer: Self-pay | Admitting: Family Medicine

## 2015-08-29 NOTE — Telephone Encounter (Signed)
Needs to be evalute go to an Urgent Care If he refuses use OTC cortisone

## 2015-08-29 NOTE — Telephone Encounter (Signed)
Patient is calling from out of town in [place], stating he has a rash that started out as a bug bite, would like to know if he could get something called in for this is possible  To [facility] [place] phone number is [phone] and is on [address]  If any questions please call him at [phone]

## 2015-08-29 NOTE — Telephone Encounter (Signed)
Patient aware of providers recommendations. And appt made for monday

## 2015-09-04 ENCOUNTER — Encounter: Payer: Self-pay | Admitting: Family Medicine

## 2015-09-04 ENCOUNTER — Ambulatory Visit (INDEPENDENT_AMBULATORY_CARE_PROVIDER_SITE_OTHER): Payer: 59 | Admitting: Family Medicine

## 2015-09-04 VITALS — BP 144/80 | HR 68 | Temp 98.6°F | Resp 14 | Ht 72.0 in | Wt 193.0 lb

## 2015-09-04 DIAGNOSIS — L200 Besnier's prurigo: Secondary | ICD-10-CM | POA: Diagnosis not present

## 2015-09-04 DIAGNOSIS — L239 Allergic contact dermatitis, unspecified cause: Secondary | ICD-10-CM

## 2015-09-04 MED ORDER — METHYLPREDNISOLONE ACETATE 40 MG/ML IJ SUSP
40.0000 mg | Freq: Once | INTRAMUSCULAR | Status: AC
  Administered 2015-09-04: 40 mg via INTRAMUSCULAR

## 2015-09-04 MED ORDER — PREDNISONE 20 MG PO TABS: 40.0000 mg | ORAL_TABLET | Freq: Every day | ORAL | Status: DC

## 2015-09-04 NOTE — Patient Instructions (Signed)
Take the prednisone as prescribed  Prednisone as prescribed - starting Tuesday- take in the morning F/U as needed

## 2015-09-04 NOTE — Progress Notes (Signed)
Patient ID: [name], male   DOB: 12/22/1950, 65 y.o.   MRN: [id number]    Subjective:    Patient ID: [name], male    DOB: 08/10/1950, 65 y.o.   MRN: [id number]  Patient presents for Skin Irritation And called last week on the 27th he was out of town stated he had bug bite and then a rash broke out after that. I recommended that he use over-the-counter cortisone or go to the nearest urgent care until he could be seen. He now states that her rashes came up on his ankle region and then started migrating all over ( back, abdomen, arms)  they're pruritic. He had 2 blisters on his right foot he is not sure if that was actually involved in a rash. He has not had any fever and chills no GI symptoms no sick contacts. He is not in any poison ivy does not have any new medications as last one was back in February which was amlodipine. He is broken out during the summertime multiple times before has not noted a culprit. He has used topical Benadryl he's already on Xyzal for allergies he also uses cortisone     Review Of Systems:  GEN- denies fatigue, fever, weight loss,weakness, recent illness HEENT- denies eye drainage, change in vision, nasal discharge, CVS- denies chest pain, palpitations RESP- denies SOB, cough, wheeze ABD- denies N/V, change in stools, abd pain GU- denies dysuria, hematuria, dribbling, incontinence MSK- denies joint pain, muscle aches, injury Neuro- denies headache, dizziness, syncope, seizure activity       Objective:    BP 144/80 mmHg  Pulse 68  Temp(Src) 98.6 F (37 C) (Oral)  Resp 14  Ht 6' (1.829 m)  Wt 193 lb (87.544 kg)  BMI 26.17 kg/m2 GEN- NAD, alert and oriented x3 HEENT- PERRL, EOMI, non injected sclera, pink conjunctiva, MMM, oropharynx clear Neck- Supple, no LAD  CVS- RRR, no murmur RESP-CTAB Skin- hive like rash on abdomen, lower back, scabs on ankle region, scabs and  Mild erythema upper arms +excoriations, 2 flat blister regions  on foot, no induration, no fluctuance EXT- No edema Pulses- Radial, DP- 2+        Assessment & Plan:      Problem List Items Addressed This Visit    None    Visit Diagnoses    Allergic dermatitis    -  Primary    uknown cause, seems to break out during summer, so likley some contact dermatitis. Will given Depo Medrol, prednisone burst, contiue allergy meds       Note: This dictation was prepared with Dragon dictation along with smaller phrase technology. Any transcriptional errors that result from this process are unintentional.

## 2015-09-22 ENCOUNTER — Ambulatory Visit (INDEPENDENT_AMBULATORY_CARE_PROVIDER_SITE_OTHER): Payer: 59 | Admitting: Family Medicine

## 2015-09-22 ENCOUNTER — Encounter: Payer: Self-pay | Admitting: Family Medicine

## 2015-09-22 VITALS — BP 130/66 | HR 76 | Temp 98.1°F | Resp 16 | Ht 71.5 in | Wt 199.0 lb

## 2015-09-22 DIAGNOSIS — S46812A Strain of other muscles, fascia and tendons at shoulder and upper arm level, left arm, initial encounter: Secondary | ICD-10-CM | POA: Diagnosis not present

## 2015-09-22 MED ORDER — CYCLOBENZAPRINE HCL 10 MG PO TABS: 10.0000 mg | ORAL_TABLET | Freq: Three times a day (TID) | ORAL | Status: DC | PRN

## 2015-09-22 MED ORDER — MELOXICAM 15 MG PO TABS: 15.0000 mg | ORAL_TABLET | Freq: Every day | ORAL | Status: DC

## 2015-09-22 NOTE — Progress Notes (Signed)
Subjective:    Patient ID: [name], male    DOB: 04/30/1950, 65 y.o.   MRN: [id number]  HPI Patient presents today with pain just medial to his shoulder blade on the left side. It is worse with resisted rotation of the neck to the left side. There is no pain with empty can sign. There is no pain with Hawkins maneuver. There is no pain with subscapularis liftoff. He has full internal and external rotation of the left shoulder. He has no pain with lateral latissimus dorsi pull down challenge. He is point tender in the trapezius muscle. Past Medical History  Diagnosis Date  . Hypertension   . GERD (gastroesophageal reflux disease)   . Allergy history, seafood     oysters, mussels  . Allergy     wood sap  . Hypercholesteremia   . Cancer ([facility] [place])     "skin cancer frozen off"  . Arthritis     neck  . Colon polyps   . Bee sting allergy 1980    hives after 20 yellowjacket stings   Past Surgical History  Procedure Laterality Date  . Carpal tunnel release Right 1985    elbow  . Shoulder arthroscopy with subacromial decompression and open rotator c Right 10/22/2012    Procedure: RIGHT SHOULDER ARTHROSCOPY WITH SUBACROMIAL DECOMPRESSION AND  ROTATOR CUFF REPAIR,  CORACOACROMIAL LIGAMENT RELEASE AND  MANIPULATION UNDER ANESTHESIA;  Surgeon: Johnn Hai, MD;  Location: [facility];  Service: Orthopedics;  Laterality: Right;   Current Outpatient Prescriptions on File Prior to Visit  Medication Sig Dispense Refill  . amLODipine (NORVASC) 10 MG tablet Take 1 tablet (10 mg total) by mouth daily. 90 tablet 3  . doxazosin (CARDURA) 4 MG tablet Take 1 tablet (4 mg total) by mouth daily. 90 tablet 4  . hydrochlorothiazide (HYDRODIURIL) 25 MG tablet Take 1 tablet (25 mg total) by mouth daily. 90 tablet 4  . levocetirizine (XYZAL) 5 MG tablet Take 1 tablet (5 mg total) by mouth every evening. 90 tablet 4  . losartan (COZAAR) 100 MG tablet Take 1 tablet (100 mg total) by mouth every morning. 90  tablet 4  . pantoprazole (PROTONIX) 40 MG tablet Take 1 tablet (40 mg total) by mouth daily. 90 tablet 4  . pravastatin (PRAVACHOL) 40 MG tablet Take 1 tablet (40 mg total) by mouth at bedtime. 90 tablet 4  . ranitidine (ZANTAC) 300 MG tablet TAKE 1 TABLET (300 MG TOTAL) BY MOUTH AT BEDTIME. 30 tablet 11   No current facility-administered medications on file prior to visit.   Allergies  Allergen Reactions  . Iodine     Topical antiseptic-as a young adult. Don't know reaction   Social History   Social History  . Marital Status: Married    Spouse Name: N/A  . Number of Children: N/A  . Years of Education: N/A   Occupational History  . Not on file.   Social History Main Topics  . Smoking status: Former Smoker -- 1.00 packs/day for 15 years    Types: Cigarettes    Quit date: 03/04/1984  . Smokeless tobacco: Former Systems developer    Types: Chew    Quit date: 03/04/2008  . Alcohol Use: No  . Drug Use: No  . Sexual Activity: Not on file   Other Topics Concern  . Not on file   Social History Narrative      Review of Systems  All other systems reviewed and are negative.  Objective:   Physical Exam  Cardiovascular: Normal rate, regular rhythm and normal heart sounds.   Pulmonary/Chest: Effort normal and breath sounds normal.  Musculoskeletal:       Left shoulder: Normal. He exhibits normal range of motion, no tenderness, no bony tenderness, no swelling, no effusion, no crepitus, no pain, no spasm and normal strength.       Cervical back: Normal.       Thoracic back: He exhibits tenderness, pain and spasm. He exhibits normal range of motion and no bony tenderness.  Vitals reviewed.         Assessment & Plan:  Trapezius strain, left, initial encounter - Plan: cyclobenzaprine (FLEXERIL) 10 MG tablet, meloxicam (MOBIC) 15 MG tablet  Patient appears to strain his trapezius muscle. I recommended meloxicam 15 mg by mouth daily for 2 weeks. I recommended Flexeril 5-10 mg  every 8 hours as needed for muscle spasms. I recommended range of motion exercises to increase flexibility in his neck and in his upper back. I recommended heat be applied to the area for comfort. Recheck in 2 weeks if no better or sooner if worse

## 2015-12-27 ENCOUNTER — Ambulatory Visit (INDEPENDENT_AMBULATORY_CARE_PROVIDER_SITE_OTHER): Payer: 59 | Admitting: Family Medicine

## 2015-12-27 DIAGNOSIS — Z23 Encounter for immunization: Secondary | ICD-10-CM

## 2016-03-11 ENCOUNTER — Ambulatory Visit (INDEPENDENT_AMBULATORY_CARE_PROVIDER_SITE_OTHER): Payer: 59 | Admitting: Physician Assistant

## 2016-03-11 ENCOUNTER — Encounter: Payer: Self-pay | Admitting: Physician Assistant

## 2016-03-11 VITALS — BP 126/78 | HR 72 | Temp 97.9°F | Resp 16 | Wt 232.2 lb

## 2016-03-11 DIAGNOSIS — B9689 Other specified bacterial agents as the cause of diseases classified elsewhere: Secondary | ICD-10-CM

## 2016-03-11 DIAGNOSIS — J988 Other specified respiratory disorders: Secondary | ICD-10-CM | POA: Diagnosis not present

## 2016-03-11 MED ORDER — AZITHROMYCIN 250 MG PO TABS
ORAL_TABLET | ORAL | 0 refills | Status: DC
Start: 2016-03-11 — End: 2016-04-29

## 2016-03-11 NOTE — Progress Notes (Signed)
Patient ID: GUSTIN EKDAHL MRN: [id number], DOB: 1950-04-05, 66 y.o. Date of Encounter: 03/11/2016, 12:31 PM    Chief Complaint:  Chief Complaint  Patient presents with  . Sinusitis    x9days  . Cough  . congested     HPI: 66 y.o. year old male presents with above.   As he has been sick for 9 days now. Says that he is having a lot of congestion in his head and nose but also some in his chest. No significant sore throat or ear pain no fevers or chills. He thinks someday quell and some NyQuil. Symptoms are worse when he first wakes up in the morning.     Home Meds:   Outpatient Medications Prior to Visit  Medication Sig Dispense Refill  . amLODipine (NORVASC) 10 MG tablet Take 1 tablet (10 mg total) by mouth daily. 90 tablet 3  . cyclobenzaprine (FLEXERIL) 10 MG tablet Take 1 tablet (10 mg total) by mouth 3 (three) times daily as needed for muscle spasms. 30 tablet 0  . doxazosin (CARDURA) 4 MG tablet Take 1 tablet (4 mg total) by mouth daily. 90 tablet 4  . hydrochlorothiazide (HYDRODIURIL) 25 MG tablet Take 1 tablet (25 mg total) by mouth daily. 90 tablet 4  . levocetirizine (XYZAL) 5 MG tablet Take 1 tablet (5 mg total) by mouth every evening. 90 tablet 4  . losartan (COZAAR) 100 MG tablet Take 1 tablet (100 mg total) by mouth every morning. 90 tablet 4  . meloxicam (MOBIC) 15 MG tablet Take 1 tablet (15 mg total) by mouth daily. 30 tablet 0  . pantoprazole (PROTONIX) 40 MG tablet Take 1 tablet (40 mg total) by mouth daily. 90 tablet 4  . pravastatin (PRAVACHOL) 40 MG tablet Take 1 tablet (40 mg total) by mouth at bedtime. 90 tablet 4  . ranitidine (ZANTAC) 300 MG tablet TAKE 1 TABLET (300 MG TOTAL) BY MOUTH AT BEDTIME. 30 tablet 11   No facility-administered medications prior to visit.     Allergies:  Allergies  Allergen Reactions  . Iodine     Topical antiseptic-as a young adult. Don't know reaction      Review of Systems: See HPI for pertinent ROS. All other  ROS negative.    Physical Exam: Blood pressure 126/78, pulse 72, temperature 97.9 F (36.6 C), temperature source Oral, resp. rate 16, weight 232 lb 3.2 oz (105.3 kg), SpO2 98 %., Body mass index is 31.93 kg/m. General:  WNWD WM. Appears in no acute distress. HEENT: Normocephalic, atraumatic, eyes without discharge, sclera non-icteric, nares are without discharge. Bilateral auditory canals clear, TM's are without perforation, pearly grey and translucent with reflective cone of light bilaterally. Oral cavity moist, posterior pharynx without exudate, erythema, peritonsillar abscess. No tenderness with percussion to frontal or maxillary sinuses bilaterally.  Neck: Supple. No thyromegaly. No lymphadenopathy. Lungs: Clear bilaterally to auscultation without wheezes, rales, or rhonchi. Breathing is unlabored. Heart: Regular rhythm. No murmurs, rubs, or gallops. Msk:  Strength and tone normal for age. Extremities/Skin: Warm and dry.  Neuro: Alert and oriented X 3. Moves all extremities spontaneously. Gait is normal. CNII-XII grossly in tact. Psych:  Responds to questions appropriately with a normal affect.     ASSESSMENT AND PLAN:  66 y.o. year old male with  1. Bacterial respiratory infection Is to take antibiotic as directed. Can continue over-the-counter medications in the interim for symptom relief. Follow-up if symptoms do not resolve within 1 week after completion of antibiotic. -  azithromycin (ZITHROMAX) 250 MG tablet; Day 1: Take 2 daily. Days 2-5: Take 1 daily.  Dispense: 6 tablet; Refill: 0   Signed, [address] [place], [place], [facility] 03/11/2016 12:31 PM

## 2016-04-25 ENCOUNTER — Other Ambulatory Visit: Payer: Self-pay | Admitting: Family Medicine

## 2016-04-25 ENCOUNTER — Other Ambulatory Visit: Payer: 59

## 2016-04-25 DIAGNOSIS — Z79899 Other long term (current) drug therapy: Secondary | ICD-10-CM

## 2016-04-25 DIAGNOSIS — E785 Hyperlipidemia, unspecified: Secondary | ICD-10-CM

## 2016-04-25 DIAGNOSIS — Z125 Encounter for screening for malignant neoplasm of prostate: Secondary | ICD-10-CM

## 2016-04-25 DIAGNOSIS — I10 Essential (primary) hypertension: Secondary | ICD-10-CM

## 2016-04-25 DIAGNOSIS — Z0000 Encounter for general adult medical examination without abnormal findings: Secondary | ICD-10-CM

## 2016-04-25 LAB — CBC WITH DIFFERENTIAL/PLATELET
Basophils Absolute: 63 cells/uL (ref 0–200)
Basophils Relative: 1 %
Eosinophils Absolute: 189 cells/uL (ref 15–500)
Eosinophils Relative: 3 %
HEMATOCRIT: 45.7 % (ref 38.5–50.0)
Hemoglobin: 15.3 g/dL (ref 13.0–17.0)
LYMPHS PCT: 29 %
Lymphs Abs: 1827 cells/uL (ref 850–3900)
MCH: 31.4 pg (ref 27.0–33.0)
MCHC: 33.5 g/dL (ref 32.0–36.0)
MCV: 93.6 fL (ref 80.0–100.0)
MONO ABS: 504 {cells}/uL (ref 200–950)
MONOS PCT: 8 %
MPV: 9.7 fL (ref 7.5–12.5)
NEUTROS PCT: 59 %
Neutro Abs: 3717 cells/uL (ref 1500–7800)
Platelets: 247 10*3/uL (ref 140–400)
RBC: 4.88 MIL/uL (ref 4.20–5.80)
RDW: 13.6 % (ref 11.0–15.0)
WBC: 6.3 10*3/uL (ref 3.8–10.8)

## 2016-04-25 LAB — COMPLETE METABOLIC PANEL WITH GFR
ALT: 21 U/L (ref 9–46)
AST: 23 U/L (ref 10–35)
Albumin: 4 g/dL (ref 3.6–5.1)
Alkaline Phosphatase: 57 U/L (ref 40–115)
BUN: 16 mg/dL (ref 7–25)
CHLORIDE: 107 mmol/L (ref 98–110)
CO2: 25 mmol/L (ref 20–31)
Calcium: 9 mg/dL (ref 8.6–10.3)
Creat: 0.93 mg/dL (ref 0.70–1.25)
GFR, Est African American: >89 mL/min (ref >=60)
GFR, Est Non African American: 86 mL/min (ref >=60)
GLUCOSE: 93 mg/dL (ref 70–99)
POTASSIUM: 3.7 mmol/L (ref 3.5–5.3)
SODIUM: 142 mmol/L (ref 135–146)
Total Bilirubin: 0.7 mg/dL (ref 0.2–1.2)
Total Protein: 6.5 g/dL (ref 6.1–8.1)

## 2016-04-25 LAB — TSH: TSH: 2.73 m[IU]/L (ref 0.40–4.50)

## 2016-04-25 LAB — LIPID PANEL
CHOL/HDL RATIO: 3.6 ratio (ref <5.0)
Cholesterol: 170 mg/dL (ref <200)
HDL: 47 mg/dL (ref >40)
LDL Cholesterol: 97 mg/dL (ref <100)
Triglycerides: 131 mg/dL (ref <150)
VLDL: 26 mg/dL (ref <30)

## 2016-04-25 LAB — PSA: PSA: 0.5 ng/mL (ref <=4.0)

## 2016-04-29 ENCOUNTER — Encounter: Payer: Self-pay | Admitting: Family Medicine

## 2016-04-29 ENCOUNTER — Ambulatory Visit (INDEPENDENT_AMBULATORY_CARE_PROVIDER_SITE_OTHER): Payer: 59 | Admitting: Family Medicine

## 2016-04-29 VITALS — BP 118/80 | HR 80 | Temp 98.2°F | Resp 14 | Ht 71.5 in | Wt 233.0 lb

## 2016-04-29 DIAGNOSIS — K219 Gastro-esophageal reflux disease without esophagitis: Secondary | ICD-10-CM

## 2016-04-29 DIAGNOSIS — Z0000 Encounter for general adult medical examination without abnormal findings: Secondary | ICD-10-CM | POA: Diagnosis not present

## 2016-04-29 DIAGNOSIS — I10 Essential (primary) hypertension: Secondary | ICD-10-CM

## 2016-04-29 DIAGNOSIS — E7800 Pure hypercholesterolemia, unspecified: Secondary | ICD-10-CM

## 2016-04-29 NOTE — Progress Notes (Signed)
Subjective:    Patient ID: [name], male    DOB: 03-Dec-1950, 66 y.o.   MRN: [id number]  HPI  Patient is here today for complete physical exam. He has no specific concerns. His last colonoscopy was in 2014 and was significant for 2 tubular adenomas. He is due for repeat colonoscopy in 2019. Overall the patient is doing well. He does report some nocturia and weak stream. This is been gradual and is not severe. However the patient just wanted it noted in his chart. He denies any dysuria or hematuria. He denies any chest pain shortness of breath or dyspnea on exertion. He denies any cough or hemoptysis. He denies any abdominal pain, melena, hematochezia. Appointment on 04/25/2016  Component Date Value Ref Range Status  . Sodium 04/25/2016 142  135 - 146 mmol/L Final  . Potassium 04/25/2016 3.7  3.5 - 5.3 mmol/L Final  . Chloride 04/25/2016 107  98 - 110 mmol/L Final  . CO2 04/25/2016 25  20 - 31 mmol/L Final  . Glucose, Bld 04/25/2016 93  70 - 99 mg/dL Final  . BUN 04/25/2016 16  7 - 25 mg/dL Final  . Creat 04/25/2016 0.93  0.70 - 1.25 mg/dL Final   Comment:   For patients > or = 64 years of age: The upper reference limit for Creatinine is approximately 13% higher for people identified as African-American.     . Total Bilirubin 04/25/2016 0.7  0.2 - 1.2 mg/dL Final  . Alkaline Phosphatase 04/25/2016 57  40 - 115 U/L Final  . AST 04/25/2016 23  10 - 35 U/L Final  . ALT 04/25/2016 21  9 - 46 U/L Final  . Total Protein 04/25/2016 6.5  6.1 - 8.1 g/dL Final  . Albumin 04/25/2016 4.0  3.6 - 5.1 g/dL Final  . Calcium 04/25/2016 9.0  8.6 - 10.3 mg/dL Final  . GFR, Est African American 04/25/2016 >89  >=60 mL/min Final  . GFR, Est Non African American 04/25/2016 86  >=60 mL/min Final  . TSH 04/25/2016 2.73  0.40 - 4.50 mIU/L Final  . Cholesterol 04/25/2016 170  <200 mg/dL Final  . Triglycerides 04/25/2016 131  <150 mg/dL Final  . HDL 04/25/2016 47  >40 mg/dL Final  . Total CHOL/HDL  Ratio 04/25/2016 3.6  <5.0 Ratio Final  . VLDL 04/25/2016 26  <30 mg/dL Final  . LDL Cholesterol 04/25/2016 97  <100 mg/dL Final  . WBC 04/25/2016 6.3  3.8 - 10.8 K/uL Final  . RBC 04/25/2016 4.88  4.20 - 5.80 MIL/uL Final  . Hemoglobin 04/25/2016 15.3  13.0 - 17.0 g/dL Final  . HCT 04/25/2016 45.7  38.5 - 50.0 % Final  . MCV 04/25/2016 93.6  80.0 - 100.0 fL Final  . MCH 04/25/2016 31.4  27.0 - 33.0 pg Final  . MCHC 04/25/2016 33.5  32.0 - 36.0 g/dL Final  . RDW 04/25/2016 13.6  11.0 - 15.0 % Final  . Platelets 04/25/2016 247  140 - 400 K/uL Final  . MPV 04/25/2016 9.7  7.5 - 12.5 fL Final  . Neutro Abs 04/25/2016 3717  1,500 - 7,800 cells/uL Final  . Lymphs Abs 04/25/2016 1827  850 - 3,900 cells/uL Final  . Monocytes Absolute 04/25/2016 504  200 - 950 cells/uL Final  . Eosinophils Absolute 04/25/2016 189  15 - 500 cells/uL Final  . Basophils Absolute 04/25/2016 63  0 - 200 cells/uL Final  . Neutrophils Relative % 04/25/2016 59  % Final  . Lymphocytes Relative  04/25/2016 29  % Final  . Monocytes Relative 04/25/2016 8  % Final  . Eosinophils Relative 04/25/2016 3  % Final  . Basophils Relative 04/25/2016 1  % Final  . Smear Review 04/25/2016 Criteria for review not met   Final  . PSA 04/25/2016 0.5  <=4.0 ng/mL Final   Comment:   The total PSA value from this assay system is standardized against the WHO standard. The test result will be approximately 20% lower when compared to the equimolar-standardized total PSA ([employer] Coul[employer]). Comparison of serial PSA results should be interpreted with this fact in mind.   This test was performed using the Siemens chemiluminescent method. Values obtained from different assay methods cannot be used interchangeably. PSA levels, regardless of value, should not be interpreted as absolute evidence of the presence or absence of disease.      Past Medical History:  Diagnosis Date  . Allergy    wood sap  . Allergy history, seafood    oysters,  mussels  . Arthritis    neck  . Bee sting allergy 1980   hives after 20 yellowjacket stings  . Cancer ([place])    "skin cancer frozen off"  . Colon polyps   . GERD (gastroesophageal reflux disease)   . Hypercholesteremia   . Hypertension    Past Surgical History:  Procedure Laterality Date  . CARPAL TUNNEL RELEASE Right 1985   elbow  . SHOULDER ARTHROSCOPY WITH SUBACROMIAL DECOMPRESSION AND OPEN ROTATOR C Right 10/22/2012   Procedure: RIGHT SHOULDER ARTHROSCOPY WITH SUBACROMIAL DECOMPRESSION AND  ROTATOR CUFF REPAIR,  CORACOACROMIAL LIGAMENT RELEASE AND  MANIPULATION UNDER ANESTHESIA;  Surgeon: Johnn Hai, MD;  Location: [facility];  Service: Orthopedics;  Laterality: Right;   Current Outpatient Prescriptions on File Prior to Visit  Medication Sig Dispense Refill  . amLODipine (NORVASC) 10 MG tablet Take 1 tablet (10 mg total) by mouth daily. 90 tablet 3  . cyclobenzaprine (FLEXERIL) 10 MG tablet Take 1 tablet (10 mg total) by mouth 3 (three) times daily as needed for muscle spasms. 30 tablet 0  . doxazosin (CARDURA) 4 MG tablet Take 1 tablet (4 mg total) by mouth daily. 90 tablet 4  . hydrochlorothiazide (HYDRODIURIL) 25 MG tablet Take 1 tablet (25 mg total) by mouth daily. 90 tablet 4  . levocetirizine (XYZAL) 5 MG tablet Take 1 tablet (5 mg total) by mouth every evening. 90 tablet 4  . losartan (COZAAR) 100 MG tablet Take 1 tablet (100 mg total) by mouth every morning. 90 tablet 4  . meloxicam (MOBIC) 15 MG tablet Take 1 tablet (15 mg total) by mouth daily. 30 tablet 0  . pantoprazole (PROTONIX) 40 MG tablet Take 1 tablet (40 mg total) by mouth daily. 90 tablet 4  . pravastatin (PRAVACHOL) 40 MG tablet Take 1 tablet (40 mg total) by mouth at bedtime. 90 tablet 4  . ranitidine (ZANTAC) 300 MG tablet TAKE 1 TABLET (300 MG TOTAL) BY MOUTH AT BEDTIME. 30 tablet 11   No current facility-administered medications on file prior to visit.    Allergies  Allergen Reactions  . Iodine      Topical antiseptic-as a young adult. Don't know reaction   Social History   Social History  . Marital status: Married    Spouse name: N/A  . Number of children: N/A  . Years of education: N/A   Occupational History  . Not on file.   Social History Main Topics  . Smoking status: Former Smoker  Packs/day: 1.00    Years: 15.00    Types: Cigarettes    Quit date: 03/04/1984  . Smokeless tobacco: Former Systems developer    Types: Chew    Quit date: 03/04/2008  . Alcohol use No  . Drug use: No  . Sexual activity: Not on file   Other Topics Concern  . Not on file   Social History Narrative  . No narrative on file   Family History  Problem Relation Age of Onset  . Heart failure Mother   . Heart failure Father      Review of Systems  All other systems reviewed and are negative.      Objective:   Physical Exam  Constitutional: He is oriented to person, place, and time. He appears well-developed and well-nourished. No distress.  HENT:  Head: Normocephalic and atraumatic.  Right Ear: External ear normal.  Left Ear: External ear normal.  Nose: Nose normal.  Mouth/Throat: Oropharynx is clear and moist. No oropharyngeal exudate.  Eyes: Conjunctivae and EOM are normal. Pupils are equal, round, and reactive to light. Right eye exhibits no discharge. Left eye exhibits no discharge. No scleral icterus.  Neck: Normal range of motion. Neck supple. No JVD present. No tracheal deviation present. No thyromegaly present.  Cardiovascular: Normal rate, regular rhythm, normal heart sounds and intact distal pulses.  Exam reveals no gallop and no friction rub.   No murmur heard. Pulmonary/Chest: Effort normal and breath sounds normal. No stridor. No respiratory distress. He has no wheezes. He has no rales. He exhibits no tenderness.  Abdominal: Soft. Bowel sounds are normal. He exhibits no distension and no mass. There is no tenderness. There is no rebound and no guarding.  Musculoskeletal: Normal  range of motion. He exhibits no edema or tenderness.  Lymphadenopathy:    He has no cervical adenopathy.  Neurological: He is alert and oriented to person, place, and time. He has normal reflexes. No cranial nerve deficit. He exhibits normal muscle tone. Coordination normal.  Skin: Skin is warm. No rash noted. He is not diaphoretic. No erythema. No pallor.  Psychiatric: He has a normal mood and affect. His behavior is normal. Judgment and thought content normal.  Vitals reviewed.         Assessment & Plan:  Routine general medical examination at a health care facility  Benign essential HTN  Pure hypercholesterolemia  Gastroesophageal reflux disease without esophagitis  The patient's lab work is excellent. His PSA is down almost undetectable levels of 0.5. His colonoscopy is up-to-date. His immunizations are up-to-date. His blood pressure is extremely well controlled. Cholesterol is acceptable. Otherwise his symptoms are well controlled. I'll make no changes in medication at this time. Answers screening is up-to-date.

## 2016-04-30 ENCOUNTER — Other Ambulatory Visit: Payer: Self-pay | Admitting: Family Medicine

## 2016-04-30 DIAGNOSIS — I10 Essential (primary) hypertension: Secondary | ICD-10-CM

## 2016-06-24 ENCOUNTER — Ambulatory Visit (INDEPENDENT_AMBULATORY_CARE_PROVIDER_SITE_OTHER): Payer: 59 | Admitting: Family Medicine

## 2016-06-24 ENCOUNTER — Encounter: Payer: Self-pay | Admitting: Family Medicine

## 2016-06-24 ENCOUNTER — Ambulatory Visit
Admission: RE | Admit: 2016-06-24 | Discharge: 2016-06-24 | Disposition: A | Discharge status: Home / Self Care | Payer: 59 | Source: Ambulatory Visit | Attending: Family Medicine | Admitting: Family Medicine

## 2016-06-24 VITALS — BP 130/70 | HR 84 | Temp 97.9°F | Resp 18 | Ht 71.5 in | Wt 232.0 lb

## 2016-06-24 DIAGNOSIS — L239 Allergic contact dermatitis, unspecified cause: Secondary | ICD-10-CM | POA: Diagnosis not present

## 2016-06-24 DIAGNOSIS — M542 Cervicalgia: Secondary | ICD-10-CM

## 2016-06-24 MED ORDER — MOMETASONE FUROATE 0.1 % EX CREA: 1.0000 "application " | TOPICAL_CREAM | Freq: Every day | CUTANEOUS | 0 refills | Status: DC

## 2016-06-24 NOTE — Progress Notes (Signed)
Subjective:    Patient ID: [name], male    DOB: 06-21-1950, 66 y.o.   MRN: [id number]  HPI Patient presents today Reporting pain and numbness that radiates from both sides of his neck down into both shoulders and into his arms. This is occurred 4 times over the last few weeks. In each instance, it was triggered by raising his arms above his shoulders. The pain would improve with time.  He does report steady constant pain and stiffness in his neck on a daily basis.  He became more concerned with the neuropathis symptoms occurring in his arms.   He also has a rash on his right lateral shin.  Rash consists of erythematous bumps (1-2 mm and scrathes and scaly skin.  Localized to a 5 x 5 cm patch on the lateral shin for 1 week.   Past Medical History:  Diagnosis Date  . Allergy    wood sap  . Allergy history, seafood    oysters, mussels  . Arthritis    neck  . Bee sting allergy 1980   hives after 20 yellowjacket stings  . Cancer ([place])    "skin cancer frozen off"  . Colon polyps   . GERD (gastroesophageal reflux disease)   . Hypercholesteremia   . Hypertension    Past Surgical History:  Procedure Laterality Date  . CARPAL TUNNEL RELEASE Right 1985   elbow  . SHOULDER ARTHROSCOPY WITH SUBACROMIAL DECOMPRESSION AND OPEN ROTATOR C Right 10/22/2012   Procedure: RIGHT SHOULDER ARTHROSCOPY WITH SUBACROMIAL DECOMPRESSION AND  ROTATOR CUFF REPAIR,  CORACOACROMIAL LIGAMENT RELEASE AND  MANIPULATION UNDER ANESTHESIA;  Surgeon: Johnn Hai, MD;  Location: [facility];  Service: Orthopedics;  Laterality: Right;   Current Outpatient Prescriptions on File Prior to Visit  Medication Sig Dispense Refill  . amLODipine (NORVASC) 10 MG tablet TAKE 1 TABLET BY MOUTH  DAILY 90 tablet 3  . doxazosin (CARDURA) 4 MG tablet TAKE 1 TABLET BY MOUTH  DAILY 90 tablet 3  . hydrochlorothiazide (HYDRODIURIL) 25 MG tablet TAKE 1 TABLET BY MOUTH  DAILY 90 tablet 3  . levocetirizine (XYZAL) 5 MG tablet TAKE 1  TABLET BY MOUTH  EVERY EVENING 90 tablet 3  . losartan (COZAAR) 100 MG tablet TAKE 1 TABLET BY MOUTH  EVERY MORNING 90 tablet 3  . pantoprazole (PROTONIX) 40 MG tablet TAKE 1 TABLET BY MOUTH  DAILY 90 tablet 3  . pravastatin (PRAVACHOL) 40 MG tablet TAKE 1 TABLET BY MOUTH AT  BEDTIME 90 tablet 3  . ranitidine (ZANTAC) 300 MG tablet TAKE 1 TABLET (300 MG TOTAL) BY MOUTH AT BEDTIME. 30 tablet 11   No current facility-administered medications on file prior to visit.    Allergies  Allergen Reactions  . Iodine     Topical antiseptic-as a young adult. Don't know reaction   Social History   Social History  . Marital status: Married    Spouse name: N/A  . Number of children: N/A  . Years of education: N/A   Occupational History  . Not on file.   Social History Main Topics  . Smoking status: Former Smoker    Packs/day: 1.00    Years: 15.00    Types: Cigarettes    Quit date: 03/04/1984  . Smokeless tobacco: Former Systems developer    Types: Chew    Quit date: 03/04/2008  . Alcohol use No  . Drug use: No  . Sexual activity: Not on file   Other Topics Concern  . Not  on file   Social History Narrative  . No narrative on file      Review of Systems  All other systems reviewed and are negative.      Objective:   Physical Exam  Cardiovascular: Normal rate, regular rhythm and normal heart sounds.   Pulmonary/Chest: Effort normal and breath sounds normal.  Musculoskeletal:       Right shoulder: Normal.       Left shoulder: Normal.       Cervical back: He exhibits decreased range of motion, tenderness and pain. He exhibits no bony tenderness and no spasm.  Skin: Rash noted. There is erythema.  Vitals reviewed.         Assessment & Plan:  Neck pain - Plan: DG Cervical Spine Complete  Allergic contact dermatitis, unspecified trigger - Plan: mometasone (ELOCON) 0.1 % cream  Suspect cervical DDD.  Obtain xray of C-spine to evaluate further.  If worsening, get MRI of c-spine vs NCS of  the arms.  Treat contact derm on shin with elocon cream daily for 1 week.

## 2016-08-09 ENCOUNTER — Encounter: Payer: Self-pay | Admitting: Family Medicine

## 2016-08-09 ENCOUNTER — Ambulatory Visit (INDEPENDENT_AMBULATORY_CARE_PROVIDER_SITE_OTHER): Payer: 59 | Admitting: Family Medicine

## 2016-08-09 VITALS — BP 126/68 | HR 80 | Temp 98.3°F | Resp 18 | Ht 71.5 in | Wt 220.0 lb

## 2016-08-09 DIAGNOSIS — M2550 Pain in unspecified joint: Secondary | ICD-10-CM | POA: Diagnosis not present

## 2016-08-09 LAB — CBC WITH DIFFERENTIAL/PLATELET
BASOS PCT: 1 %
Basophils Absolute: 67 cells/uL (ref 0–200)
EOS ABS: 134 {cells}/uL (ref 15–500)
Eosinophils Relative: 2 %
HCT: 45 % (ref 38.5–50.0)
Hemoglobin: 14.9 g/dL (ref 13.0–17.0)
Lymphocytes Relative: 18 %
Lymphs Abs: 1206 cells/uL (ref 850–3900)
MCH: 30.8 pg (ref 27.0–33.0)
MCHC: 33.1 g/dL (ref 32.0–36.0)
MCV: 93 fL (ref 80.0–100.0)
MONOS PCT: 12 %
MPV: 10.2 fL (ref 7.5–12.5)
Monocytes Absolute: 804 cells/uL (ref 200–950)
NEUTROS ABS: 4489 {cells}/uL (ref 1500–7800)
Neutrophils Relative %: 67 %
PLATELETS: 262 10*3/uL (ref 140–400)
RBC: 4.84 MIL/uL (ref 4.20–5.80)
RDW: 13.2 % (ref 11.0–15.0)
WBC: 6.7 10*3/uL (ref 3.8–10.8)

## 2016-08-09 NOTE — Progress Notes (Signed)
Subjective:    Patient ID: [name], male    DOB: 02-25-1951, 66 y.o.   MRN: [id number]  HPI The patient is a pleasant 66 year old Caucasian male who presents with polyarthralgia for several weeks. His concern is that the joint pains and fatigue started only after he is had numerous tick bites over the summer. He estimates that he has been bitten by more than 12 tics. He has never experienced flulike symptoms. He never witnessed erythema migrans. He denies any rash. The joint pains or not severe. However he believes that the joint pains in his neck and shoulders knees and hips are beyond what is normal for his age. He is on a statin for cholesterol. Past Medical History:  Diagnosis Date  . Allergy    wood sap  . Allergy history, seafood    oysters, mussels  . Arthritis    neck  . Bee sting allergy 1980   hives after 20 yellowjacket stings  . Cancer ([place])    "skin cancer frozen off"  . Colon polyps   . GERD (gastroesophageal reflux disease)   . Hypercholesteremia   . Hypertension    Past Surgical History:  Procedure Laterality Date  . CARPAL TUNNEL RELEASE Right 1985   elbow  . SHOULDER ARTHROSCOPY WITH SUBACROMIAL DECOMPRESSION AND OPEN ROTATOR C Right 10/22/2012   Procedure: RIGHT SHOULDER ARTHROSCOPY WITH SUBACROMIAL DECOMPRESSION AND  ROTATOR CUFF REPAIR,  CORACOACROMIAL LIGAMENT RELEASE AND  MANIPULATION UNDER ANESTHESIA;  Surgeon: Johnn Hai, MD;  Location: [facility];  Service: Orthopedics;  Laterality: Right;   Current Outpatient Prescriptions on File Prior to Visit  Medication Sig Dispense Refill  . amLODipine (NORVASC) 10 MG tablet TAKE 1 TABLET BY MOUTH  DAILY 90 tablet 3  . doxazosin (CARDURA) 4 MG tablet TAKE 1 TABLET BY MOUTH  DAILY 90 tablet 3  . hydrochlorothiazide (HYDRODIURIL) 25 MG tablet TAKE 1 TABLET BY MOUTH  DAILY 90 tablet 3  . levocetirizine (XYZAL) 5 MG tablet TAKE 1 TABLET BY MOUTH  EVERY EVENING 90 tablet 3  . losartan (COZAAR) 100 MG tablet  TAKE 1 TABLET BY MOUTH  EVERY MORNING 90 tablet 3  . mometasone (ELOCON) 0.1 % cream Apply 1 application topically daily. 45 g 0  . pantoprazole (PROTONIX) 40 MG tablet TAKE 1 TABLET BY MOUTH  DAILY 90 tablet 3  . pravastatin (PRAVACHOL) 40 MG tablet TAKE 1 TABLET BY MOUTH AT  BEDTIME 90 tablet 3  . ranitidine (ZANTAC) 300 MG tablet TAKE 1 TABLET (300 MG TOTAL) BY MOUTH AT BEDTIME. 30 tablet 11  . zafirlukast (ACCOLATE) 20 MG tablet Take 20 mg by mouth 2 (two) times daily before a meal.     No current facility-administered medications on file prior to visit.    Allergies  Allergen Reactions  . Iodine     Topical antiseptic-as a young adult. Don't know reaction   Social History   Social History  . Marital status: Married    Spouse name: N/A  . Number of children: N/A  . Years of education: N/A   Occupational History  . Not on file.   Social History Main Topics  . Smoking status: Former Smoker    Packs/day: 1.00    Years: 15.00    Types: Cigarettes    Quit date: 03/04/1984  . Smokeless tobacco: Former Systems developer    Types: Chew    Quit date: 03/04/2008  . Alcohol use No  . Drug use: No  . Sexual activity:  Not on file   Other Topics Concern  . Not on file   Social History Narrative  . No narrative on file      Review of Systems  All other systems reviewed and are negative.      Objective:   Physical Exam  Cardiovascular: Normal rate, regular rhythm and normal heart sounds.   Pulmonary/Chest: Effort normal and breath sounds normal.  Musculoskeletal:       Right shoulder: Normal.       Left shoulder: Normal.       Right knee: Normal.       Left knee: Normal.       Cervical back: He exhibits decreased range of motion. He exhibits no tenderness, no bony tenderness, no pain and no spasm.  Skin: No rash noted. No erythema.  Vitals reviewed.         Assessment & Plan:  Polyarthralgia - Plan: CBC with Differential/Platelet, COMPLETE METABOLIC PANEL WITH GFR, B.  burgdorfi antibodies by WB, Sedimentation rate   This is most likely degenerative joint disease/osteoarthritis or possibly statin-induced muscle and joint pain. I recommended discontinuing pravastatin. I will check the patient for Lyme disease. I'll also check a CBC, CMP as well as a sedimentation rate to evaluate for autoimmune illnesses. Will await the results of lab work before determining further treatment decisions. I did recommend temporary discontinuation of pravastatin to see if the joint pains improved

## 2016-08-10 LAB — COMPLETE METABOLIC PANEL WITH GFR
ALT: 38 U/L (ref 9–46)
AST: 33 U/L (ref 10–35)
Albumin: 4.1 g/dL (ref 3.6–5.1)
Alkaline Phosphatase: 60 U/L (ref 40–115)
BILIRUBIN TOTAL: 0.7 mg/dL (ref 0.2–1.2)
BUN: 12 mg/dL (ref 7–25)
CHLORIDE: 104 mmol/L (ref 98–110)
CO2: 23 mmol/L (ref 20–31)
CREATININE: 0.97 mg/dL (ref 0.70–1.25)
Calcium: 9.1 mg/dL (ref 8.6–10.3)
GFR, Est Non African American: 82 mL/min (ref >=60)
GLUCOSE: 99 mg/dL (ref 70–99)
Potassium: 3.5 mmol/L (ref 3.5–5.3)
SODIUM: 142 mmol/L (ref 135–146)
TOTAL PROTEIN: 6.2 g/dL (ref 6.1–8.1)

## 2016-08-10 LAB — SEDIMENTATION RATE: Sed Rate: 4 mm/hr (ref 0–20)

## 2016-08-13 LAB — LYME ABY, WSTRN BLT IGG & IGM W/BANDS
B BURGDORFERI IGM ABS (IB): NEGATIVE
B burgdorferi IgG Abs (IB): NEGATIVE
LYME DISEASE 18 KD IGG: NONREACTIVE
LYME DISEASE 23 KD IGG: NONREACTIVE
LYME DISEASE 23 KD IGM: NONREACTIVE
LYME DISEASE 39 KD IGG: NONREACTIVE
LYME DISEASE 39 KD IGM: NONREACTIVE
LYME DISEASE 41 KD IGM: NONREACTIVE
LYME DISEASE 58 KD IGG: NONREACTIVE
Lyme Disease 28 kD IgG: NONREACTIVE
Lyme Disease 30 kD IgG: NONREACTIVE
Lyme Disease 41 kD IgG: REACTIVE — AB
Lyme Disease 45 kD IgG: NONREACTIVE
Lyme Disease 66 kD IgG: NONREACTIVE
Lyme Disease 93 kD IgG: NONREACTIVE

## 2016-10-02 ENCOUNTER — Encounter: Payer: Self-pay | Admitting: Physician Assistant

## 2016-10-02 ENCOUNTER — Ambulatory Visit (INDEPENDENT_AMBULATORY_CARE_PROVIDER_SITE_OTHER): Payer: 59 | Admitting: Physician Assistant

## 2016-10-02 VITALS — BP 130/76 | HR 73 | Temp 98.0°F | Resp 16 | Wt 226.2 lb

## 2016-10-02 DIAGNOSIS — L239 Allergic contact dermatitis, unspecified cause: Secondary | ICD-10-CM

## 2016-10-02 MED ORDER — PREDNISONE 20 MG PO TABS: ORAL_TABLET | ORAL | 0 refills | Status: DC

## 2016-10-02 NOTE — Progress Notes (Signed)
Patient ID: [name] MRN: [id number], DOB: Aug 12, 1950, 66 y.o. Date of Encounter: 10/02/2016, 4:53 PM    Chief Complaint:  Chief Complaint  Patient presents with  . Urticaria    on face      HPI: 66 y.o. year old male reports that he developed this rash on his face yesterday.  He states that he does not drink coffee. Says that he does drink diet sodas. Visit he discovered he had this problem about a year ago. That same type of rash at that time and felt that it was secondary to drinking a lot of caffeine in the diet sodas. Stopped the diet sodas and did not have any recurrent problems. Then was able to drink 1 or 2 diet sodas per day and have no problems. However recently he gradually over time had increased his intake of diet soda and just didn't even think about it and realize how much she was consuming. Yesterday developed this itchy urticarial rash on his face. Feels that it is secondary to the increased caffeine intake/increased diet soda intake. Has cut those back again and has stopped drinking them since the rash occurred yesterday but feels that he is needs medicine to get rid of this itching and rash.  No other concerns to address. Has had no itchy throat no tightness in the throat no difficulty breathing.   Home Meds:   Outpatient Medications Prior to Visit  Medication Sig Dispense Refill  . amLODipine (NORVASC) 10 MG tablet TAKE 1 TABLET BY MOUTH  DAILY 90 tablet 3  . doxazosin (CARDURA) 4 MG tablet TAKE 1 TABLET BY MOUTH  DAILY 90 tablet 3  . hydrochlorothiazide (HYDRODIURIL) 25 MG tablet TAKE 1 TABLET BY MOUTH  DAILY 90 tablet 3  . levocetirizine (XYZAL) 5 MG tablet TAKE 1 TABLET BY MOUTH  EVERY EVENING 90 tablet 3  . losartan (COZAAR) 100 MG tablet TAKE 1 TABLET BY MOUTH  EVERY MORNING 90 tablet 3  . mometasone (ELOCON) 0.1 % cream Apply 1 application topically daily. (Patient taking differently: Apply 1 application topically daily. ) 45 g 0  . pantoprazole  (PROTONIX) 40 MG tablet TAKE 1 TABLET BY MOUTH  DAILY 90 tablet 3  . pravastatin (PRAVACHOL) 40 MG tablet TAKE 1 TABLET BY MOUTH AT  BEDTIME 90 tablet 3  . ranitidine (ZANTAC) 300 MG tablet TAKE 1 TABLET (300 MG TOTAL) BY MOUTH AT BEDTIME. (Patient taking differently: TAKE 1 TABLET (300 MG TOTAL) BY MOUTH AT BEDTIME. as needed) 30 tablet 11  . zafirlukast (ACCOLATE) 20 MG tablet Take 20 mg by mouth 2 (two) times daily before a meal. As needed     No facility-administered medications prior to visit.     Allergies:  Allergies  Allergen Reactions  . Caffeine Hives  . Iodine     Topical antiseptic-as a young adult. Don't know reaction      Review of Systems: See HPI for pertinent ROS. All other ROS negative.    Physical Exam: Blood pressure 130/76, pulse 73, temperature 98 F (36.7 C), temperature source Oral, resp. rate 16, weight 226 lb 3.2 oz (102.6 kg), SpO2 97 %., Body mass index is 31.11 kg/m. General:  WNWD WM. Appears in no acute distress. Neck: Supple. No thyromegaly. No lymphadenopathy. Lungs: Clear bilaterally to auscultation without wheezes, rales, or rhonchi. Breathing is unlabored. Heart: Regular rhythm. No murmurs, rubs, or gallops. Msk:  Strength and tone normal for age. Skin: Face: He has area of  Mild  urticaria at mid lower forehead. He has area of mild aortic area at his chin. Neuro: Alert and oriented X 3. Moves all extremities spontaneously. Gait is normal. CNII-XII grossly in tact. Psych:  Responds to questions appropriately with a normal affect.     ASSESSMENT AND PLAN:  66 y.o. year old male with  1. Allergic dermatitis He is to take the prednisone taper as directed. Cautioned this may cause some jitteriness or insomnia. He can also take oral Benadryl. Follow up if symptoms worsen or if they do not resolve upon completion of the prednisone taper. - predniSONE (DELTASONE) 20 MG tablet; Take 3 daily for 2 days, then 2 daily for 2 days, then 1 daily for 2 days.   Dispense: 12 tablet; Refill: 0   Signed, [address] [place], [place], [facility] 10/02/2016 4:53 PM

## 2016-12-11 ENCOUNTER — Ambulatory Visit (INDEPENDENT_AMBULATORY_CARE_PROVIDER_SITE_OTHER): Payer: 59

## 2016-12-11 DIAGNOSIS — Z23 Encounter for immunization: Secondary | ICD-10-CM | POA: Diagnosis not present

## 2016-12-11 NOTE — Progress Notes (Signed)
patient was seen in office for flu vaccine. Patient received vaccine in right deltoid. Patient tolerated well

## 2016-12-12 ENCOUNTER — Other Ambulatory Visit: Payer: Self-pay

## 2016-12-12 NOTE — Telephone Encounter (Signed)
Last OV 8/1 Ok to refill?

## 2016-12-12 NOTE — Telephone Encounter (Signed)
Ok x 5 

## 2016-12-16 MED ORDER — ZAFIRLUKAST 20 MG PO TABS: 20.0000 mg | ORAL_TABLET | Freq: Two times a day (BID) | ORAL | 5 refills | Status: DC

## 2016-12-16 NOTE — Telephone Encounter (Signed)
rx called into pharmacy

## 2017-04-30 ENCOUNTER — Other Ambulatory Visit: Payer: Self-pay | Admitting: Family Medicine

## 2017-04-30 DIAGNOSIS — I10 Essential (primary) hypertension: Secondary | ICD-10-CM

## 2017-04-30 DIAGNOSIS — Z0000 Encounter for general adult medical examination without abnormal findings: Secondary | ICD-10-CM

## 2017-04-30 DIAGNOSIS — Z114 Encounter for screening for human immunodeficiency virus [HIV]: Secondary | ICD-10-CM

## 2017-04-30 DIAGNOSIS — E7800 Pure hypercholesterolemia, unspecified: Secondary | ICD-10-CM

## 2017-04-30 DIAGNOSIS — Z125 Encounter for screening for malignant neoplasm of prostate: Secondary | ICD-10-CM

## 2017-04-30 DIAGNOSIS — Z1159 Encounter for screening for other viral diseases: Secondary | ICD-10-CM

## 2017-05-05 ENCOUNTER — Other Ambulatory Visit: Payer: 59

## 2017-05-09 ENCOUNTER — Encounter: Payer: 59 | Admitting: Family Medicine

## 2017-05-12 ENCOUNTER — Other Ambulatory Visit: Payer: Medicare Other

## 2017-05-12 DIAGNOSIS — Z1159 Encounter for screening for other viral diseases: Secondary | ICD-10-CM | POA: Diagnosis not present

## 2017-05-12 DIAGNOSIS — Z0000 Encounter for general adult medical examination without abnormal findings: Secondary | ICD-10-CM | POA: Diagnosis not present

## 2017-05-12 DIAGNOSIS — Z114 Encounter for screening for human immunodeficiency virus [HIV]: Secondary | ICD-10-CM

## 2017-05-12 DIAGNOSIS — I10 Essential (primary) hypertension: Secondary | ICD-10-CM

## 2017-05-12 DIAGNOSIS — Z125 Encounter for screening for malignant neoplasm of prostate: Secondary | ICD-10-CM | POA: Diagnosis not present

## 2017-05-12 DIAGNOSIS — E7800 Pure hypercholesterolemia, unspecified: Secondary | ICD-10-CM | POA: Diagnosis not present

## 2017-05-13 LAB — CBC WITH DIFFERENTIAL/PLATELET
Basophils Absolute: 48 cells/uL (ref 0–200)
Basophils Relative: 0.7 %
EOS ABS: 117 {cells}/uL (ref 15–500)
Eosinophils Relative: 1.7 %
HCT: 41 % (ref 38.5–50.0)
HEMOGLOBIN: 14.6 g/dL (ref 13.2–17.1)
LYMPHS ABS: 1283 {cells}/uL (ref 850–3900)
MCH: 33.1 pg — AB (ref 27.0–33.0)
MCHC: 35.6 g/dL (ref 32.0–36.0)
MCV: 93 fL (ref 80.0–100.0)
MPV: 10.4 fL (ref 7.5–12.5)
Monocytes Relative: 7.9 %
NEUTROS ABS: 4906 {cells}/uL (ref 1500–7800)
Neutrophils Relative %: 71.1 %
PLATELETS: 279 10*3/uL (ref 140–400)
RBC: 4.41 10*6/uL (ref 4.20–5.80)
RDW: 12.3 % (ref 11.0–15.0)
Total Lymphocyte: 18.6 %
WBC: 6.9 10*3/uL (ref 3.8–10.8)
WBCMIX: 545 {cells}/uL (ref 200–950)

## 2017-05-13 LAB — HEPATITIS C ANTIBODY
HEP C AB: NONREACTIVE
SIGNAL TO CUT-OFF: 0.02 (ref <1.00)

## 2017-05-13 LAB — COMPREHENSIVE METABOLIC PANEL
AG RATIO: 1.8 (calc) (ref 1.0–2.5)
ALBUMIN MSPROF: 3.8 g/dL (ref 3.6–5.1)
ALKALINE PHOSPHATASE (APISO): 58 U/L (ref 40–115)
ALT: 19 U/L (ref 9–46)
AST: 17 U/L (ref 10–35)
BUN: 15 mg/dL (ref 7–25)
CO2: 28 mmol/L (ref 20–32)
CREATININE: 0.85 mg/dL (ref 0.70–1.25)
Calcium: 9.1 mg/dL (ref 8.6–10.3)
Chloride: 106 mmol/L (ref 98–110)
GLOBULIN: 2.1 g/dL (ref 1.9–3.7)
Glucose, Bld: 101 mg/dL — ABNORMAL HIGH (ref 65–99)
POTASSIUM: 3.6 mmol/L (ref 3.5–5.3)
Sodium: 144 mmol/L (ref 135–146)
Total Bilirubin: 0.6 mg/dL (ref 0.2–1.2)
Total Protein: 5.9 g/dL — ABNORMAL LOW (ref 6.1–8.1)

## 2017-05-13 LAB — PSA: PSA: 0.6 ng/mL (ref <=4.0)

## 2017-05-13 LAB — LIPID PANEL
CHOL/HDL RATIO: 4.3 (calc) (ref <5.0)
CHOLESTEROL: 183 mg/dL (ref <200)
HDL: 43 mg/dL (ref >40)
LDL Cholesterol (Calc): 126 mg/dL (calc) — ABNORMAL HIGH
NON-HDL CHOLESTEROL (CALC): 140 mg/dL — AB (ref <130)
Triglycerides: 46 mg/dL (ref <150)

## 2017-05-13 LAB — HIV ANTIBODY (ROUTINE TESTING W REFLEX): HIV: NONREACTIVE

## 2017-05-16 ENCOUNTER — Encounter: Payer: Self-pay | Admitting: Family Medicine

## 2017-05-16 ENCOUNTER — Ambulatory Visit (INDEPENDENT_AMBULATORY_CARE_PROVIDER_SITE_OTHER): Payer: Medicare Other | Admitting: Family Medicine

## 2017-05-16 VITALS — BP 132/66 | HR 68 | Temp 97.9°F | Resp 16 | Ht 71.5 in | Wt 183.0 lb

## 2017-05-16 DIAGNOSIS — I10 Essential (primary) hypertension: Secondary | ICD-10-CM

## 2017-05-16 DIAGNOSIS — Z0000 Encounter for general adult medical examination without abnormal findings: Secondary | ICD-10-CM

## 2017-05-16 DIAGNOSIS — E7800 Pure hypercholesterolemia, unspecified: Secondary | ICD-10-CM | POA: Diagnosis not present

## 2017-05-16 NOTE — Progress Notes (Signed)
Subjective:    Patient ID: [name], male    DOB: Mar 15, 1950, 67 y.o.   MRN: [id number]  HPI  Patient is here today for complete physical exam. His last colonoscopy was in 2014.  He is due for prostate cancer screening today.  He is due for HIV and hepatitis C screening which was performed and his lab work.  Immunization records are included below Immunization History  Administered Date(s) Administered  . Influenza Split 12/16/2012  . Influenza,inj,Quad PF,6+ Mos 12/27/2015, 12/11/2016  . Influenza-Unspecified 01/02/2014  . Tdap 01/25/2011  . Zoster 01/03/2014    Appointment on 05/12/2017  Component Date Value Ref Range Status  . WBC 05/12/2017 6.9  3.8 - 10.8 Thousand/uL Final  . RBC 05/12/2017 4.41  4.20 - 5.80 Million/uL Final  . Hemoglobin 05/12/2017 14.6  13.2 - 17.1 g/dL Final  . HCT 05/12/2017 41.0  38.5 - 50.0 % Final  . MCV 05/12/2017 93.0  80.0 - 100.0 fL Final  . MCH 05/12/2017 33.1* 27.0 - 33.0 pg Final  . MCHC 05/12/2017 35.6  32.0 - 36.0 g/dL Final  . RDW 05/12/2017 12.3  11.0 - 15.0 % Final  . Platelets 05/12/2017 279  140 - 400 Thousand/uL Final  . MPV 05/12/2017 10.4  7.5 - 12.5 fL Final  . Neutro Abs 05/12/2017 4,906  1,500 - 7,800 cells/uL Final  . Lymphs Abs 05/12/2017 1,283  850 - 3,900 cells/uL Final  . WBC mixed population 05/12/2017 545  200 - 950 cells/uL Final  . Eosinophils Absolute 05/12/2017 117  15 - 500 cells/uL Final  . Basophils Absolute 05/12/2017 48  0 - 200 cells/uL Final  . Neutrophils Relative % 05/12/2017 71.1  % Final  . Total Lymphocyte 05/12/2017 18.6  % Final  . Monocytes Relative 05/12/2017 7.9  % Final  . Eosinophils Relative 05/12/2017 1.7  % Final  . Basophils Relative 05/12/2017 0.7  % Final  . Glucose, Bld 05/12/2017 101* 65 - 99 mg/dL Final   Comment: .            Fasting reference interval . For someone without known diabetes, a glucose value between 100 and 125 mg/dL is consistent with prediabetes and should  be confirmed with a follow-up test. .   . BUN 05/12/2017 15  7 - 25 mg/dL Final  . Creat 05/12/2017 0.85  0.70 - 1.25 mg/dL Final   Comment: For patients >29 years of age, the reference limit for Creatinine is approximately 13% higher for people identified as African-American. .   Havery Moros Ratio 37/85/8850 NOT APPLICABLE  6 - 22 (calc) Final  . Sodium 05/12/2017 144  135 - 146 mmol/L Final  . Potassium 05/12/2017 3.6  3.5 - 5.3 mmol/L Final  . Chloride 05/12/2017 106  98 - 110 mmol/L Final  . CO2 05/12/2017 28  20 - 32 mmol/L Final  . Calcium 05/12/2017 9.1  8.6 - 10.3 mg/dL Final  . Total Protein 05/12/2017 5.9* 6.1 - 8.1 g/dL Final  . Albumin 05/12/2017 3.8  3.6 - 5.1 g/dL Final  . Globulin 05/12/2017 2.1  1.9 - 3.7 g/dL (calc) Final  . AG Ratio 05/12/2017 1.8  1.0 - 2.5 (calc) Final  . Total Bilirubin 05/12/2017 0.6  0.2 - 1.2 mg/dL Final  . Alkaline phosphatase (APISO) 05/12/2017 58  40 - 115 U/L Final  . AST 05/12/2017 17  10 - 35 U/L Final  . ALT 05/12/2017 19  9 - 46 U/L Final  . Cholesterol 05/12/2017  183  <200 mg/dL Final  . HDL 05/12/2017 43  >40 mg/dL Final  . Triglycerides 05/12/2017 46  <150 mg/dL Final  . LDL Cholesterol (Calc) 05/12/2017 126* mg/dL (calc) Final   Comment: Reference range: <100 . Desirable range <100 mg/dL for primary prevention;   <70 mg/dL for patients with CHD or diabetic patients  with > or = 2 CHD risk factors. Marland Kitchen LDL-C is now calculated using the Martin-Hopkins  calculation, which is a validated novel method providing  better accuracy than the Friedewald equation in the  estimation of LDL-C.  Cresenciano Genre et al. Annamaria Helling. 5361;443(15): 2061-2068  (http://education.[facility].com/faq/FAQ164)   . Total CHOL/HDL Ratio 05/12/2017 4.3  <5.0 (calc) Final  . Non-HDL Cholesterol (Calc) 05/12/2017 140* <130 mg/dL (calc) Final   Comment: For patients with diabetes plus 1 major ASCVD risk  factor, treating to a non-HDL-C goal of <100 mg/dL    (LDL-C of <70 mg/dL) is considered a therapeutic  option.   Marland Kitchen PSA 05/12/2017 0.6  < OR = 4.0 ng/mL Final   Comment: The total PSA value from this assay system is  standardized against the WHO standard. The test  result will be approximately 20% lower when compared  to the equimolar-standardized total PSA (Beckman  Coulter). Comparison of serial PSA results should be  interpreted with this fact in mind. . This test was performed using the Siemens  chemiluminescent method. Values obtained from  different assay methods cannot be used interchangeably. PSA levels, regardless of value, should not be interpreted as absolute evidence of the presence or absence of disease.   . Hepatitis C Ab 05/12/2017 NON-REACTIVE  NON-REACTI Final  . SIGNAL TO CUT-OFF 05/12/2017 0.02  <1.00 Final   Comment: . HCV antibody was non-reactive. There is no laboratory  evidence of HCV infection. . In most cases, no further action is required. However, if recent HCV exposure is suspected, a test for HCV RNA (test code [phone]) is suggested. . For additional information please refer to http://education.[facility].com/faq/FAQ22v1 (This link is being provided for informational/ educational purposes only.) .   Marland Kitchen HIV 1&2 Ab, 4th Generation 05/12/2017 NON-REACTIVE  NON-REACTI Final   Comment: HIV-1 antigen and HIV-1/HIV-2 antibodies were not detected. There is no laboratory evidence of HIV infection. Marland Kitchen PLEASE NOTE: This information has been disclosed to you from records whose confidentiality may be protected by state law.  If your state requires such protection, then the state law prohibits you from making any further disclosure of the information without the specific written consent of the person to whom it pertains, or as otherwise permitted by law. A general authorization for the release of medical or other information is NOT sufficient for this purpose. . For additional information please refer  to http://education.[facility].com/faq/FAQ106 (This link is being provided for informational/ educational purposes only.) . Marland Kitchen The performance of this assay has not been clinically validated in patients less than 68 years old. .    Past Medical History:  Diagnosis Date  . Allergy    wood sap  . Allergy history, seafood    oysters, mussels  . Arthritis    neck  . Bee sting allergy 1980   hives after 20 yellowjacket stings  . Cancer ([place])    "skin cancer frozen off"  . Colon polyps   . GERD (gastroesophageal reflux disease)   . Hypercholesteremia   . Hypertension    Past Surgical History:  Procedure Laterality Date  . CARPAL TUNNEL RELEASE Right 1985   elbow  .  SHOULDER ARTHROSCOPY WITH SUBACROMIAL DECOMPRESSION AND OPEN ROTATOR C Right 10/22/2012   Procedure: RIGHT SHOULDER ARTHROSCOPY WITH SUBACROMIAL DECOMPRESSION AND  ROTATOR CUFF REPAIR,  CORACOACROMIAL LIGAMENT RELEASE AND  MANIPULATION UNDER ANESTHESIA;  Surgeon: Johnn Hai, MD;  Location: [facility];  Service: Orthopedics;  Laterality: Right;   Current Outpatient Medications on File Prior to Visit  Medication Sig Dispense Refill  . amLODipine (NORVASC) 10 MG tablet TAKE 1 TABLET BY MOUTH  DAILY 90 tablet 3  . doxazosin (CARDURA) 4 MG tablet TAKE 1 TABLET BY MOUTH  DAILY 90 tablet 3  . hydrochlorothiazide (HYDRODIURIL) 25 MG tablet TAKE 1 TABLET BY MOUTH  DAILY 90 tablet 3  . levocetirizine (XYZAL) 5 MG tablet TAKE 1 TABLET BY MOUTH  EVERY EVENING 90 tablet 3  . losartan (COZAAR) 100 MG tablet TAKE 1 TABLET BY MOUTH  EVERY MORNING 90 tablet 3  . mometasone (ELOCON) 0.1 % cream Apply 1 application topically daily. (Patient taking differently: Apply 1 application topically daily. ) 45 g 0  . pantoprazole (PROTONIX) 40 MG tablet TAKE 1 TABLET BY MOUTH  DAILY 90 tablet 3  . ranitidine (ZANTAC) 300 MG tablet TAKE 1 TABLET (300 MG TOTAL) BY MOUTH AT BEDTIME. (Patient taking differently: TAKE 1 TABLET (300 MG TOTAL) BY  MOUTH AT BEDTIME. as needed) 30 tablet 11  . zafirlukast (ACCOLATE) 20 MG tablet Take 1 tablet (20 mg total) by mouth 2 (two) times daily before a meal. As needed 60 tablet 5   No current facility-administered medications on file prior to visit.    Allergies  Allergen Reactions  . Caffeine Hives  . Iodine     Topical antiseptic-as a young adult. Don't know reaction  . Other     Wood Sap causes Hives   Social History   Socioeconomic History  . Marital status: Married    Spouse name: Not on file  . Number of children: Not on file  . Years of education: Not on file  . Highest education level: Not on file  Social Needs  . Financial resource strain: Not on file  . Food insecurity - worry: Not on file  . Food insecurity - inability: Not on file  . Transportation needs - medical: Not on file  . Transportation needs - non-medical: Not on file  Occupational History  . Not on file  Tobacco Use  . Smoking status: Former Smoker    Packs/day: 1.00    Years: 15.00    Pack years: 15.00    Types: Cigarettes    Last attempt to quit: 03/04/1984    Years since quitting: 33.2  . Smokeless tobacco: Former Systems developer    Types: [place] date: 03/04/2008  Substance and Sexual Activity  . Alcohol use: No  . Drug use: No  . Sexual activity: Not on file  Other Topics Concern  . Not on file  Social History Narrative  . Not on file   Family History  Problem Relation Age of Onset  . Heart failure Mother   . Heart failure Father      Review of Systems  All other systems reviewed and are negative.      Objective:   Physical Exam  Constitutional: He is oriented to person, place, and time. He appears well-developed and well-nourished. No distress.  HENT:  Head: Normocephalic and atraumatic.  Right Ear: External ear normal.  Left Ear: External ear normal.  Nose: Nose normal.  Mouth/Throat: Oropharynx is clear and moist.  No oropharyngeal exudate.  Eyes: Conjunctivae and EOM are normal.  Pupils are equal, round, and reactive to light. Right eye exhibits no discharge. Left eye exhibits no discharge. No scleral icterus.  Neck: Normal range of motion. Neck supple. No JVD present. No tracheal deviation present. No thyromegaly present.  Cardiovascular: Normal rate, regular rhythm, normal heart sounds and intact distal pulses. Exam reveals no gallop and no friction rub.  No murmur heard. Pulmonary/Chest: Effort normal and breath sounds normal. No stridor. No respiratory distress. He has no wheezes. He has no rales. He exhibits no tenderness.  Abdominal: Soft. Bowel sounds are normal. He exhibits no distension and no mass. There is no tenderness. There is no rebound and no guarding.  Genitourinary: Rectum normal and prostate normal.  Musculoskeletal: Normal range of motion. He exhibits no edema or tenderness.  Lymphadenopathy:    He has no cervical adenopathy.  Neurological: He is alert and oriented to person, place, and time. He has normal reflexes. No cranial nerve deficit. He exhibits normal muscle tone. Coordination normal.  Skin: Skin is warm. No rash noted. He is not diaphoretic. No erythema. No pallor.  Psychiatric: He has a normal mood and affect. His behavior is normal. Judgment and thought content normal.  Vitals reviewed.         Assessment & Plan:  Routine general medical examination at a health care facility  Benign essential HTN  Pure hypercholesterolemia  Patient's labs are outstanding.  His colonoscopy is up-to-date.  Prostate cancer screening is performed today and is normal with a normal digital rectal exam and a normal PSA.  HIV screening and hepatitis A screening are negative.  Blood pressure is excellent.  Cholesterol slightly elevated off his statin.  I have recommended continued diet exercise and weight loss.  Recheck in 1 year or as needed.  I recommended the shingles vaccine.

## 2017-05-21 ENCOUNTER — Other Ambulatory Visit: Payer: Self-pay | Admitting: Family Medicine

## 2017-05-21 DIAGNOSIS — I10 Essential (primary) hypertension: Secondary | ICD-10-CM

## 2017-05-23 ENCOUNTER — Other Ambulatory Visit: Payer: Self-pay | Admitting: Family Medicine

## 2017-05-23 DIAGNOSIS — I10 Essential (primary) hypertension: Secondary | ICD-10-CM

## 2017-06-11 ENCOUNTER — Ambulatory Visit (INDEPENDENT_AMBULATORY_CARE_PROVIDER_SITE_OTHER): Payer: Medicare Other | Admitting: Family Medicine

## 2017-06-11 DIAGNOSIS — Z23 Encounter for immunization: Secondary | ICD-10-CM

## 2017-06-11 MED ORDER — ZOSTER VAC RECOMB ADJUVANTED 50 MCG/0.5ML IM SUSR: 0.5000 mL | Freq: Once | INTRAMUSCULAR | 1 refills | Status: AC

## 2017-06-17 ENCOUNTER — Other Ambulatory Visit: Payer: Self-pay | Admitting: Family Medicine

## 2017-07-26 ENCOUNTER — Encounter: Payer: Self-pay | Admitting: Emergency Medicine

## 2017-07-26 ENCOUNTER — Other Ambulatory Visit: Payer: Self-pay

## 2017-07-26 ENCOUNTER — Ambulatory Visit (INDEPENDENT_AMBULATORY_CARE_PROVIDER_SITE_OTHER): Payer: Medicare Other | Admitting: Emergency Medicine

## 2017-07-26 VITALS — BP 130/78 | HR 64 | Temp 98.7°F | Resp 18 | Ht 70.0 in | Wt 179.0 lb

## 2017-07-26 DIAGNOSIS — T7840XA Allergy, unspecified, initial encounter: Secondary | ICD-10-CM | POA: Diagnosis not present

## 2017-07-26 MED ORDER — METHYLPREDNISOLONE ACETATE 80 MG/ML IJ SUSP
80.0000 mg | Freq: Once | INTRAMUSCULAR | Status: AC
  Administered 2017-07-26: 80 mg via INTRAMUSCULAR

## 2017-07-26 MED ORDER — PREDNISONE 20 MG PO TABS: 40.0000 mg | ORAL_TABLET | Freq: Every day | ORAL | 0 refills | Status: AC

## 2017-07-26 NOTE — Progress Notes (Signed)
Laurence Ferrari 67 y.o.   Chief Complaint  Patient presents with  . Allergic Reaction    skin color change, itchy face and eyelids, rash on stomache     HISTORY OF PRESENT ILLNESS: This is a 67 y.o. male complaining of allergic reaction that started 2 days ago after exposure to wood sap.  Complaining of itchy facial rash and also in the stomach area.  Denies difficulty breathing.  Denies wheezing.  Denies tongue swelling or lip swelling.  HPI   Prior to Admission medications   Medication Sig Start Date End Date Taking? Authorizing Provider  amLODipine (NORVASC) 10 MG tablet TAKE 1 TABLET BY MOUTH  DAILY 05/23/17  Yes Susy Frizzle, MD  doxazosin (CARDURA) 4 MG tablet TAKE 1 TABLET BY MOUTH  DAILY 05/23/17  Yes Susy Frizzle, MD  hydrochlorothiazide (HYDRODIURIL) 25 MG tablet TAKE 1 TABLET BY MOUTH  DAILY 05/23/17  Yes Susy Frizzle, MD  levocetirizine (XYZAL) 5 MG tablet TAKE 1 TABLET BY MOUTH  EVERY EVENING 05/23/17  Yes Susy Frizzle, MD  losartan (COZAAR) 100 MG tablet TAKE 1 TABLET BY MOUTH  EVERY MORNING 05/23/17  Yes Susy Frizzle, MD  mometasone (ELOCON) 0.1 % cream Apply 1 application topically daily. Patient taking differently: Apply 1 application topically daily.  06/24/16  Yes Susy Frizzle, MD  pantoprazole (PROTONIX) 40 MG tablet TAKE 1 TABLET BY MOUTH  DAILY 05/23/17  Yes Susy Frizzle, MD  pravastatin (PRAVACHOL) 40 MG tablet TAKE 1 TABLET BY MOUTH AT  BEDTIME 05/23/17  Yes Susy Frizzle, MD  ranitidine (ZANTAC) 300 MG tablet TAKE 1 TABLET (300 MG TOTAL) BY MOUTH AT BEDTIME. Patient taking differently: TAKE 1 TABLET (300 MG TOTAL) BY MOUTH AT BEDTIME. as needed 11/08/14  Yes Pickard, Cammie Mcgee, MD  zafirlukast (ACCOLATE) 20 MG tablet TAKE 1 TABLET (20 MG TOTAL) BY MOUTH 2 (TWO) TIMES DAILY BEFORE A MEAL. AS NEEDED 06/17/17  Yes Susy Frizzle, MD    Allergies  Allergen Reactions  . Caffeine Hives  . Iodine     Topical antiseptic-as a young  adult. Don't know reaction  . Other     Wood Sap causes Hives    Patient Active Problem List   Diagnosis Date Noted  . Colon polyps   . HTN (hypertension) 06/18/2012  . HLD (hyperlipidemia) 06/18/2012  . GERD (gastroesophageal reflux disease)     Past Medical History:  Diagnosis Date  . Allergy    wood sap  . Allergy history, seafood    oysters, mussels  . Arthritis    neck  . Bee sting allergy 1980   hives after 20 yellowjacket stings  . Cancer ([place])    "skin cancer frozen off"  . Colon polyps   . GERD (gastroesophageal reflux disease)   . Hypercholesteremia   . Hypertension     Past Surgical History:  Procedure Laterality Date  . CARPAL TUNNEL RELEASE Right 1985   elbow  . SHOULDER ARTHROSCOPY WITH SUBACROMIAL DECOMPRESSION AND OPEN ROTATOR C Right 10/22/2012   Procedure: RIGHT SHOULDER ARTHROSCOPY WITH SUBACROMIAL DECOMPRESSION AND  ROTATOR CUFF REPAIR,  CORACOACROMIAL LIGAMENT RELEASE AND  MANIPULATION UNDER ANESTHESIA;  Surgeon: Johnn Hai, MD;  Location: [facility];  Service: Orthopedics;  Laterality: Right;    Social History   Socioeconomic History  . Marital status: Married    Spouse name: Not on file  . Number of children: Not on file  . Years of education: Not on  file  . Highest education level: Not on file  Occupational History  . Not on file  Social Needs  . Financial resource strain: Not on file  . Food insecurity:    Worry: Not on file    Inability: Not on file  . Transportation needs:    Medical: Not on file    Non-medical: Not on file  Tobacco Use  . Smoking status: Former Smoker    Packs/day: 1.00    Years: 15.00    Pack years: 15.00    Types: Cigarettes    Last attempt to quit: 03/04/1984    Years since quitting: 33.4  . Smokeless tobacco: Former Systems developer    Types: [place] date: 03/04/2008  Substance and Sexual Activity  . Alcohol use: No  . Drug use: No  . Sexual activity: Not on file  Lifestyle  . Physical activity:    Days  per week: Not on file    Minutes per session: Not on file  . Stress: Not on file  Relationships  . Social connections:    Talks on phone: Not on file    Gets together: Not on file    Attends religious service: Not on file    Active member of club or organization: Not on file    Attends meetings of clubs or organizations: Not on file    Relationship status: Not on file  . Intimate partner violence:    Fear of current or ex partner: Not on file    Emotionally abused: Not on file    Physically abused: Not on file    Forced sexual activity: Not on file  Other Topics Concern  . Not on file  Social History Narrative  . Not on file    Family History  Problem Relation Age of Onset  . Heart failure Mother   . Heart failure Father      Review of Systems  Constitutional: Negative.  Negative for chills and fever.  HENT: Negative.  Negative for congestion, nosebleeds and sore throat.   Eyes: Negative.  Negative for blurred vision and double vision.  Respiratory: Negative.  Negative for cough and shortness of breath.   Cardiovascular: Negative.  Negative for chest pain and palpitations.  Gastrointestinal: Negative.  Negative for abdominal pain, nausea and vomiting.  Genitourinary: Negative.   Musculoskeletal: Negative.   Skin: Positive for itching and rash.  Neurological: Negative.  Negative for dizziness and headaches.  Endo/Heme/Allergies: Negative.   All other systems reviewed and are negative.    Vitals:   07/26/17 1154  BP: 130/78  Pulse: 64  Resp: 18  Temp: 98.7 F (37.1 C)  SpO2: 98%     Physical Exam  Constitutional: He is oriented to person, place, and time. He appears well-developed and well-nourished.  HENT:  Head: Normocephalic and atraumatic.  Nose: Nose normal.  Mouth/Throat: Oropharynx is clear and moist.  Eyes: Pupils are equal, round, and reactive to light. Conjunctivae and EOM are normal.  Neck: Normal range of motion. Neck supple. No thyromegaly  present.  Cardiovascular: Normal rate, regular rhythm and normal heart sounds.  Pulmonary/Chest: Effort normal and breath sounds normal.  Abdominal: Soft. There is no tenderness.  Musculoskeletal: Normal range of motion.  Lymphadenopathy:    He has no cervical adenopathy.  Neurological: He is alert and oriented to person, place, and time. No sensory deficit. He exhibits normal muscle tone.  Skin: Capillary refill takes less than 2 seconds. Rash noted.  Facial  erythema with some mild swelling.  Similar findings on abdomen  Psychiatric: He has a normal mood and affect. His behavior is normal.  Vitals reviewed.  A total of 30 minutes was spent in the room with the patient, greater than 50% of which was in counseling/coordination of care regarding differential diagnosis, treatment, medications, prognosis and need for follow-up if worse or no better..   ASSESSMENT & PLAN: Caedin was seen today for allergic reaction.  Diagnoses and all orders for this visit:  Acute allergic reaction, initial encounter -     methylPREDNISolone acetate (DEPO-MEDROL) injection 80 mg -     predniSONE (DELTASONE) 20 MG tablet; Take 2 tablets (40 mg total) by mouth daily with breakfast for 5 days.    Patient Instructions       IF you received an x-ray today, you will receive an invoice from [facility]. Please contact [facility] at [phone] with questions or concerns regarding your invoice.   IF you received labwork today, you will receive an invoice from [place]. Please contact [facility] at [phone] with questions or concerns regarding your invoice.   Our billing staff will not be able to assist you with questions regarding bills from these companies.  You will be contacted with the lab results as soon as they are available. The fastest way to get your results is to activate your My Chart account. Instructions are located on the last page of this paperwork. If you have not  heard from [place] regarding the results in 2 weeks, please contact this office.     Allergies An allergy is when your body reacts to a substance in a way that is not normal. An allergic reaction can happen after you:  Eat something.  Breathe in something.  Touch something.  You can be allergic to:  Things that are only around during certain seasons, like molds and pollens.  Foods.  Drugs.  Insects.  Animal dander.  What are the signs or symptoms?  Puffiness (swelling). This may happen on the lips, face, tongue, mouth, or throat.  Sneezing.  Coughing.  Breathing loudly (wheezing).  Stuffy nose.  Tingling in the mouth.  A rash.  Itching.  Itchy, red, puffy areas of skin (hives).  Watery eyes.  Throwing up (vomiting).  Watery poop (diarrhea).  Dizziness.  Feeling faint or fainting.  Trouble breathing or swallowing.  A tight feeling in the chest.  A fast heartbeat. How is this diagnosed? Allergies can be diagnosed with:  A medical and family history.  Skin tests.  Blood tests.  A food diary. A food diary is a record of all the foods, drinks, and symptoms you have each day.  The results of an elimination diet. This diet involves making sure not to eat certain foods and then seeing what happens when you start eating them again.  How is this treated? There is no cure for allergies, but allergic reactions can be treated with medicine. Severe reactions usually need to be treated at a hospital. How is this prevented? The best way to prevent an allergic reaction is to avoid the thing you are allergic to. Allergy shots and medicines can also help prevent reactions in some cases. This information is not intended to replace advice given to you by your health care provider. Make sure you discuss any questions you have with your health care provider. Document Released: 06/15/2012 Document Revised: 10/16/2015 Document Reviewed: 11/30/2013 Elsevier Interactive  Patient Education  [employer].  Agustina Caroli, MD [facility]

## 2017-07-26 NOTE — Patient Instructions (Addendum)
     IF you received an x-ray today, you will receive an invoice from [facility]. Please contact [facility] at [phone] with questions or concerns regarding your invoice.   IF you received labwork today, you will receive an invoice from [place]. Please contact [facility] at [phone] with questions or concerns regarding your invoice.   Our billing staff will not be able to assist you with questions regarding bills from these companies.  You will be contacted with the lab results as soon as they are available. The fastest way to get your results is to activate your My Chart account. Instructions are located on the last page of this paperwork. If you have not heard from [place] regarding the results in 2 weeks, please contact this office.     Allergies An allergy is when your body reacts to a substance in a way that is not normal. An allergic reaction can happen after you:  Eat something.  Breathe in something.  Touch something.  You can be allergic to:  Things that are only around during certain seasons, like molds and pollens.  Foods.  Drugs.  Insects.  Animal dander.  What are the signs or symptoms?  Puffiness (swelling). This may happen on the lips, face, tongue, mouth, or throat.  Sneezing.  Coughing.  Breathing loudly (wheezing).  Stuffy nose.  Tingling in the mouth.  A rash.  Itching.  Itchy, red, puffy areas of skin (hives).  Watery eyes.  Throwing up (vomiting).  Watery poop (diarrhea).  Dizziness.  Feeling faint or fainting.  Trouble breathing or swallowing.  A tight feeling in the chest.  A fast heartbeat. How is this diagnosed? Allergies can be diagnosed with:  A medical and family history.  Skin tests.  Blood tests.  A food diary. A food diary is a record of all the foods, drinks, and symptoms you have each day.  The results of an elimination diet. This diet involves making sure not to eat certain foods  and then seeing what happens when you start eating them again.  How is this treated? There is no cure for allergies, but allergic reactions can be treated with medicine. Severe reactions usually need to be treated at a hospital. How is this prevented? The best way to prevent an allergic reaction is to avoid the thing you are allergic to. Allergy shots and medicines can also help prevent reactions in some cases. This information is not intended to replace advice given to you by your health care provider. Make sure you discuss any questions you have with your health care provider. Document Released: 06/15/2012 Document Revised: 10/16/2015 Document Reviewed: 11/30/2013 Elsevier Interactive Patient Education  [employer].

## 2017-09-17 DIAGNOSIS — H2513 Age-related nuclear cataract, bilateral: Secondary | ICD-10-CM | POA: Diagnosis not present

## 2017-09-17 DIAGNOSIS — H25013 Cortical age-related cataract, bilateral: Secondary | ICD-10-CM | POA: Diagnosis not present

## 2017-09-17 DIAGNOSIS — H524 Presbyopia: Secondary | ICD-10-CM | POA: Diagnosis not present

## 2017-09-17 DIAGNOSIS — H5203 Hypermetropia, bilateral: Secondary | ICD-10-CM | POA: Diagnosis not present

## 2017-09-29 ENCOUNTER — Other Ambulatory Visit: Payer: Self-pay | Admitting: *Deleted

## 2017-09-29 MED ORDER — ZAFIRLUKAST 20 MG PO TABS: 20.0000 mg | ORAL_TABLET | Freq: Two times a day (BID) | ORAL | 3 refills | Status: DC

## 2017-12-23 ENCOUNTER — Other Ambulatory Visit: Payer: Self-pay | Admitting: Family Medicine

## 2017-12-23 DIAGNOSIS — L239 Allergic contact dermatitis, unspecified cause: Secondary | ICD-10-CM

## 2017-12-29 DIAGNOSIS — Z86010 Personal history of colonic polyps: Secondary | ICD-10-CM | POA: Diagnosis not present

## 2017-12-29 DIAGNOSIS — I10 Essential (primary) hypertension: Secondary | ICD-10-CM | POA: Diagnosis not present

## 2017-12-29 DIAGNOSIS — K219 Gastro-esophageal reflux disease without esophagitis: Secondary | ICD-10-CM | POA: Diagnosis not present

## 2018-01-05 ENCOUNTER — Encounter: Payer: Self-pay | Admitting: Family Medicine

## 2018-01-05 ENCOUNTER — Ambulatory Visit (INDEPENDENT_AMBULATORY_CARE_PROVIDER_SITE_OTHER): Payer: Medicare Other | Admitting: Family Medicine

## 2018-01-05 VITALS — BP 110/72 | HR 60 | Temp 98.1°F | Resp 14 | Ht 71.5 in | Wt 187.0 lb

## 2018-01-05 DIAGNOSIS — M546 Pain in thoracic spine: Secondary | ICD-10-CM | POA: Diagnosis not present

## 2018-01-05 MED ORDER — PREDNISONE 20 MG PO TABS: ORAL_TABLET | ORAL | 0 refills | Status: DC

## 2018-01-05 MED ORDER — CYCLOBENZAPRINE HCL 10 MG PO TABS: 10.0000 mg | ORAL_TABLET | Freq: Three times a day (TID) | ORAL | 0 refills | Status: DC | PRN
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
## 2018-03-12 ENCOUNTER — Encounter: Payer: Self-pay | Admitting: Family Medicine

## 2018-03-12 DIAGNOSIS — K635 Polyp of colon: Secondary | ICD-10-CM | POA: Diagnosis not present

## 2018-03-12 DIAGNOSIS — D125 Benign neoplasm of sigmoid colon: Secondary | ICD-10-CM | POA: Diagnosis not present

## 2018-03-12 DIAGNOSIS — D122 Benign neoplasm of ascending colon: Secondary | ICD-10-CM | POA: Diagnosis not present

## 2018-03-12 DIAGNOSIS — K6389 Other specified diseases of intestine: Secondary | ICD-10-CM | POA: Diagnosis not present

## 2018-03-12 DIAGNOSIS — Z86010 Personal history of colonic polyps: Secondary | ICD-10-CM | POA: Diagnosis not present

## 2018-03-12 DIAGNOSIS — D123 Benign neoplasm of transverse colon: Secondary | ICD-10-CM | POA: Diagnosis not present

## 2018-03-12 LAB — HM COLONOSCOPY

## 2018-03-18 DIAGNOSIS — L814 Other melanin hyperpigmentation: Secondary | ICD-10-CM | POA: Diagnosis not present

## 2018-03-18 DIAGNOSIS — D485 Neoplasm of uncertain behavior of skin: Secondary | ICD-10-CM | POA: Diagnosis not present

## 2018-03-18 DIAGNOSIS — B078 Other viral warts: Secondary | ICD-10-CM | POA: Diagnosis not present

## 2018-03-18 DIAGNOSIS — L570 Actinic keratosis: Secondary | ICD-10-CM | POA: Diagnosis not present

## 2018-03-18 DIAGNOSIS — D225 Melanocytic nevi of trunk: Secondary | ICD-10-CM | POA: Diagnosis not present

## 2018-03-21 ENCOUNTER — Encounter: Payer: Self-pay | Admitting: *Deleted

## 2018-03-24 ENCOUNTER — Encounter: Payer: Self-pay | Admitting: Family Medicine

## 2018-05-22 ENCOUNTER — Other Ambulatory Visit: Payer: Medicare Other

## 2018-05-22 ENCOUNTER — Other Ambulatory Visit: Payer: Self-pay

## 2018-05-22 DIAGNOSIS — Z125 Encounter for screening for malignant neoplasm of prostate: Secondary | ICD-10-CM

## 2018-05-22 DIAGNOSIS — Z0000 Encounter for general adult medical examination without abnormal findings: Secondary | ICD-10-CM | POA: Diagnosis not present

## 2018-05-22 DIAGNOSIS — I10 Essential (primary) hypertension: Secondary | ICD-10-CM | POA: Diagnosis not present

## 2018-05-22 DIAGNOSIS — E7800 Pure hypercholesterolemia, unspecified: Secondary | ICD-10-CM

## 2018-05-23 LAB — CBC WITH DIFFERENTIAL/PLATELET
ABSOLUTE MONOCYTES: 456 {cells}/uL (ref 200–950)
BASOS ABS: 72 {cells}/uL (ref 0–200)
BASOS PCT: 1.5 %
EOS ABS: 110 {cells}/uL (ref 15–500)
Eosinophils Relative: 2.3 %
HCT: 45.1 % (ref 38.5–50.0)
HEMOGLOBIN: 15.4 g/dL (ref 13.2–17.1)
Lymphs Abs: 1171 cells/uL (ref 850–3900)
MCH: 32 pg (ref 27.0–33.0)
MCHC: 34.1 g/dL (ref 32.0–36.0)
MCV: 93.6 fL (ref 80.0–100.0)
MPV: 10.5 fL (ref 7.5–12.5)
Monocytes Relative: 9.5 %
NEUTROS ABS: 2990 {cells}/uL (ref 1500–7800)
Neutrophils Relative %: 62.3 %
Platelets: 265 10*3/uL (ref 140–400)
RBC: 4.82 10*6/uL (ref 4.20–5.80)
RDW: 12.2 % (ref 11.0–15.0)
TOTAL LYMPHOCYTE: 24.4 %
WBC: 4.8 10*3/uL (ref 3.8–10.8)

## 2018-05-23 LAB — COMPREHENSIVE METABOLIC PANEL
AG Ratio: 1.9 (calc) (ref 1.0–2.5)
ALT: 17 U/L (ref 9–46)
AST: 24 U/L (ref 10–35)
Albumin: 4.1 g/dL (ref 3.6–5.1)
Alkaline phosphatase (APISO): 61 U/L (ref 35–144)
BUN: 21 mg/dL (ref 7–25)
CALCIUM: 9.4 mg/dL (ref 8.6–10.3)
CO2: 29 mmol/L (ref 20–32)
Chloride: 106 mmol/L (ref 98–110)
Creat: 0.9 mg/dL (ref 0.70–1.25)
Globulin: 2.2 g/dL (calc) (ref 1.9–3.7)
Glucose, Bld: 90 mg/dL (ref 65–99)
POTASSIUM: 4 mmol/L (ref 3.5–5.3)
SODIUM: 145 mmol/L (ref 135–146)
TOTAL PROTEIN: 6.3 g/dL (ref 6.1–8.1)
Total Bilirubin: 0.7 mg/dL (ref 0.2–1.2)

## 2018-05-23 LAB — LIPID PANEL
Cholesterol: 209 mg/dL — ABNORMAL HIGH (ref <200)
HDL: 49 mg/dL (ref >=40)
LDL Cholesterol (Calc): 143 mg/dL (calc) — ABNORMAL HIGH
Non-HDL Cholesterol (Calc): 160 mg/dL (calc) — ABNORMAL HIGH (ref <130)
TRIGLYCERIDES: 73 mg/dL (ref <150)
Total CHOL/HDL Ratio: 4.3 (calc) (ref <5.0)

## 2018-05-23 LAB — PSA: PSA: 0.5 ng/mL (ref <=4.0)

## 2018-05-25 ENCOUNTER — Other Ambulatory Visit: Payer: Medicare Other

## 2018-05-26 ENCOUNTER — Other Ambulatory Visit: Payer: Self-pay

## 2018-05-26 ENCOUNTER — Ambulatory Visit (INDEPENDENT_AMBULATORY_CARE_PROVIDER_SITE_OTHER): Payer: Medicare Other | Admitting: Family Medicine

## 2018-05-26 ENCOUNTER — Encounter: Payer: Self-pay | Admitting: Family Medicine

## 2018-05-26 VITALS — BP 140/76 | HR 62 | Temp 98.6°F | Resp 16 | Ht 71.5 in | Wt 195.0 lb

## 2018-05-26 DIAGNOSIS — Z136 Encounter for screening for cardiovascular disorders: Secondary | ICD-10-CM | POA: Diagnosis not present

## 2018-05-26 DIAGNOSIS — Z0001 Encounter for general adult medical examination with abnormal findings: Secondary | ICD-10-CM | POA: Diagnosis not present

## 2018-05-26 DIAGNOSIS — Z0000 Encounter for general adult medical examination without abnormal findings: Secondary | ICD-10-CM

## 2018-05-26 DIAGNOSIS — I10 Essential (primary) hypertension: Secondary | ICD-10-CM | POA: Diagnosis not present

## 2018-05-26 DIAGNOSIS — E7800 Pure hypercholesterolemia, unspecified: Secondary | ICD-10-CM

## 2018-05-26 DIAGNOSIS — Z23 Encounter for immunization: Secondary | ICD-10-CM | POA: Diagnosis not present

## 2018-05-26 MED ORDER — ROSUVASTATIN CALCIUM 10 MG PO TABS: 10.0000 mg | ORAL_TABLET | Freq: Every day | ORAL | 3 refills | Status: DC

## 2018-05-26 NOTE — Addendum Note (Signed)
Addended by: Shary Decamp B on: 05/26/2018 09:24 AM   Modules accepted: Orders

## 2018-05-26 NOTE — Progress Notes (Signed)
Subjective:    Patient ID: [name], male    DOB: 1950/10/30, 68 y.o.   MRN: [id number]  HPI  Patient is here today for complete physical exam. His last colonoscopy was in 2020.  Due to polyps, repeat colonoscopy was recommended in 3 years (2023).  He is due for prostate cancer screening today.  He is due for pneumovax 23.  Immunization records are included below.  He denies any falls, symptoms of depression, or memory loss.  He denies any difficulties with performing his iADL's.  Patient quit his pravastatin 3 years ago.  Based on his current cholesterol numbers, his ten-year risk of cardiovascular disease is 19%.  Therefore we had a long discussion today about resuming statin therapy.  He also has a family history of a father who had a AAA.  He smoked from age 74 until his mid 30s.  He is now over the age of 64 and AAA screening would be recommended. Immunization History  Administered Date(s) Administered  . Influenza Split 12/16/2012  . Influenza,inj,Quad PF,6+ Mos 12/27/2015, 12/11/2016  . Influenza,trivalent, recombinat, inj, PF 10/21/2017  . Influenza-Unspecified 01/02/2014  . Pneumococcal Conjugate-13 06/11/2017  . Tdap 01/25/2011  . Zoster 01/03/2014  . Zoster Recombinat (Shingrix) 07/16/2017    Lab on 05/22/2018  Component Date Value Ref Range Status  . WBC 05/22/2018 4.8  3.8 - 10.8 Thousand/uL Final  . RBC 05/22/2018 4.82  4.20 - 5.80 Million/uL Final  . Hemoglobin 05/22/2018 15.4  13.2 - 17.1 g/dL Final  . HCT 05/22/2018 45.1  38.5 - 50.0 % Final  . MCV 05/22/2018 93.6  80.0 - 100.0 fL Final  . MCH 05/22/2018 32.0  27.0 - 33.0 pg Final  . MCHC 05/22/2018 34.1  32.0 - 36.0 g/dL Final  . RDW 05/22/2018 12.2  11.0 - 15.0 % Final  . Platelets 05/22/2018 265  140 - 400 Thousand/uL Final  . MPV 05/22/2018 10.5  7.5 - 12.5 fL Final  . Neutro Abs 05/22/2018 2,990  1,500 - 7,800 cells/uL Final  . Lymphs Abs 05/22/2018 1,171  850 - 3,900 cells/uL Final  . Absolute  Monocytes 05/22/2018 456  200 - 950 cells/uL Final  . Eosinophils Absolute 05/22/2018 110  15 - 500 cells/uL Final  . Basophils Absolute 05/22/2018 72  0 - 200 cells/uL Final  . Neutrophils Relative % 05/22/2018 62.3  % Final  . Total Lymphocyte 05/22/2018 24.4  % Final  . Monocytes Relative 05/22/2018 9.5  % Final  . Eosinophils Relative 05/22/2018 2.3  % Final  . Basophils Relative 05/22/2018 1.5  % Final  . Glucose, Bld 05/22/2018 90  65 - 99 mg/dL Final   Comment: .            Fasting reference interval .   . BUN 05/22/2018 21  7 - 25 mg/dL Final  . Creat 05/22/2018 0.90  0.70 - 1.25 mg/dL Final   Comment: For patients >20 years of age, the reference limit for Creatinine is approximately 13% higher for people identified as African-American. .   Havery Moros Ratio 00/93/8182 NOT APPLICABLE  6 - 22 (calc) Final  . Sodium 05/22/2018 145  135 - 146 mmol/L Final  . Potassium 05/22/2018 4.0  3.5 - 5.3 mmol/L Final  . Chloride 05/22/2018 106  98 - 110 mmol/L Final  . CO2 05/22/2018 29  20 - 32 mmol/L Final  . Calcium 05/22/2018 9.4  8.6 - 10.3 mg/dL Final  . Total Protein 05/22/2018 6.3  6.1 -  8.1 g/dL Final  . Albumin 05/22/2018 4.1  3.6 - 5.1 g/dL Final  . Globulin 05/22/2018 2.2  1.9 - 3.7 g/dL (calc) Final  . AG Ratio 05/22/2018 1.9  1.0 - 2.5 (calc) Final  . Total Bilirubin 05/22/2018 0.7  0.2 - 1.2 mg/dL Final  . Alkaline phosphatase (APISO) 05/22/2018 61  35 - 144 U/L Final  . AST 05/22/2018 24  10 - 35 U/L Final  . ALT 05/22/2018 17  9 - 46 U/L Final  . Cholesterol 05/22/2018 209* <200 mg/dL Final  . HDL 05/22/2018 49  > OR = 40 mg/dL Final  . Triglycerides 05/22/2018 73  <150 mg/dL Final  . LDL Cholesterol (Calc) 05/22/2018 143* mg/dL (calc) Final   Comment: Reference range: <100 . Desirable range <100 mg/dL for primary prevention;   <70 mg/dL for patients with CHD or diabetic patients  with > or = 2 CHD risk factors. Marland Kitchen LDL-C is now calculated using the  Martin-Hopkins  calculation, which is a validated novel method providing  better accuracy than the Friedewald equation in the  estimation of LDL-C.  Cresenciano Genre et al. Annamaria Helling. 7062;376(28): 2061-2068  (http://education.[facility].com/faq/FAQ164)   . Total CHOL/HDL Ratio 05/22/2018 4.3  <5.0 (calc) Final  . Non-HDL Cholesterol (Calc) 05/22/2018 160* <130 mg/dL (calc) Final   Comment: For patients with diabetes plus 1 major ASCVD risk  factor, treating to a non-HDL-C goal of <100 mg/dL  (LDL-C of <70 mg/dL) is considered a therapeutic  option.   Marland Kitchen PSA 05/22/2018 0.5  < OR = 4.0 ng/mL Final   Comment: The total PSA value from this assay system is  standardized against the WHO standard. The test  result will be approximately 20% lower when compared  to the equimolar-standardized total PSA (Beckman  Coulter). Comparison of serial PSA results should be  interpreted with this fact in mind. . This test was performed using the Siemens  chemiluminescent method. Values obtained from  different assay methods cannot be used interchangeably. PSA levels, regardless of value, should not be interpreted as absolute evidence of the presence or absence of disease.    Past Medical History:  Diagnosis Date  . Allergy    wood sap  . Allergy history, seafood    oysters, mussels  . Arthritis    neck  . Bee sting allergy 1980   hives after 20 yellowjacket stings  . Cancer ([place])    "skin cancer frozen off"  . Colon polyps    q 3 years, last colonoscopy 03/12/18  . GERD (gastroesophageal reflux disease)   . Hypercholesteremia   . Hypertension    Past Surgical History:  Procedure Laterality Date  . CARPAL TUNNEL RELEASE Right 1985   elbow  . SHOULDER ARTHROSCOPY WITH SUBACROMIAL DECOMPRESSION AND OPEN ROTATOR C Right 10/22/2012   Procedure: RIGHT SHOULDER ARTHROSCOPY WITH SUBACROMIAL DECOMPRESSION AND  ROTATOR CUFF REPAIR,  CORACOACROMIAL LIGAMENT RELEASE AND  MANIPULATION UNDER ANESTHESIA;   Surgeon: Johnn Hai, MD;  Location: [facility];  Service: Orthopedics;  Laterality: Right;   Current Outpatient Medications on File Prior to Visit  Medication Sig Dispense Refill  . amLODipine (NORVASC) 10 MG tablet TAKE 1 TABLET BY MOUTH  DAILY 120 tablet 3  . cyclobenzaprine (FLEXERIL) 10 MG tablet Take 1 tablet (10 mg total) by mouth 3 (three) times daily as needed for muscle spasms. 30 tablet 0  . doxazosin (CARDURA) 4 MG tablet TAKE 1 TABLET BY MOUTH  DAILY 120 tablet 3  . hydrochlorothiazide (HYDRODIURIL) 25  MG tablet TAKE 1 TABLET BY MOUTH  DAILY 120 tablet 3  . levocetirizine (XYZAL) 5 MG tablet TAKE 1 TABLET BY MOUTH  EVERY EVENING 120 tablet 3  . losartan (COZAAR) 100 MG tablet TAKE 1 TABLET BY MOUTH  EVERY MORNING 120 tablet 3  . mometasone (ELOCON) 0.1 % cream Apply 1 application topically daily. 45 g 11  . pantoprazole (PROTONIX) 40 MG tablet TAKE 1 TABLET BY MOUTH  DAILY 120 tablet 3  . zafirlukast (ACCOLATE) 20 MG tablet Take 1 tablet (20 mg total) by mouth 2 (two) times daily before a meal. As needed 180 tablet 3   No current facility-administered medications on file prior to visit.     Allergies  Allergen Reactions  . Caffeine Hives  . Iodine     Topical antiseptic-as a young adult. Don't know reaction  . Other     Wood Sap causes Hives   Social History   Socioeconomic History  . Marital status: Married    Spouse name: Not on file  . Number of children: Not on file  . Years of education: Not on file  . Highest education level: Not on file  Occupational History  . Not on file  Social Needs  . Financial resource strain: Not on file  . Food insecurity:    Worry: Not on file    Inability: Not on file  . Transportation needs:    Medical: Not on file    Non-medical: Not on file  Tobacco Use  . Smoking status: Former Smoker    Packs/day: 1.00    Years: 15.00    Pack years: 15.00    Types: Cigarettes    Last attempt to quit: 03/04/1984    Years since  quitting: 34.2  . Smokeless tobacco: Former Systems developer    Types: [place] date: 03/04/2008  Substance and Sexual Activity  . Alcohol use: No  . Drug use: No  . Sexual activity: Not on file  Lifestyle  . Physical activity:    Days per week: Not on file    Minutes per session: Not on file  . Stress: Not on file  Relationships  . Social connections:    Talks on phone: Not on file    Gets together: Not on file    Attends religious service: Not on file    Active member of club or organization: Not on file    Attends meetings of clubs or organizations: Not on file    Relationship status: Not on file  . Intimate partner violence:    Fear of current or ex partner: Not on file    Emotionally abused: Not on file    Physically abused: Not on file    Forced sexual activity: Not on file  Other Topics Concern  . Not on file  Social History Narrative  . Not on file   Family History  Problem Relation Age of Onset  . Heart failure Mother   . Heart failure Father      Review of Systems  All other systems reviewed and are negative.      Objective:   Physical Exam  Constitutional: He is oriented to person, place, and time. He appears well-developed and well-nourished. No distress.  HENT:  Head: Normocephalic and atraumatic.  Right Ear: External ear normal.  Left Ear: External ear normal.  Nose: Nose normal.  Mouth/Throat: Oropharynx is clear and moist. No oropharyngeal exudate.  Eyes: Pupils are equal, round, and reactive  to light. Conjunctivae and EOM are normal. Right eye exhibits no discharge. Left eye exhibits no discharge. No scleral icterus.  Neck: Normal range of motion. Neck supple. No JVD present. No tracheal deviation present. No thyromegaly present.  Cardiovascular: Normal rate, regular rhythm, normal heart sounds and intact distal pulses. Exam reveals no gallop and no friction rub.  No murmur heard. Pulmonary/Chest: Effort normal and breath sounds normal. No stridor. No  respiratory distress. He has no wheezes. He has no rales. He exhibits no tenderness.  Abdominal: Soft. Bowel sounds are normal. He exhibits no distension and no mass. There is no abdominal tenderness. There is no rebound and no guarding.  Genitourinary:    Prostate and rectum normal.   Musculoskeletal: Normal range of motion.        General: No tenderness or edema.  Lymphadenopathy:    He has no cervical adenopathy.  Neurological: He is alert and oriented to person, place, and time. He has normal reflexes. No cranial nerve deficit. He exhibits normal muscle tone. Coordination normal.  Skin: Skin is warm. No rash noted. He is not diaphoretic. No erythema. No pallor.  Psychiatric: He has a normal mood and affect. His behavior is normal. Judgment and thought content normal.  Vitals reviewed.         Assessment & Plan:  Screening for AAA (abdominal aortic aneurysm) - Plan: VAS [place] AAA DUPLEX  Annual physical exam  Benign essential HTN  Pure hypercholesterolemia  Patient's physical exam today is normal.  Cancer screening is up-to-date.  He is due for his next colonoscopy in 2023.  He received Pneumovax 23 today.  The remainder of his immunizations are up-to-date.  He and hepatitis C screening is up-to-date.  I did recommend screening for AAA and I will schedule that for the patient.  Blood pressure is acceptable.  I will start Crestor 10 mg a day and recheck fasting lipid panel in 6 months.

## 2018-05-28 ENCOUNTER — Telehealth: Payer: Self-pay | Admitting: Family Medicine

## 2018-05-28 NOTE — Telephone Encounter (Signed)
[facility] called today to notify us that they are not scheduling any [place] duplex screening for 4 weeks out. They wanted to [place] know and if you would like the AAA duplex on this patient urgently we can call them back at [phone] to get patient scheduled sooner. Please advise?

## 2018-05-29 NOTE — Telephone Encounter (Signed)
Not urgent

## 2018-07-01 ENCOUNTER — Other Ambulatory Visit (HOSPITAL_COMMUNITY): Payer: 59

## 2018-07-13 ENCOUNTER — Telehealth (HOSPITAL_COMMUNITY): Payer: Self-pay

## 2018-07-13 NOTE — Telephone Encounter (Signed)
The above patient or their representative was contacted and gave the following answers to these questions:         Do you have any of the following symptoms? No  Fever                    Cough                   Shortness of breath  Do  you have any of the following other symptoms?    muscle pain         vomiting,        diarrhea        rash         weakness        red eye        abdominal pain         bruising          bruising or bleeding              joint pain           severe headache    Have you been in contact with someone who was or has been sick in the past 2 weeks? No  Yes                 Unsure                         Unable to assess   Does the person that you were in contact with have any of the following symptoms?   Cough         shortness of breath           muscle pain         vomiting,            diarrhea            rash            weakness           fever            red eye           abdominal pain           bruising  or  bleeding                joint pain                severe headache               Have you  or someone you have been in contact with traveled internationally in th last month?  No        If yes, which countries?   Have you  or someone you have been in contact with traveled outside [place] in th last month? No         If yes, which state and city?   COMMENTS OR ACTION PLAN FOR THIS PATIENT:          

## 2018-07-15 ENCOUNTER — Other Ambulatory Visit: Payer: Self-pay

## 2018-07-15 ENCOUNTER — Ambulatory Visit (HOSPITAL_COMMUNITY)
Admission: RE | Admit: 2018-07-15 | Discharge: 2018-07-15 | Disposition: A | Discharge status: Home / Self Care | Payer: Medicare Other | Source: Ambulatory Visit | Attending: Family Medicine | Admitting: Family Medicine

## 2018-07-15 DIAGNOSIS — Z136 Encounter for screening for cardiovascular disorders: Secondary | ICD-10-CM | POA: Diagnosis not present

## 2018-07-23 ENCOUNTER — Other Ambulatory Visit: Payer: Self-pay | Admitting: Family Medicine

## 2018-07-23 DIAGNOSIS — I10 Essential (primary) hypertension: Secondary | ICD-10-CM

## 2018-07-24 ENCOUNTER — Other Ambulatory Visit: Payer: Self-pay | Admitting: Family Medicine

## 2018-07-24 MED ORDER — ZAFIRLUKAST 20 MG PO TABS: 20.0000 mg | ORAL_TABLET | Freq: Two times a day (BID) | ORAL | 3 refills | Status: DC

## 2018-09-28 ENCOUNTER — Telehealth: Payer: Self-pay | Admitting: Family Medicine

## 2018-09-28 ENCOUNTER — Other Ambulatory Visit: Payer: Self-pay | Admitting: Family Medicine

## 2018-09-28 MED ORDER — PREDNISONE 20 MG PO TABS: ORAL_TABLET | ORAL | 0 refills | Status: DC

## 2018-09-28 NOTE — Telephone Encounter (Signed)
Patient called in stating that he is having an allergic reaction to wood sap. States he has had a reaction in the past. He is currently experiencing facial swelling, redness, and itching. He denies any throat swelling, mouth swelling or difficulty breathing. He states that we have sent in prednisone for this in the past. Please advise?

## 2018-09-28 NOTE — Telephone Encounter (Signed)
Spoke with patient and informed him that medication will be sent into the pharmacy. Patient verbalized understanding.

## 2018-09-28 NOTE — Telephone Encounter (Signed)
I will send in prednisone.  °

## 2018-10-19 DIAGNOSIS — L570 Actinic keratosis: Secondary | ICD-10-CM | POA: Diagnosis not present

## 2018-12-02 ENCOUNTER — Other Ambulatory Visit: Payer: Self-pay | Admitting: Family Medicine

## 2018-12-07 DIAGNOSIS — H43813 Vitreous degeneration, bilateral: Secondary | ICD-10-CM | POA: Diagnosis not present

## 2018-12-07 DIAGNOSIS — H524 Presbyopia: Secondary | ICD-10-CM | POA: Diagnosis not present

## 2018-12-07 DIAGNOSIS — H04123 Dry eye syndrome of bilateral lacrimal glands: Secondary | ICD-10-CM | POA: Diagnosis not present

## 2018-12-07 DIAGNOSIS — H25013 Cortical age-related cataract, bilateral: Secondary | ICD-10-CM | POA: Diagnosis not present

## 2019-02-09 DIAGNOSIS — H2513 Age-related nuclear cataract, bilateral: Secondary | ICD-10-CM | POA: Diagnosis not present

## 2019-02-09 DIAGNOSIS — H43813 Vitreous degeneration, bilateral: Secondary | ICD-10-CM | POA: Diagnosis not present

## 2019-02-21 ENCOUNTER — Other Ambulatory Visit: Payer: Self-pay | Admitting: Family Medicine

## 2019-02-24 ENCOUNTER — Encounter: Payer: Self-pay | Admitting: Family Medicine

## 2019-03-24 DIAGNOSIS — L570 Actinic keratosis: Secondary | ICD-10-CM | POA: Diagnosis not present

## 2019-03-24 DIAGNOSIS — D2271 Melanocytic nevi of right lower limb, including hip: Secondary | ICD-10-CM | POA: Diagnosis not present

## 2019-03-24 DIAGNOSIS — D2272 Melanocytic nevi of left lower limb, including hip: Secondary | ICD-10-CM | POA: Diagnosis not present

## 2019-03-24 DIAGNOSIS — L821 Other seborrheic keratosis: Secondary | ICD-10-CM | POA: Diagnosis not present

## 2019-05-09 ENCOUNTER — Ambulatory Visit: Payer: Medicare Other | Attending: Internal Medicine

## 2019-05-09 DIAGNOSIS — Z23 Encounter for immunization: Secondary | ICD-10-CM | POA: Insufficient documentation

## 2019-05-09 NOTE — Progress Notes (Signed)
   Covid-19 Vaccination Clinic  Name:  NATANAEL BERRETTA    MRN: [id number] DOB: 14-Feb-1951  05/09/2019  Mr. Krieg was observed post Covid-19 immunization for 15 minutes without incident. He was provided with Vaccine Information Sheet and instruction to access the V-Safe system.   Mr. Fripp was instructed to call 911 with any severe reactions post vaccine: Marland Kitchen Difficulty breathing  . Swelling of face and throat  . A fast heartbeat  . A bad rash all over body  . Dizziness and weakness   Immunizations Administered    Name Date Dose VIS Date Route   Pfizer COVID-19 Vaccine 05/09/2019  9:13 AM 0.3 mL 02/12/2019 Intramuscular   Manufacturer: [place]   Lot: [id number]   [place]: [id number]

## 2019-05-28 ENCOUNTER — Other Ambulatory Visit: Payer: Medicare Other

## 2019-05-31 ENCOUNTER — Ambulatory Visit: Payer: Medicare Other | Attending: Internal Medicine

## 2019-05-31 ENCOUNTER — Encounter: Payer: Medicare Other | Admitting: Family Medicine

## 2019-05-31 DIAGNOSIS — Z23 Encounter for immunization: Secondary | ICD-10-CM

## 2019-05-31 NOTE — Progress Notes (Signed)
   Covid-19 Vaccination Clinic  Name:  [name]    MRN: [id number] DOB: [birth year]  05/31/2019  Mr. Breheny was observed post Covid-19 immunization for 15 minutes without incident. He was provided with Vaccine Information Sheet and instruction to access the V-Safe system.   Mr. Hayenga was instructed to call 911 with any severe reactions post vaccine: Marland Kitchen Difficulty breathing  . Swelling of face and throat  . A fast heartbeat  . A bad rash all over body  . Dizziness and weakness   Immunizations Administered    Name Date Dose VIS Date Route   Pfizer COVID-19 Vaccine 05/31/2019  8:19 AM 0.3 mL 02/12/2019 Intramuscular   Manufacturer: [place]   Lot: [id number]   [place]: [id number]

## 2019-06-15 ENCOUNTER — Other Ambulatory Visit: Payer: Medicare Other

## 2019-06-15 ENCOUNTER — Ambulatory Visit (INDEPENDENT_AMBULATORY_CARE_PROVIDER_SITE_OTHER): Payer: Medicare Other | Admitting: Family Medicine

## 2019-06-15 ENCOUNTER — Encounter: Payer: Self-pay | Admitting: Family Medicine

## 2019-06-15 ENCOUNTER — Other Ambulatory Visit: Payer: Self-pay

## 2019-06-15 VITALS — BP 126/64 | HR 70 | Temp 97.8°F | Resp 18 | Ht 71.5 in | Wt 196.0 lb

## 2019-06-15 DIAGNOSIS — Z0000 Encounter for general adult medical examination without abnormal findings: Secondary | ICD-10-CM

## 2019-06-15 DIAGNOSIS — E7800 Pure hypercholesterolemia, unspecified: Secondary | ICD-10-CM | POA: Diagnosis not present

## 2019-06-15 DIAGNOSIS — Z13220 Encounter for screening for lipoid disorders: Secondary | ICD-10-CM | POA: Diagnosis not present

## 2019-06-15 DIAGNOSIS — I10 Essential (primary) hypertension: Secondary | ICD-10-CM | POA: Diagnosis not present

## 2019-06-15 DIAGNOSIS — Z136 Encounter for screening for cardiovascular disorders: Secondary | ICD-10-CM | POA: Diagnosis not present

## 2019-06-15 DIAGNOSIS — Z0001 Encounter for general adult medical examination with abnormal findings: Secondary | ICD-10-CM | POA: Diagnosis not present

## 2019-06-15 DIAGNOSIS — Z125 Encounter for screening for malignant neoplasm of prostate: Secondary | ICD-10-CM | POA: Diagnosis not present

## 2019-06-15 DIAGNOSIS — R0989 Other specified symptoms and signs involving the circulatory and respiratory systems: Secondary | ICD-10-CM

## 2019-06-15 DIAGNOSIS — E785 Hyperlipidemia, unspecified: Secondary | ICD-10-CM | POA: Diagnosis not present

## 2019-06-15 LAB — CBC WITH DIFFERENTIAL/PLATELET
Absolute Monocytes: 654 cells/uL (ref 200–950)
Basophils Absolute: 78 cells/uL (ref 0–200)
Basophils Relative: 1.3 %
Eosinophils Absolute: 78 cells/uL (ref 15–500)
Eosinophils Relative: 1.3 %
HCT: 44.8 % (ref 38.5–50.0)
Hemoglobin: 14.7 g/dL (ref 13.2–17.1)
Lymphs Abs: 1422 cells/uL (ref 850–3900)
MCH: 31.8 pg (ref 27.0–33.0)
MCHC: 32.8 g/dL (ref 32.0–36.0)
MCV: 97 fL (ref 80.0–100.0)
MPV: 11 fL (ref 7.5–12.5)
Monocytes Relative: 10.9 %
Neutro Abs: 3768 cells/uL (ref 1500–7800)
Neutrophils Relative %: 62.8 %
Platelets: 244 10*3/uL (ref 140–400)
RBC: 4.62 10*6/uL (ref 4.20–5.80)
RDW: 12.6 % (ref 11.0–15.0)
Total Lymphocyte: 23.7 %
WBC: 6 10*3/uL (ref 3.8–10.8)

## 2019-06-15 LAB — LIPID PANEL
Cholesterol: 128 mg/dL (ref <200)
HDL: 43 mg/dL (ref >=40)
LDL Cholesterol (Calc): 71 mg/dL (calc)
Non-HDL Cholesterol (Calc): 85 mg/dL (calc) (ref <130)
Total CHOL/HDL Ratio: 3 (calc) (ref <5.0)
Triglycerides: 55 mg/dL (ref <150)

## 2019-06-15 LAB — COMPREHENSIVE METABOLIC PANEL
AG Ratio: 1.8 (calc) (ref 1.0–2.5)
ALT: 19 U/L (ref 9–46)
AST: 21 U/L (ref 10–35)
Albumin: 3.9 g/dL (ref 3.6–5.1)
Alkaline phosphatase (APISO): 56 U/L (ref 35–144)
BUN: 15 mg/dL (ref 7–25)
CO2: 33 mmol/L — ABNORMAL HIGH (ref 20–32)
Calcium: 9.2 mg/dL (ref 8.6–10.3)
Chloride: 109 mmol/L (ref 98–110)
Creat: 0.94 mg/dL (ref 0.70–1.25)
Globulin: 2.2 g/dL (calc) (ref 1.9–3.7)
Glucose, Bld: 103 mg/dL — ABNORMAL HIGH (ref 65–99)
Potassium: 3.2 mmol/L — ABNORMAL LOW (ref 3.5–5.3)
Sodium: 147 mmol/L — ABNORMAL HIGH (ref 135–146)
Total Bilirubin: 0.5 mg/dL (ref 0.2–1.2)
Total Protein: 6.1 g/dL (ref 6.1–8.1)

## 2019-06-15 LAB — PSA: PSA: 1.3 ng/mL (ref <=4.0)

## 2019-06-15 NOTE — Progress Notes (Signed)
Subjective:    Patient ID: [name], male    DOB: 18-Apr-1950, 69 y.o.   MRN: [id number]  HPI  Patient is here today for complete physical exam. His last colonoscopy was in 2020.  Due to polyps, repeat colonoscopy was recommended in 3 years (2023).  He is due for prostate cancer screening today.  Immunizations including COVID are up to date.  Immunization records are included below.  He denies any falls, symptoms of depression, or memory loss.  He denies any difficulties with performing his iADL's.  Patient had a negative AAA screen last year.  Otherwise doing well.  Immunization History  Administered Date(s) Administered  . Influenza Split 12/16/2012  . Influenza, High Dose Seasonal PF 11/25/2018  . Influenza,inj,Quad PF,6+ Mos 12/27/2015, 12/11/2016  . Influenza,trivalent, recombinat, inj, PF 10/21/2017  . Influenza-Unspecified 01/02/2014  . PFIZER SARS-COV-2 Vaccination 05/09/2019, 05/31/2019  . Pneumococcal Conjugate-13 06/11/2017  . Pneumococcal Polysaccharide-23 05/26/2018  . Tdap 01/25/2011  . Zoster 01/03/2014  . Zoster Recombinat (Shingrix) 07/16/2017, 10/21/2017    Past Medical History:  Diagnosis Date  . Allergy    wood sap  . Allergy history, seafood    oysters, mussels  . Arthritis    neck  . Bee sting allergy 1980   hives after 20 yellowjacket stings  . Cancer ([place])    "skin cancer frozen off"  . Colon polyps    q 3 years, last colonoscopy 03/12/18  . GERD (gastroesophageal reflux disease)   . Hypercholesteremia   . Hypertension    Past Surgical History:  Procedure Laterality Date  . CARPAL TUNNEL RELEASE Right 1985   elbow  . SHOULDER ARTHROSCOPY WITH SUBACROMIAL DECOMPRESSION AND OPEN ROTATOR C Right 10/22/2012   Procedure: RIGHT SHOULDER ARTHROSCOPY WITH SUBACROMIAL DECOMPRESSION AND  ROTATOR CUFF REPAIR,  CORACOACROMIAL LIGAMENT RELEASE AND  MANIPULATION UNDER ANESTHESIA;  Surgeon: Johnn Hai, MD;  Location: [facility];  Service: Orthopedics;   Laterality: Right;   Current Outpatient Medications on File Prior to Visit  Medication Sig Dispense Refill  . amLODipine (NORVASC) 10 MG tablet TAKE 1 TABLET BY MOUTH  DAILY 120 tablet 3  . cyclobenzaprine (FLEXERIL) 10 MG tablet Take 1 tablet (10 mg total) by mouth 3 (three) times daily as needed for muscle spasms. 30 tablet 0  . doxazosin (CARDURA) 4 MG tablet TAKE 1 TABLET BY MOUTH  DAILY 120 tablet 3  . hydrochlorothiazide (HYDRODIURIL) 25 MG tablet TAKE 1 TABLET BY MOUTH  DAILY 120 tablet 3  . levocetirizine (XYZAL) 5 MG tablet TAKE 1 TABLET BY MOUTH  EVERY EVENING 120 tablet 3  . losartan (COZAAR) 100 MG tablet TAKE 1 TABLET BY MOUTH  EVERY MORNING 120 tablet 3  . mometasone (ELOCON) 0.1 % cream Apply 1 application topically daily. 45 g 11  . pantoprazole (PROTONIX) 40 MG tablet TAKE 1 TABLET BY MOUTH  DAILY 120 tablet 3  . rosuvastatin (CRESTOR) 10 MG tablet TAKE 1 TABLET BY MOUTH  DAILY 120 tablet 2  . zafirlukast (ACCOLATE) 20 MG tablet TAKE 1 TABLET BY MOUTH TWICE A DAY BEFORE A MEAL AS NEEDED 180 tablet 3   No current facility-administered medications on file prior to visit.    Allergies  Allergen Reactions  . Caffeine Hives  . Iodine     Topical antiseptic-as a young adult. Don't know reaction  . Other     Wood Sap causes Hives   Social History   Socioeconomic History  . Marital status: Married  Spouse name: Not on file  . Number of children: Not on file  . Years of education: Not on file  . Highest education level: Not on file  Occupational History  . Not on file  Tobacco Use  . Smoking status: Former Smoker    Packs/day: 1.00    Years: 15.00    Pack years: 15.00    Types: Cigarettes    Quit date: 03/04/1984    Years since quitting: 35.3  . Smokeless tobacco: Former Systems developer    Types: [place] date: 03/04/2008  Substance and Sexual Activity  . Alcohol use: No  . Drug use: No  . Sexual activity: Not on file  Other Topics Concern  . Not on file  Social  History Narrative  . Not on file   Social Determinants of Health   Financial Resource Strain:   . Difficulty of Paying Living Expenses:   Food Insecurity:   . Worried About Charity fundraiser in the Last Year:   . Arboriculturist in the Last Year:   Transportation Needs:   . Film/video editor (Medical):   Marland Kitchen Lack of Transportation (Non-Medical):   Physical Activity:   . Days of Exercise per Week:   . Minutes of Exercise per Session:   Stress:   . Feeling of Stress :   Social Connections:   . Frequency of Communication with Friends and Family:   . Frequency of Social Gatherings with Friends and Family:   . Attends Religious Services:   . Active Member of Clubs or Organizations:   . Attends Archivist Meetings:   Marland Kitchen Marital Status:   Intimate Partner Violence:   . Fear of Current or Ex-Partner:   . Emotionally Abused:   Marland Kitchen Physically Abused:   . Sexually Abused:    Family History  Problem Relation Age of Onset  . Heart failure Mother   . Heart failure Father      Review of Systems  All other systems reviewed and are negative.      Objective:   Physical Exam  Constitutional: He is oriented to person, place, and time. He appears well-developed and well-nourished. No distress.  HENT:  Head: Normocephalic and atraumatic.  Right Ear: External ear normal.  Left Ear: External ear normal.  Nose: Nose normal.  Mouth/Throat: Oropharynx is clear and moist. No oropharyngeal exudate.  Eyes: Pupils are equal, round, and reactive to light. Conjunctivae and EOM are normal. Right eye exhibits no discharge. Left eye exhibits no discharge. No scleral icterus.  Neck: No JVD present. No tracheal deviation present. No thyromegaly present.  Cardiovascular: Normal rate, regular rhythm, normal heart sounds and intact distal pulses. Exam reveals no gallop and no friction rub.  No murmur heard. Pulses:      Carotid pulses are on the right side with bruit. Pulmonary/Chest:  Effort normal and breath sounds normal. No stridor. No respiratory distress. He has no wheezes. He has no rales. He exhibits no tenderness.  Abdominal: Soft. Bowel sounds are normal. He exhibits no distension and no mass. There is no abdominal tenderness. There is no rebound and no guarding.  Musculoskeletal:        General: No tenderness or edema. Normal range of motion.     Cervical back: Normal range of motion and neck supple.  Lymphadenopathy:    He has no cervical adenopathy.  Neurological: He is alert and oriented to person, place, and time. He has normal reflexes.  No cranial nerve deficit. He exhibits normal muscle tone. Coordination normal.  Skin: Skin is warm. No rash noted. He is not diaphoretic. No erythema. No pallor.  Psychiatric: He has a normal mood and affect. His behavior is normal. Judgment and thought content normal.  Vitals reviewed.         Assessment & Plan:  Right carotid bruit - Plan: US Carotid Duplex Right  Annual physical exam  Benign essential HTN  Pure hypercholesterolemia  Patient's physical exam today is completely normal except for right-sided carotid bruit.  He has no symptoms of falls, depression, or memory loss.  His AAA screening is normal.  Colonoscopy is up-to-date.  Check PSA today.  Also check CBC, CMP, fasting lipid panel.  Congratulated the patient on maintaining his weight loss.  Schedule the patient for carotid Dopplers to evaluate the cause of the right carotid bruit.  Immunizations are up-to-date.  Regular anticipatory guidance is provided.  Blood pressure is outstanding.

## 2019-06-17 ENCOUNTER — Other Ambulatory Visit: Payer: Self-pay | Admitting: Family Medicine

## 2019-06-17 DIAGNOSIS — R799 Abnormal finding of blood chemistry, unspecified: Secondary | ICD-10-CM

## 2019-06-21 ENCOUNTER — Other Ambulatory Visit: Payer: Self-pay

## 2019-06-21 ENCOUNTER — Other Ambulatory Visit: Payer: Medicare Other

## 2019-06-21 DIAGNOSIS — R799 Abnormal finding of blood chemistry, unspecified: Secondary | ICD-10-CM | POA: Diagnosis not present

## 2019-06-22 ENCOUNTER — Other Ambulatory Visit: Payer: Self-pay | Admitting: Family Medicine

## 2019-06-22 DIAGNOSIS — L239 Allergic contact dermatitis, unspecified cause: Secondary | ICD-10-CM

## 2019-06-22 LAB — BASIC METABOLIC PANEL
BUN: 17 mg/dL (ref 7–25)
CO2: 32 mmol/L (ref 20–32)
Calcium: 8.7 mg/dL (ref 8.6–10.3)
Chloride: 102 mmol/L (ref 98–110)
Creat: 0.82 mg/dL (ref 0.70–1.25)
Glucose, Bld: 99 mg/dL (ref 65–99)
Potassium: 2.6 mmol/L — CL (ref 3.5–5.3)
Sodium: 142 mmol/L (ref 135–146)

## 2019-06-22 MED ORDER — POTASSIUM CHLORIDE CRYS ER 20 MEQ PO TBCR: 20.0000 meq | EXTENDED_RELEASE_TABLET | Freq: Every day | ORAL | 0 refills | Status: DC

## 2019-06-28 ENCOUNTER — Other Ambulatory Visit: Payer: Medicare Other

## 2019-06-28 ENCOUNTER — Other Ambulatory Visit: Payer: Self-pay

## 2019-06-28 DIAGNOSIS — E876 Hypokalemia: Secondary | ICD-10-CM

## 2019-06-28 LAB — BASIC METABOLIC PANEL
BUN: 12 mg/dL (ref 7–25)
CO2: 28 mmol/L (ref 20–32)
Calcium: 8.9 mg/dL (ref 8.6–10.3)
Chloride: 108 mmol/L (ref 98–110)
Creat: 0.8 mg/dL (ref 0.70–1.25)
Glucose, Bld: 98 mg/dL (ref 65–99)
Potassium: 3.3 mmol/L — ABNORMAL LOW (ref 3.5–5.3)
Sodium: 145 mmol/L (ref 135–146)

## 2019-06-29 ENCOUNTER — Other Ambulatory Visit: Payer: Self-pay | Admitting: Family Medicine

## 2019-06-29 MED ORDER — POTASSIUM CHLORIDE CRYS ER 20 MEQ PO TBCR: 20.0000 meq | EXTENDED_RELEASE_TABLET | Freq: Every day | ORAL | 2 refills | Status: DC

## 2019-06-30 ENCOUNTER — Encounter (HOSPITAL_COMMUNITY): Payer: Self-pay

## 2019-06-30 ENCOUNTER — Ambulatory Visit (HOSPITAL_COMMUNITY)
Admission: EM | Admit: 2019-06-30 | Discharge: 2019-06-30 | Disposition: A | Discharge status: Home / Self Care | Payer: Medicare Other | Attending: Family Medicine | Admitting: Family Medicine

## 2019-06-30 ENCOUNTER — Other Ambulatory Visit: Payer: Self-pay

## 2019-06-30 DIAGNOSIS — M79631 Pain in right forearm: Secondary | ICD-10-CM | POA: Diagnosis not present

## 2019-06-30 DIAGNOSIS — S51811A Laceration without foreign body of right forearm, initial encounter: Secondary | ICD-10-CM

## 2019-06-30 MED ORDER — LIDOCAINE-EPINEPHRINE (PF) 2 %-1:200000 IJ SOLN
INTRAMUSCULAR | Status: AC
  Filled 2019-06-30: qty 20

## 2019-06-30 NOTE — ED Triage Notes (Signed)
Pt states he scrape his forearm on concrete and pt states he has a flap of skin ripped open, this happened about 2 hours ago. Pt states he was trying to help a friend move a piece of concrete and somehow he was falling and scraped his forearm on the concrete.

## 2019-06-30 NOTE — ED Provider Notes (Signed)
[place]   [id number] 06/30/19 Arrival Time: [id number]  CC: LACERATION  SUBJECTIVE:  NGOC MALLA is a 69 y.o. male who presents with a laceration that occurred today.  Symptoms began after he scrubbed his forearm with a concrete.  Bleeding controlled.  Currently not on blood thinners.  Denies similar symptoms in the past.  Denies fever, chills, nausea, vomiting, redness, swelling, purulent drainage, decrease strength or sensation.   Td UTD: Yes.  ROS: As per HPI.  All other pertinent ROS negative.     Past Medical History:  Diagnosis Date  . Allergy    wood sap  . Allergy history, seafood    oysters, mussels  . Arthritis    neck  . Bee sting allergy 1980   hives after 20 yellowjacket stings  . Cancer ([place])    "skin cancer frozen off"  . Colon polyps    q 3 years, last colonoscopy 03/12/18  . GERD (gastroesophageal reflux disease)   . Hypercholesteremia   . Hypertension    Past Surgical History:  Procedure Laterality Date  . CARPAL TUNNEL RELEASE Right 1985   elbow  . SHOULDER ARTHROSCOPY WITH SUBACROMIAL DECOMPRESSION AND OPEN ROTATOR C Right 10/22/2012   Procedure: RIGHT SHOULDER ARTHROSCOPY WITH SUBACROMIAL DECOMPRESSION AND  ROTATOR CUFF REPAIR,  CORACOACROMIAL LIGAMENT RELEASE AND  MANIPULATION UNDER ANESTHESIA;  Surgeon: Johnn Hai, MD;  Location: [facility];  Service: Orthopedics;  Laterality: Right;   Allergies  Allergen Reactions  . Caffeine Hives  . Iodine     Topical antiseptic-as a young adult. Don't know reaction  . Other     Wood Sap causes Hives   No current facility-administered medications on file prior to encounter.   Current Outpatient Medications on File Prior to Encounter  Medication Sig Dispense Refill  . amLODipine (NORVASC) 10 MG tablet TAKE 1 TABLET BY MOUTH  DAILY 120 tablet 3  . cyclobenzaprine (FLEXERIL) 10 MG tablet Take 1 tablet (10 mg total) by mouth 3 (three) times daily as needed for muscle spasms. 30 tablet 0  .  doxazosin (CARDURA) 4 MG tablet TAKE 1 TABLET BY MOUTH  DAILY 120 tablet 3  . hydrochlorothiazide (HYDRODIURIL) 25 MG tablet TAKE 1 TABLET BY MOUTH  DAILY 120 tablet 3  . levocetirizine (XYZAL) 5 MG tablet TAKE 1 TABLET BY MOUTH  EVERY EVENING 120 tablet 3  . losartan (COZAAR) 100 MG tablet TAKE 1 TABLET BY MOUTH  EVERY MORNING 120 tablet 3  . mometasone (ELOCON) 0.1 % cream APPLY 1 APPLICATION TOPICALLY DAILY. 45 g 11  . pantoprazole (PROTONIX) 40 MG tablet TAKE 1 TABLET BY MOUTH  DAILY 120 tablet 3  . potassium chloride SA (KLOR-CON) 20 MEQ tablet Take 1 tablet (20 mEq total) by mouth daily. 30 tablet 2  . rosuvastatin (CRESTOR) 10 MG tablet TAKE 1 TABLET BY MOUTH  DAILY 120 tablet 2  . zafirlukast (ACCOLATE) 20 MG tablet TAKE 1 TABLET BY MOUTH TWICE A DAY BEFORE A MEAL AS NEEDED 180 tablet 3   Social History   Socioeconomic History  . Marital status: Married    Spouse name: Not on file  . Number of children: Not on file  . Years of education: Not on file  . Highest education level: Not on file  Occupational History  . Not on file  Tobacco Use  . Smoking status: Former Smoker    Packs/day: 1.00    Years: 15.00    Pack years: 15.00    Types: Cigarettes  Quit date: 03/04/1984    Years since quitting: 35.3  . Smokeless tobacco: Former Systems developer    Types: [place] date: 03/04/2008  Substance and Sexual Activity  . Alcohol use: No  . Drug use: No  . Sexual activity: Not on file  Other Topics Concern  . Not on file  Social History Narrative  . Not on file   Social Determinants of Health   Financial Resource Strain:   . Difficulty of Paying Living Expenses:   Food Insecurity:   . Worried About Charity fundraiser in the Last Year:   . Arboriculturist in the Last Year:   Transportation Needs:   . Film/video editor (Medical):   Marland Kitchen Lack of Transportation (Non-Medical):   Physical Activity:   . Days of Exercise per Week:   . Minutes of Exercise per Session:   Stress:   .  Feeling of Stress :   Social Connections:   . Frequency of Communication with Friends and Family:   . Frequency of Social Gatherings with Friends and Family:   . Attends Religious Services:   . Active Member of Clubs or Organizations:   . Attends Archivist Meetings:   Marland Kitchen Marital Status:   Intimate Partner Violence:   . Fear of Current or Ex-Partner:   . Emotionally Abused:   Marland Kitchen Physically Abused:   . Sexually Abused:    Family History  Problem Relation Age of Onset  . Heart failure Mother   . Heart failure Father      OBJECTIVE:  Vitals:   06/30/19 1454 06/30/19 1456  BP:  (!) 148/87  Pulse:  66  Resp:  18  Temp:  98.2 F (36.8 C)  TempSrc:  Oral  SpO2:  98%  Weight: 190 lb (86.2 kg)      General appearance: alert; no distress Skin: laceration of right forearm; size: approx 5 cm Psychological: alert and cooperative; normal mood and affect   Results for orders placed or performed in visit on [id number]  Basic Metabolic Panel  Result Value Ref Range   Glucose, Bld 98 65 - 99 mg/dL   BUN 12 7 - 25 mg/dL   Creat 0.80 0.70 - 1.25 mg/dL   BUN/Creatinine Ratio NOT APPLICABLE 6 - 22 (calc)   Sodium 145 135 - 146 mmol/L   Potassium 3.3 (L) 3.5 - 5.3 mmol/L   Chloride 108 98 - 110 mmol/L   CO2 28 20 - 32 mmol/L   Calcium 8.9 8.6 - 10.3 mg/dL    Labs Reviewed - No data to display  No results found.  Procedure: Verbal consent obtained. Patient provided with risks and alternatives to the procedure. Wound copiously irrigated with NS then cleansed with betadine. Anesthetized with 9 mL of lidocaine with epinephrine after LET. Wound carefully explored. No foreign body, tendon injury, or nonviable tissue were noted. Using sterile technique 7 interrupted using size 4 and 3 Ethilon Prolene sutures were placed to reapproximate the wound. Patient tolerated procedure well. No complications. Minimal bleeding. Patient advised to look for and return for any signs of infection  such as redness, swelling, discharge, or worsening pain. Return for suture removal in 10-14 days.  ASSESSMENT & PLAN:  1. Pain in right forearm   2. Laceration of right forearm, initial encounter     No orders of the defined types were placed in this encounter.   Bandage applied Keep covered for next and dry for next 24-48  hours.  After then you may gently clean with warm water and mild soap.  Avoid submerging wound in water. Change dressing daily and apply a thin layer of neosporin.  Return in 10 days to have sutures removed.   Take OTC ibuprofen or tylenol as needed for pain releif Return sooner or go to the ED if you have any new or worsening symptoms such as increased pain, redness, swelling, drainage, discharge, decreased range of motion of extremity, etc..     Reviewed expectations re: course of current medical issues. Questions answered. Outlined signs and symptoms indicating need for more acute intervention. Patient verbalized understanding. After Visit Summary given.   Emerson Monte, [place] 06/30/19 1653

## 2019-06-30 NOTE — Discharge Instructions (Signed)
Bandage applied Keep covered for next and dry for next 24 hours.  After then you may gently clean with warm water and mild soap.  Avoid submerging wound in water. Change dressing daily and apply a thin layer of neosporin.  Return in 10-14 days to have sutures removed.   Take OTC ibuprofen or tylenol as needed for pain releif Return sooner or go to the ED if you have any new or worsening symptoms such as increased pain, redness, swelling, drainage, discharge, decreased range of motion of extremity, etc..

## 2019-07-01 ENCOUNTER — Encounter: Payer: Self-pay | Admitting: Family Medicine

## 2019-07-01 ENCOUNTER — Ambulatory Visit (INDEPENDENT_AMBULATORY_CARE_PROVIDER_SITE_OTHER): Payer: Medicare Other | Admitting: Family Medicine

## 2019-07-01 VITALS — BP 130/72 | HR 96 | Temp 98.3°F | Resp 14 | Ht 71.5 in | Wt 200.0 lb

## 2019-07-01 DIAGNOSIS — S51811A Laceration without foreign body of right forearm, initial encounter: Secondary | ICD-10-CM

## 2019-07-01 DIAGNOSIS — S51811S Laceration without foreign body of right forearm, sequela: Secondary | ICD-10-CM

## 2019-07-01 NOTE — Progress Notes (Signed)
Subjective:    Patient ID: [name], male    DOB: [birth year], 69 y.o.   MRN: [id number]  HPI  Patient fell yesterday and suffered a laceration to the dorsum of his right forearm.  He has a triangular-shaped laceration.  It is closed with seven 4-0 Prolene sutures.  There is no erythema.  There is no fluctuance.  There is no warmth or pain.  The wound appears clean dry and intact.  The bandage was removed today and the wound was redressed with Silvadene, nonadherent gauze, and wrapped in Coban. Past Medical History:  Diagnosis Date  . Allergy    wood sap  . Allergy history, seafood    oysters, mussels  . Arthritis    neck  . Bee sting allergy 1980   hives after 20 yellowjacket stings  . Cancer ([place])    "skin cancer frozen off"  . Colon polyps    q 3 years, last colonoscopy 03/12/18  . GERD (gastroesophageal reflux disease)   . Hypercholesteremia   . Hypertension    Past Surgical History:  Procedure Laterality Date  . CARPAL TUNNEL RELEASE Right 1985   elbow  . SHOULDER ARTHROSCOPY WITH SUBACROMIAL DECOMPRESSION AND OPEN ROTATOR C Right 10/22/2012   Procedure: RIGHT SHOULDER ARTHROSCOPY WITH SUBACROMIAL DECOMPRESSION AND  ROTATOR CUFF REPAIR,  CORACOACROMIAL LIGAMENT RELEASE AND  MANIPULATION UNDER ANESTHESIA;  Surgeon: Johnn Hai, MD;  Location: [facility];  Service: Orthopedics;  Laterality: Right;   Current Outpatient Medications on File Prior to Visit  Medication Sig Dispense Refill  . amLODipine (NORVASC) 10 MG tablet TAKE 1 TABLET BY MOUTH  DAILY 120 tablet 3  . cyclobenzaprine (FLEXERIL) 10 MG tablet Take 1 tablet (10 mg total) by mouth 3 (three) times daily as needed for muscle spasms. 30 tablet 0  . doxazosin (CARDURA) 4 MG tablet TAKE 1 TABLET BY MOUTH  DAILY 120 tablet 3  . levocetirizine (XYZAL) 5 MG tablet TAKE 1 TABLET BY MOUTH  EVERY EVENING 120 tablet 3  . losartan (COZAAR) 100 MG tablet TAKE 1 TABLET BY MOUTH  EVERY MORNING 120 tablet 3  . mometasone  (ELOCON) 0.1 % cream APPLY 1 APPLICATION TOPICALLY DAILY. 45 g 11  . pantoprazole (PROTONIX) 40 MG tablet TAKE 1 TABLET BY MOUTH  DAILY 120 tablet 3  . potassium chloride SA (KLOR-CON) 20 MEQ tablet Take 1 tablet (20 mEq total) by mouth daily. 30 tablet 2  . rosuvastatin (CRESTOR) 10 MG tablet TAKE 1 TABLET BY MOUTH  DAILY 120 tablet 2  . zafirlukast (ACCOLATE) 20 MG tablet TAKE 1 TABLET BY MOUTH TWICE A DAY BEFORE A MEAL AS NEEDED 180 tablet 3   No current facility-administered medications on file prior to visit.   Allergies  Allergen Reactions  . Iodine     Topical antiseptic-as a young adult. Don't know reaction  . Other     Wood Sap causes Hives   Social History   Socioeconomic History  . Marital status: Married    Spouse name: Not on file  . Number of children: Not on file  . Years of education: Not on file  . Highest education level: Not on file  Occupational History  . Not on file  Tobacco Use  . Smoking status: Former Smoker    Packs/day: 1.00    Years: 15.00    Pack years: 15.00    Types: Cigarettes    Quit date: 03/04/1984    Years since quitting: 35.3  . Smokeless  tobacco: Former Systems developer    Types: Chew    Quit date: 03/04/2008  Substance and Sexual Activity  . Alcohol use: No  . Drug use: No  . Sexual activity: Not on file  Other Topics Concern  . Not on file  Social History Narrative  . Not on file   Social Determinants of Health   Financial Resource Strain:   . Difficulty of Paying Living Expenses:   Food Insecurity:   . Worried About Charity fundraiser in the Last Year:   . Arboriculturist in the Last Year:   Transportation Needs:   . Film/video editor (Medical):   Marland Kitchen Lack of Transportation (Non-Medical):   Physical Activity:   . Days of Exercise per Week:   . Minutes of Exercise per Session:   Stress:   . Feeling of Stress :   Social Connections:   . Frequency of Communication with Friends and Family:   . Frequency of Social Gatherings with  Friends and Family:   . Attends Religious Services:   . Active Member of Clubs or Organizations:   . Attends Archivist Meetings:   Marland Kitchen Marital Status:   Intimate Partner Violence:   . Fear of Current or Ex-Partner:   . Emotionally Abused:   Marland Kitchen Physically Abused:   . Sexually Abused:      Review of Systems  All other systems reviewed and are negative.      Objective:   Physical Exam Vitals reviewed.  Constitutional:      Appearance: He is well-developed.  Cardiovascular:     Rate and Rhythm: Normal rate and regular rhythm.  Pulmonary:     Effort: Pulmonary effort is normal.     Breath sounds: Normal breath sounds.  Musculoskeletal:       Arms:           Assessment & Plan:  Forearm laceration, right, sequela  No evidence of cellulitis.  The wound was dressed with Silvadene, nonadherent gauze, and Coban.  Wound care was discussed.  Recheck in 1 week to remove sutures.

## 2019-07-02 ENCOUNTER — Ambulatory Visit: Payer: Medicare Other | Admitting: Family Medicine

## 2019-07-12 ENCOUNTER — Encounter: Payer: Self-pay | Admitting: Family Medicine

## 2019-07-12 ENCOUNTER — Other Ambulatory Visit: Payer: Self-pay

## 2019-07-12 ENCOUNTER — Ambulatory Visit (INDEPENDENT_AMBULATORY_CARE_PROVIDER_SITE_OTHER): Payer: Medicare Other | Admitting: Family Medicine

## 2019-07-12 VITALS — BP 140/70 | HR 76 | Temp 97.4°F | Resp 16 | Ht 71.5 in | Wt 200.0 lb

## 2019-07-12 DIAGNOSIS — Z4802 Encounter for removal of sutures: Secondary | ICD-10-CM

## 2019-07-12 DIAGNOSIS — S51811D Laceration without foreign body of right forearm, subsequent encounter: Secondary | ICD-10-CM | POA: Diagnosis not present

## 2019-07-12 DIAGNOSIS — S51811S Laceration without foreign body of right forearm, sequela: Secondary | ICD-10-CM

## 2019-07-12 NOTE — Progress Notes (Signed)
Subjective:    Patient ID: [name], male    DOB: [birth year], 69 y.o.   MRN: [id number]  HPI 07/01/19 Patient fell yesterday and suffered a laceration to the dorsum of his right forearm.  He has a triangular-shaped laceration.  It is closed with seven 4-0 Prolene sutures.  There is no erythema.  There is no fluctuance.  There is no warmth or pain.  The wound appears clean dry and intact.  The bandage was removed today and the wound was redressed with Silvadene, nonadherent gauze, and wrapped in Coban.  At that time, my plan was: No evidence of cellulitis.  The wound was dressed with Silvadene, nonadherent gauze, and Coban.  Wound care was discussed.  Recheck in 1 week to remove sutures.  07/12/19 Here for suture removal.  Appears well healed without evidence of secondary infection. No erythema or warmth or pain.   Past Medical History:  Diagnosis Date  . Allergy    wood sap  . Allergy history, seafood    oysters, mussels  . Arthritis    neck  . Bee sting allergy 1980   hives after 20 yellowjacket stings  . Cancer ([place])    "skin cancer frozen off"  . Colon polyps    q 3 years, last colonoscopy 03/12/18  . GERD (gastroesophageal reflux disease)   . Hypercholesteremia   . Hypertension    Past Surgical History:  Procedure Laterality Date  . CARPAL TUNNEL RELEASE Right 1985   elbow  . SHOULDER ARTHROSCOPY WITH SUBACROMIAL DECOMPRESSION AND OPEN ROTATOR C Right 10/22/2012   Procedure: RIGHT SHOULDER ARTHROSCOPY WITH SUBACROMIAL DECOMPRESSION AND  ROTATOR CUFF REPAIR,  CORACOACROMIAL LIGAMENT RELEASE AND  MANIPULATION UNDER ANESTHESIA;  Surgeon: Johnn Hai, MD;  Location: [facility];  Service: Orthopedics;  Laterality: Right;   Current Outpatient Medications on File Prior to Visit  Medication Sig Dispense Refill  . amLODipine (NORVASC) 10 MG tablet TAKE 1 TABLET BY MOUTH  DAILY 120 tablet 3  . cyclobenzaprine (FLEXERIL) 10 MG tablet Take 1 tablet (10 mg total) by mouth 3  (three) times daily as needed for muscle spasms. 30 tablet 0  . doxazosin (CARDURA) 4 MG tablet TAKE 1 TABLET BY MOUTH  DAILY 120 tablet 3  . levocetirizine (XYZAL) 5 MG tablet TAKE 1 TABLET BY MOUTH  EVERY EVENING 120 tablet 3  . losartan (COZAAR) 100 MG tablet TAKE 1 TABLET BY MOUTH  EVERY MORNING 120 tablet 3  . mometasone (ELOCON) 0.1 % cream APPLY 1 APPLICATION TOPICALLY DAILY. 45 g 11  . pantoprazole (PROTONIX) 40 MG tablet TAKE 1 TABLET BY MOUTH  DAILY 120 tablet 3  . potassium chloride SA (KLOR-CON) 20 MEQ tablet Take 1 tablet (20 mEq total) by mouth daily. 30 tablet 2  . rosuvastatin (CRESTOR) 10 MG tablet TAKE 1 TABLET BY MOUTH  DAILY 120 tablet 2  . zafirlukast (ACCOLATE) 20 MG tablet TAKE 1 TABLET BY MOUTH TWICE A DAY BEFORE A MEAL AS NEEDED 180 tablet 3   No current facility-administered medications on file prior to visit.   Allergies  Allergen Reactions  . Iodine     Topical antiseptic-as a young adult. Don't know reaction  . Other     Wood Sap causes Hives   Social History   Socioeconomic History  . Marital status: Married    Spouse name: Not on file  . Number of children: Not on file  . Years of education: Not on file  . Highest education  level: Not on file  Occupational History  . Not on file  Tobacco Use  . Smoking status: Former Smoker    Packs/day: 1.00    Years: 15.00    Pack years: 15.00    Types: Cigarettes    Quit date: 03/04/1984    Years since quitting: 35.3  . Smokeless tobacco: Former Systems developer    Types: [place] date: 03/04/2008  Substance and Sexual Activity  . Alcohol use: No  . Drug use: No  . Sexual activity: Not on file  Other Topics Concern  . Not on file  Social History Narrative  . Not on file   Social Determinants of Health   Financial Resource Strain:   . Difficulty of Paying Living Expenses:   Food Insecurity:   . Worried About Charity fundraiser in the Last Year:   . Arboriculturist in the Last Year:   Transportation Needs:    . Film/video editor (Medical):   Marland Kitchen Lack of Transportation (Non-Medical):   Physical Activity:   . Days of Exercise per Week:   . Minutes of Exercise per Session:   Stress:   . Feeling of Stress :   Social Connections:   . Frequency of Communication with Friends and Family:   . Frequency of Social Gatherings with Friends and Family:   . Attends Religious Services:   . Active Member of Clubs or Organizations:   . Attends Archivist Meetings:   Marland Kitchen Marital Status:   Intimate Partner Violence:   . Fear of Current or Ex-Partner:   . Emotionally Abused:   Marland Kitchen Physically Abused:   . Sexually Abused:      Review of Systems  All other systems reviewed and are negative.      Objective:   Physical Exam Vitals reviewed.  Constitutional:      Appearance: He is well-developed.  Cardiovascular:     Rate and Rhythm: Normal rate and regular rhythm.  Pulmonary:     Effort: Pulmonary effort is normal.     Breath sounds: Normal breath sounds.  Musculoskeletal:       Arms:           Assessment & Plan:  Forearm laceration, right, sequela  Visit for suture removal  Seven sutures removed without difficulty.

## 2019-07-16 ENCOUNTER — Other Ambulatory Visit: Payer: Self-pay | Admitting: Family Medicine

## 2019-07-22 ENCOUNTER — Other Ambulatory Visit: Payer: Self-pay | Admitting: Family Medicine

## 2019-07-22 MED ORDER — ZAFIRLUKAST 20 MG PO TABS: ORAL_TABLET | ORAL | 2 refills | Status: DC

## 2019-07-24 ENCOUNTER — Encounter: Payer: Self-pay | Admitting: Family Medicine

## 2019-07-27 ENCOUNTER — Other Ambulatory Visit: Payer: Self-pay | Admitting: Family Medicine

## 2019-07-27 DIAGNOSIS — R0989 Other specified symptoms and signs involving the circulatory and respiratory systems: Secondary | ICD-10-CM

## 2019-08-04 ENCOUNTER — Other Ambulatory Visit: Payer: Self-pay

## 2019-08-04 ENCOUNTER — Ambulatory Visit (HOSPITAL_COMMUNITY)
Admission: RE | Admit: 2019-08-04 | Discharge: 2019-08-04 | Disposition: A | Discharge status: Home / Self Care | Payer: Medicare Other | Source: Ambulatory Visit | Attending: Family Medicine | Admitting: Family Medicine

## 2019-08-04 DIAGNOSIS — R0989 Other specified symptoms and signs involving the circulatory and respiratory systems: Secondary | ICD-10-CM | POA: Insufficient documentation

## 2019-08-05 ENCOUNTER — Encounter: Payer: Self-pay | Admitting: Family Medicine

## 2019-09-25 ENCOUNTER — Other Ambulatory Visit: Payer: Self-pay | Admitting: Family Medicine

## 2019-09-25 DIAGNOSIS — I10 Essential (primary) hypertension: Secondary | ICD-10-CM

## 2019-10-05 ENCOUNTER — Ambulatory Visit (INDEPENDENT_AMBULATORY_CARE_PROVIDER_SITE_OTHER): Payer: Medicare Other | Admitting: Family Medicine

## 2019-10-05 ENCOUNTER — Other Ambulatory Visit: Payer: Self-pay

## 2019-10-05 VITALS — BP 130/70 | HR 70 | Temp 98.2°F | Ht 71.0 in | Wt 204.0 lb

## 2019-10-05 DIAGNOSIS — M67442 Ganglion, left hand: Secondary | ICD-10-CM | POA: Diagnosis not present

## 2019-10-05 DIAGNOSIS — E876 Hypokalemia: Secondary | ICD-10-CM

## 2019-10-05 NOTE — Progress Notes (Signed)
Subjective:    Patient ID: [name], male    DOB: 02/16/1951, 69 y.o.   MRN: [id number]  HPI Patient has a painless cystlike lesion growing at the base of his left thumb just adjacent to the proximal nail fold.  The fingernail distal to the lesion has a longitudinal depression.  This appears to be due to pressure on the germinal matrix.  The lesion is approximately 3 mm to 4 mm in diameter.  It appears to be a mucus-like cyst.  It is soft.  It is nontender.  There is no erythema. On recent lab work, the patient was found to have hypokalemia.  At that time we discontinued his hydrochlorothiazide and started him on potassium.  He is due to recheck his potassium Past Medical History:  Diagnosis Date  . Allergy    wood sap  . Allergy history, seafood    oysters, mussels  . Arthritis    neck  . Bee sting allergy 1980   hives after 20 yellowjacket stings  . Cancer ([place])    "skin cancer frozen off"  . Colon polyps    q 3 years, last colonoscopy 03/12/18  . GERD (gastroesophageal reflux disease)   . Hypercholesteremia   . Hypertension    Past Surgical History:  Procedure Laterality Date  . CARPAL TUNNEL RELEASE Right 1985   elbow  . SHOULDER ARTHROSCOPY WITH SUBACROMIAL DECOMPRESSION AND OPEN ROTATOR C Right 10/22/2012   Procedure: RIGHT SHOULDER ARTHROSCOPY WITH SUBACROMIAL DECOMPRESSION AND  ROTATOR CUFF REPAIR,  CORACOACROMIAL LIGAMENT RELEASE AND  MANIPULATION UNDER ANESTHESIA;  Surgeon: Johnn Hai, MD;  Location: [facility];  Service: Orthopedics;  Laterality: Right;   Current Outpatient Medications on File Prior to Visit  Medication Sig Dispense Refill  . amLODipine (NORVASC) 10 MG tablet TAKE 1 TABLET BY MOUTH  DAILY 120 tablet 2  . cyclobenzaprine (FLEXERIL) 10 MG tablet Take 1 tablet (10 mg total) by mouth 3 (three) times daily as needed for muscle spasms. 30 tablet 0  . doxazosin (CARDURA) 4 MG tablet TAKE 1 TABLET BY MOUTH  DAILY 120 tablet 2  . levocetirizine  (XYZAL) 5 MG tablet TAKE 1 TABLET BY MOUTH  EVERY EVENING 120 tablet 3  . losartan (COZAAR) 100 MG tablet TAKE 1 TABLET BY MOUTH IN  THE MORNING 120 tablet 2  . mometasone (ELOCON) 0.1 % cream APPLY 1 APPLICATION TOPICALLY DAILY. 45 g 11  . pantoprazole (PROTONIX) 40 MG tablet TAKE 1 TABLET BY MOUTH  DAILY 120 tablet 2  . potassium chloride SA (KLOR-CON) 20 MEQ tablet Take 1 tablet (20 mEq total) by mouth daily. 30 tablet 2  . rosuvastatin (CRESTOR) 10 MG tablet TAKE 1 TABLET BY MOUTH  DAILY 120 tablet 2  . zafirlukast (ACCOLATE) 20 MG tablet TAKE 1 TABLET BY MOUTH 2  TIMES DAILY BEFORE A MEAL. 240 tablet 2   No current facility-administered medications on file prior to visit.   Allergies  Allergen Reactions  . Iodine     Topical antiseptic-as a young adult. Don't know reaction  . Other     Wood Sap causes Hives   Social History   Socioeconomic History  . Marital status: Married    Spouse name: Not on file  . Number of children: Not on file  . Years of education: Not on file  . Highest education level: Not on file  Occupational History  . Not on file  Tobacco Use  . Smoking status: Former Smoker  Packs/day: 1.00    Years: 15.00    Pack years: 15.00    Types: Cigarettes    Quit date: 03/04/1984    Years since quitting: 35.6  . Smokeless tobacco: Former Systems developer    Types: [place] date: 03/04/2008  Substance and Sexual Activity  . Alcohol use: No  . Drug use: No  . Sexual activity: Not on file  Other Topics Concern  . Not on file  Social History Narrative  . Not on file   Social Determinants of Health   Financial Resource Strain:   . Difficulty of Paying Living Expenses:   Food Insecurity:   . Worried About Charity fundraiser in the Last Year:   . Arboriculturist in the Last Year:   Transportation Needs:   . Film/video editor (Medical):   Marland Kitchen Lack of Transportation (Non-Medical):   Physical Activity:   . Days of Exercise per Week:   . Minutes of Exercise per  Session:   Stress:   . Feeling of Stress :   Social Connections:   . Frequency of Communication with Friends and Family:   . Frequency of Social Gatherings with Friends and Family:   . Attends Religious Services:   . Active Member of Clubs or Organizations:   . Attends Archivist Meetings:   Marland Kitchen Marital Status:   Intimate Partner Violence:   . Fear of Current or Ex-Partner:   . Emotionally Abused:   Marland Kitchen Physically Abused:   . Sexually Abused:       Review of Systems  All other systems reviewed and are negative.      Objective:   Physical Exam Vitals reviewed.  Constitutional:      Appearance: Normal appearance.  Cardiovascular:     Rate and Rhythm: Normal rate and regular rhythm.     Heart sounds: Normal heart sounds.  Pulmonary:     Effort: Pulmonary effort is normal.     Breath sounds: Normal breath sounds.  Musculoskeletal:     Left hand: Deformity present. No tenderness or bony tenderness. Normal strength. Normal sensation.       Hands:  Neurological:     Mental Status: He is alert.           Assessment & Plan:  Hypokalemia - Plan: BASIC METABOLIC PANEL WITH GFR  Digital mucinous cyst of finger of left hand  Lesion appears to be a benign digital mucous cyst of his left thumb.  The lesion is a papule with well-circumscribed borders and a cystlike consistency.  The skin overlying it is so thin, I can almost appreciate the clear consistency of the fluid.  I explained to the patient that this lesion is most likely a benign lesion.  At the present time he declines surgical resection.  If it starts to grow or become painful or disfiguring I will be glad to send him to see hand specialist to have it removed.  It is affecting the underlying nail matrix.  I will recheck his potassium to monitor his hypokalemia.

## 2019-10-06 LAB — BASIC METABOLIC PANEL WITH GFR
BUN/Creatinine Ratio: 11 (calc) (ref 6–22)
BUN: 14 mg/dL (ref 7–25)
CO2: 28 mmol/L (ref 20–32)
Calcium: 9 mg/dL (ref 8.6–10.3)
Chloride: 105 mmol/L (ref 98–110)
Creat: 1.32 mg/dL — ABNORMAL HIGH (ref 0.70–1.25)
GFR, Est African American: 64 mL/min/{1.73_m2} (ref >=60)
GFR, Est Non African American: 55 mL/min/{1.73_m2} — ABNORMAL LOW (ref >=60)
Glucose, Bld: 94 mg/dL (ref 65–99)
Potassium: 3.6 mmol/L (ref 3.5–5.3)
Sodium: 140 mmol/L (ref 135–146)

## 2019-10-08 ENCOUNTER — Other Ambulatory Visit: Payer: Self-pay | Admitting: Family Medicine

## 2019-10-11 ENCOUNTER — Encounter: Payer: Self-pay | Admitting: Family Medicine

## 2019-10-11 DIAGNOSIS — N179 Acute kidney failure, unspecified: Secondary | ICD-10-CM

## 2019-10-11 MED ORDER — POTASSIUM CHLORIDE CRYS ER 20 MEQ PO TBCR: 20.0000 meq | EXTENDED_RELEASE_TABLET | Freq: Every day | ORAL | 2 refills | Status: DC

## 2019-10-22 ENCOUNTER — Other Ambulatory Visit: Payer: Self-pay

## 2019-10-22 ENCOUNTER — Other Ambulatory Visit: Payer: Medicare Other

## 2019-10-22 DIAGNOSIS — N179 Acute kidney failure, unspecified: Secondary | ICD-10-CM | POA: Diagnosis not present

## 2019-10-22 LAB — BASIC METABOLIC PANEL
BUN: 12 mg/dL (ref 7–25)
CO2: 27 mmol/L (ref 20–32)
Calcium: 8.9 mg/dL (ref 8.6–10.3)
Chloride: 104 mmol/L (ref 98–110)
Creat: 0.87 mg/dL (ref 0.70–1.25)
Glucose, Bld: 98 mg/dL (ref 65–99)
Potassium: 3.4 mmol/L — ABNORMAL LOW (ref 3.5–5.3)
Sodium: 141 mmol/L (ref 135–146)

## 2019-12-29 ENCOUNTER — Ambulatory Visit (INDEPENDENT_AMBULATORY_CARE_PROVIDER_SITE_OTHER): Payer: Medicare Other | Admitting: Family Medicine

## 2019-12-29 ENCOUNTER — Other Ambulatory Visit: Payer: Self-pay

## 2019-12-29 ENCOUNTER — Encounter: Payer: Self-pay | Admitting: Family Medicine

## 2019-12-29 VITALS — BP 160/80 | HR 90 | Temp 98.5°F | Ht 71.0 in | Wt 179.0 lb

## 2019-12-29 DIAGNOSIS — L255 Unspecified contact dermatitis due to plants, except food: Secondary | ICD-10-CM | POA: Diagnosis not present

## 2019-12-29 DIAGNOSIS — I10 Essential (primary) hypertension: Secondary | ICD-10-CM

## 2019-12-29 MED ORDER — PREDNISONE 10 MG PO TABS: ORAL_TABLET | ORAL | 0 refills | Status: DC

## 2019-12-29 MED ORDER — METHYLPREDNISOLONE ACETATE 40 MG/ML IJ SUSP
40.0000 mg | Freq: Once | INTRAMUSCULAR | Status: AC
  Administered 2019-12-29: 40 mg via INTRAMUSCULAR

## 2019-12-29 NOTE — Progress Notes (Signed)
° °  Subjective:    Patient ID: [name], male    DOB: April 10, 1950, 69 y.o.   MRN: [id number]  Patient presents for Poison Ivy Pt here with poison ivy and itchy rash started 2 days ago patient was out in his backyard also feels like he may been on the dog He had spot on left forearm and he was scracthing then it moved to right forarm, thennoticed on face yesterday with itching and mild swelling Itchy spot on his back and knees down.  It appears to be spreading. Not any difficulty breathing or swallowing has not used anything for the rash  Review Of Systems:  GEN- denies fatigue, fever, weight loss,weakness, recent illness HEENT- denies eye drainage, change in vision, nasal discharge, CVS- denies chest pain, palpitations RESP- denies SOB, cough, wheeze ABD- denies N/V, change in stools, abd pain GU- denies dysuria, hematuria, dribbling, incontinence MSK- denies joint pain, muscle aches, injury Neuro- denies headache, dizziness, syncope, seizure activity       Objective:    BP (!) 160/80 (BP Location: Right Arm, Patient Position: Sitting, Cuff Size: Normal)    Pulse 90    Temp 98.5 F (36.9 C) (Oral)    Ht 5\' 11"  (1.803 m)    Wt 179 lb (81.2 kg)    SpO2 98%    BMI 24.97 kg/m  GEN- NAD, alert and oriented x3 HEENT- PERRL, EOMI, non injected sclera, pink conjunctiva, MMM, oropharynx clear Neck- Supple,No LAD  CVS- RRR, no murmur RESP-CTAB Skin - erythematous scattered tiny blistering lesions, on forearms, some more maculopapular, +exoriations, mild swelling of left upper lid, lesion between brows, upper left foreard  EXT- No edema Pulses- Radial, DP- 2+        Assessment & Plan:      Problem List Items Addressed This Visit      Unprioritized   HTN (hypertension)    bp elevated today, has been controlled Suspect some elevation due to intense itching with his allergic reaction He will keep eye on bp at home ensure it returns to baseline        Other Visit  Diagnoses    Dermatitis due to plants, including poison ivy, sumac, and oak    -  Primary   Depo Medrol 40mg  given IM , then start prednisone taper, no difficulty breathing noted, vision in tact, call if not improved      Note: This dictation was prepared with Dragon dictation along with smaller phrase technology. Any transcriptional errors that result from this process are unintentional.

## 2019-12-29 NOTE — Assessment & Plan Note (Signed)
bp elevated today, has been controlled Suspect some elevation due to intense itching with his allergic reaction He will keep eye on bp at home ensure it returns to baseline

## 2020-01-03 ENCOUNTER — Other Ambulatory Visit: Payer: Self-pay | Admitting: Family Medicine

## 2020-01-07 ENCOUNTER — Other Ambulatory Visit: Payer: Self-pay | Admitting: Family Medicine

## 2020-01-21 ENCOUNTER — Other Ambulatory Visit: Payer: Self-pay | Admitting: Family Medicine

## 2020-04-03 ENCOUNTER — Other Ambulatory Visit: Payer: Self-pay | Admitting: Family Medicine

## 2020-05-26 ENCOUNTER — Ambulatory Visit: Payer: Medicare Other | Admitting: Family Medicine

## 2020-06-12 ENCOUNTER — Other Ambulatory Visit: Payer: Medicare Other

## 2020-06-14 ENCOUNTER — Other Ambulatory Visit: Payer: Medicare Other

## 2020-06-15 ENCOUNTER — Encounter: Payer: Self-pay | Admitting: Family Medicine

## 2020-06-15 ENCOUNTER — Other Ambulatory Visit: Payer: Self-pay

## 2020-06-15 ENCOUNTER — Ambulatory Visit (INDEPENDENT_AMBULATORY_CARE_PROVIDER_SITE_OTHER): Payer: 59 | Admitting: Family Medicine

## 2020-06-15 VITALS — BP 154/82 | HR 68 | Temp 98.0°F | Resp 16 | Ht 71.0 in | Wt 185.0 lb

## 2020-06-15 DIAGNOSIS — Z0001 Encounter for general adult medical examination with abnormal findings: Secondary | ICD-10-CM

## 2020-06-15 DIAGNOSIS — Z0000 Encounter for general adult medical examination without abnormal findings: Secondary | ICD-10-CM

## 2020-06-15 DIAGNOSIS — K635 Polyp of colon: Secondary | ICD-10-CM

## 2020-06-15 DIAGNOSIS — Z125 Encounter for screening for malignant neoplasm of prostate: Secondary | ICD-10-CM

## 2020-06-15 DIAGNOSIS — I10 Essential (primary) hypertension: Secondary | ICD-10-CM

## 2020-06-15 DIAGNOSIS — E7800 Pure hypercholesterolemia, unspecified: Secondary | ICD-10-CM

## 2020-06-15 NOTE — Progress Notes (Signed)
Subjective:    Patient ID: [name], male    DOB: 22-Oct-1950, 70 y.o.   MRN: [id number]  HPI  Patient is a very pleasant 70 year old Caucasian male here today for complete physical exam.  His last colonoscopy was in 2020 and due to the presence of 3 polyps they recommended a repeat colonoscopy in 2023.  He is due for a PSA to screen for prostate cancer.  He is due for a tetanus shot as well as a COVID booster.  We discussed these both at length.  He denies any falls or depression or memory loss. Immunization History  Administered Date(s) Administered  . Fluad Quad(high Dose 65+) 02/11/2020  . Influenza Split 12/16/2012  . Influenza, High Dose Seasonal PF 11/25/2018  . Influenza,inj,Quad PF,6+ Mos 12/27/2015, 12/11/2016  . Influenza,trivalent, recombinat, inj, PF 10/21/2017  . Influenza-Unspecified 01/02/2014  . PFIZER(Purple Top)SARS-COV-2 Vaccination 05/09/2019, 05/31/2019  . Pneumococcal Conjugate-13 06/11/2017  . Pneumococcal Polysaccharide-23 05/26/2018  . Tdap 01/25/2011  . Zoster 01/03/2014  . Zoster Recombinat (Shingrix) 07/16/2017, 10/21/2017     Past Medical History:  Diagnosis Date  . Allergy    wood sap  . Allergy history, seafood    oysters, mussels  . Arthritis    neck  . Bee sting allergy 1980   hives after 20 yellowjacket stings  . Cancer ([place])    "skin cancer frozen off"  . Colon polyps    q 3 years, last colonoscopy 03/12/18  . GERD (gastroesophageal reflux disease)   . Hypercholesteremia   . Hypertension    Past Surgical History:  Procedure Laterality Date  . CARPAL TUNNEL RELEASE Right 1985   elbow  . SHOULDER ARTHROSCOPY WITH SUBACROMIAL DECOMPRESSION AND OPEN ROTATOR C Right 10/22/2012   Procedure: RIGHT SHOULDER ARTHROSCOPY WITH SUBACROMIAL DECOMPRESSION AND  ROTATOR CUFF REPAIR,  CORACOACROMIAL LIGAMENT RELEASE AND  MANIPULATION UNDER ANESTHESIA;  Surgeon: Johnn Hai, MD;  Location: [facility];  Service: Orthopedics;  Laterality: Right;    Current Outpatient Medications on File Prior to Visit  Medication Sig Dispense Refill  . amLODipine (NORVASC) 10 MG tablet TAKE 1 TABLET BY MOUTH  DAILY 120 tablet 2  . doxazosin (CARDURA) 4 MG tablet TAKE 1 TABLET BY MOUTH  DAILY 120 tablet 2  . KLOR-CON M20 20 MEQ tablet TAKE 1 TABLET BY MOUTH EVERY DAY 90 tablet 3  . levocetirizine (XYZAL) 5 MG tablet TAKE 1 TABLET BY MOUTH  EVERY EVENING 120 tablet 3  . losartan (COZAAR) 100 MG tablet TAKE 1 TABLET BY MOUTH IN  THE MORNING 120 tablet 2  . pantoprazole (PROTONIX) 40 MG tablet TAKE 1 TABLET BY MOUTH  DAILY 120 tablet 2  . rosuvastatin (CRESTOR) 10 MG tablet TAKE 1 TABLET BY MOUTH  DAILY 120 tablet 2  . zafirlukast (ACCOLATE) 20 MG tablet TAKE 1 TABLET BY MOUTH TWICE A DAY BEFORE A MEAL AS NEEDED 180 tablet 3   No current facility-administered medications on file prior to visit.   Allergies  Allergen Reactions  . Iodine     Topical antiseptic-as a young adult. Don't know reaction  . Other     Wood Sap causes Hives   Social History   Socioeconomic History  . Marital status: Married    Spouse name: Not on file  . Number of children: Not on file  . Years of education: Not on file  . Highest education level: Not on file  Occupational History  . Not on file  Tobacco Use  .  Smoking status: Former Smoker    Packs/day: 1.00    Years: 15.00    Pack years: 15.00    Types: Cigarettes    Quit date: 03/04/1984    Years since quitting: 36.3  . Smokeless tobacco: Former Systems developer    Types: [place] date: 03/04/2008  Substance and Sexual Activity  . Alcohol use: No  . Drug use: No  . Sexual activity: Not on file  Other Topics Concern  . Not on file  Social History Narrative  . Not on file   Social Determinants of Health   Financial Resource Strain: Not on file  Food Insecurity: Not on file  Transportation Needs: Not on file  Physical Activity: Not on file  Stress: Not on file  Social Connections: Not on file  Intimate  Partner Violence: Not on file   Family History  Problem Relation Age of Onset  . Heart failure Mother   . Heart failure Father      Review of Systems  All other systems reviewed and are negative.      Objective:   Physical Exam Vitals reviewed.  Constitutional:      General: He is not in acute distress.    Appearance: He is well-developed. He is not diaphoretic.  HENT:     Head: Normocephalic and atraumatic.     Right Ear: External ear normal.     Left Ear: External ear normal.     Nose: Nose normal.     Mouth/Throat:     Pharynx: No oropharyngeal exudate.  Eyes:     General: No scleral icterus.       Right eye: No discharge.        Left eye: No discharge.     Conjunctiva/sclera: Conjunctivae normal.     Pupils: Pupils are equal, round, and reactive to light.  Neck:     Thyroid: No thyromegaly.     Vascular: No JVD.     Trachea: No tracheal deviation.  Cardiovascular:     Rate and Rhythm: Normal rate and regular rhythm.     Heart sounds: Normal heart sounds. No murmur heard. No friction rub. No gallop.   Pulmonary:     Effort: Pulmonary effort is normal. No respiratory distress.     Breath sounds: Normal breath sounds. No stridor. No wheezing or rales.  Chest:     Chest wall: No tenderness.  Abdominal:     General: Bowel sounds are normal. There is no distension.     Palpations: Abdomen is soft. There is no mass.     Tenderness: There is no abdominal tenderness. There is no guarding or rebound.  Musculoskeletal:        General: No tenderness. Normal range of motion.     Cervical back: Normal range of motion and neck supple.  Lymphadenopathy:     Cervical: No cervical adenopathy.  Skin:    General: Skin is warm.     Coloration: Skin is not pale.     Findings: No erythema or rash.  Neurological:     Mental Status: He is alert and oriented to person, place, and time.     Cranial Nerves: No cranial nerve deficit.     Motor: No abnormal muscle tone.      Coordination: Coordination normal.     Deep Tendon Reflexes: Reflexes are normal and symmetric.  Psychiatric:        Behavior: Behavior normal.        Thought  Content: Thought content normal.        Judgment: Judgment normal.           Assessment & Plan:  Benign essential HTN - Plan: CBC with Differential/Platelet, COMPLETE METABOLIC PANEL WITH GFR, Lipid panel  Prostate cancer screening - Plan: PSA  Pure hypercholesterolemia  Polyp of colon, unspecified part of colon, unspecified type  Annual physical exam  Physical exam today is normal except for his blood pressure.  Of asked the patient to check his blood pressure over the next few days and report the values to me next week.  If greater than 140/90 will need to uptitrate his medication.  Colonoscopy is due next week.  Check a PSA to evaluate for prostate cancer.  Check a fasting lipid panel.  Goal LDL cholesterol is less than 100.  Recommended a booster on his COVID shot.  Recommended a tetanus shot.  Patient denies any falls, depression, or memory loss.  He does report some mild dysphagia to steak concerning for possible esophageal stricture.  I recommended a referral to GI for an endoscopy to evaluate further but he would like to hold off on that for now.

## 2020-06-16 LAB — CBC WITH DIFFERENTIAL/PLATELET
Absolute Monocytes: 598 cells/uL (ref 200–950)
Basophils Absolute: 79 cells/uL (ref 0–200)
Basophils Relative: 1.3 %
Eosinophils Absolute: 92 cells/uL (ref 15–500)
Eosinophils Relative: 1.5 %
HCT: 47.5 % (ref 38.5–50.0)
Hemoglobin: 15.9 g/dL (ref 13.2–17.1)
Lymphs Abs: 1379 cells/uL (ref 850–3900)
MCH: 32.1 pg (ref 27.0–33.0)
MCHC: 33.5 g/dL (ref 32.0–36.0)
MCV: 96 fL (ref 80.0–100.0)
MPV: 10.7 fL (ref 7.5–12.5)
Monocytes Relative: 9.8 %
Neutro Abs: 3953 cells/uL (ref 1500–7800)
Neutrophils Relative %: 64.8 %
Platelets: 223 10*3/uL (ref 140–400)
RBC: 4.95 10*6/uL (ref 4.20–5.80)
RDW: 11.9 % (ref 11.0–15.0)
Total Lymphocyte: 22.6 %
WBC: 6.1 10*3/uL (ref 3.8–10.8)

## 2020-06-16 LAB — PSA: PSA: 0.53 ng/mL (ref <=4.0)

## 2020-06-16 LAB — COMPLETE METABOLIC PANEL WITH GFR
AG Ratio: 2.2 (calc) (ref 1.0–2.5)
ALT: 19 U/L (ref 9–46)
AST: 21 U/L (ref 10–35)
Albumin: 4.4 g/dL (ref 3.6–5.1)
Alkaline phosphatase (APISO): 58 U/L (ref 35–144)
BUN: 17 mg/dL (ref 7–25)
CO2: 28 mmol/L (ref 20–32)
Calcium: 9.3 mg/dL (ref 8.6–10.3)
Chloride: 105 mmol/L (ref 98–110)
Creat: 0.83 mg/dL (ref 0.70–1.25)
GFR, Est African American: 105 mL/min/{1.73_m2} (ref >=60)
GFR, Est Non African American: 90 mL/min/{1.73_m2} (ref >=60)
Globulin: 2 g/dL (calc) (ref 1.9–3.7)
Glucose, Bld: 103 mg/dL — ABNORMAL HIGH (ref 65–99)
Potassium: 3.8 mmol/L (ref 3.5–5.3)
Sodium: 142 mmol/L (ref 135–146)
Total Bilirubin: 0.9 mg/dL (ref 0.2–1.2)
Total Protein: 6.4 g/dL (ref 6.1–8.1)

## 2020-06-16 LAB — LIPID PANEL
Cholesterol: 159 mg/dL (ref <200)
HDL: 63 mg/dL (ref >=40)
LDL Cholesterol (Calc): 85 mg/dL (calc)
Non-HDL Cholesterol (Calc): 96 mg/dL (calc) (ref <130)
Total CHOL/HDL Ratio: 2.5 (calc) (ref <5.0)
Triglycerides: 39 mg/dL (ref <150)

## 2020-06-19 ENCOUNTER — Other Ambulatory Visit: Payer: Medicare Other

## 2020-07-01 ENCOUNTER — Other Ambulatory Visit: Payer: Self-pay | Admitting: Family Medicine

## 2020-08-04 ENCOUNTER — Telehealth: Payer: Self-pay | Admitting: Family Medicine

## 2020-08-04 ENCOUNTER — Encounter: Payer: Self-pay | Admitting: Family Medicine

## 2020-08-04 NOTE — Telephone Encounter (Signed)
Pt called and stated that he might have been exposed to legionnaire disease, and wanted to know if needs to be seen even though he is not showing any symptoms.  Cb#: [phone]

## 2020-08-04 NOTE — Telephone Encounter (Signed)
Duplicate message. 

## 2020-08-25 ENCOUNTER — Other Ambulatory Visit: Payer: Self-pay | Admitting: Family Medicine

## 2020-08-25 DIAGNOSIS — I10 Essential (primary) hypertension: Secondary | ICD-10-CM

## 2020-08-30 ENCOUNTER — Other Ambulatory Visit: Payer: Self-pay | Admitting: Family Medicine

## 2020-11-05 ENCOUNTER — Encounter: Payer: Self-pay | Admitting: Family Medicine

## 2020-11-07 ENCOUNTER — Other Ambulatory Visit: Payer: Self-pay | Admitting: Family Medicine

## 2020-11-07 DIAGNOSIS — K222 Esophageal obstruction: Secondary | ICD-10-CM

## 2020-11-13 ENCOUNTER — Other Ambulatory Visit: Payer: Self-pay | Admitting: Family Medicine

## 2020-11-22 ENCOUNTER — Other Ambulatory Visit: Payer: Self-pay | Admitting: Family Medicine

## 2020-11-30 ENCOUNTER — Encounter: Payer: Self-pay | Admitting: Family Medicine

## 2020-12-12 ENCOUNTER — Other Ambulatory Visit: Payer: Self-pay

## 2020-12-12 ENCOUNTER — Ambulatory Visit (INDEPENDENT_AMBULATORY_CARE_PROVIDER_SITE_OTHER): Payer: 59 | Admitting: Family Medicine

## 2020-12-12 VITALS — BP 128/64 | HR 68 | Temp 97.9°F | Wt 191.0 lb

## 2020-12-12 DIAGNOSIS — D485 Neoplasm of uncertain behavior of skin: Secondary | ICD-10-CM | POA: Diagnosis not present

## 2020-12-12 DIAGNOSIS — Z23 Encounter for immunization: Secondary | ICD-10-CM

## 2020-12-12 DIAGNOSIS — L989 Disorder of the skin and subcutaneous tissue, unspecified: Secondary | ICD-10-CM

## 2020-12-12 NOTE — Progress Notes (Signed)
Subjective:    Patient ID: [name], male    DOB: 06-07-1950, 70 y.o.   MRN: [id number]  HPI Patient is a very pleasant 70 year old Caucasian gentleman who presents today with 2 lesions on his forehead.  1 is located on his left temple just above his eyebrow.  It is roughly 5 mm in diameter.  It is a gray patch of skin with a warty texture and I believe it is an early SK.  The second lesion is superior to that and slightly medial to that.  It is approximately 5 mm in diameter.  It is an erythematous patch of skin poorly defined with white scale.  I believe that this is likely an AK. Immunization History  Administered Date(s) Administered   Fluad Quad(high Dose 65+) 02/11/2020   Influenza Split 12/16/2012   Influenza, High Dose Seasonal PF 11/25/2018   Influenza,inj,Quad PF,6+ Mos 12/27/2015, 12/11/2016   Influenza,trivalent, recombinat, inj, PF 10/21/2017   Influenza-Unspecified 01/02/2014   PFIZER(Purple Top)SARS-COV-2 Vaccination 05/09/2019, 05/31/2019   Pneumococcal Conjugate-13 06/11/2017   Pneumococcal Polysaccharide-23 05/26/2018   Tdap 01/25/2011   Zoster Recombinat (Shingrix) 07/16/2017, 10/21/2017   Zoster, Live 01/03/2014     Past Medical History:  Diagnosis Date   Allergy    wood sap   Allergy history, seafood    oysters, mussels   Arthritis    neck   Bee sting allergy 1980   hives after 20 yellowjacket stings   Cancer ([place])    "skin cancer frozen off"   Colon polyps    q 3 years, last colonoscopy 03/12/18   GERD (gastroesophageal reflux disease)    Hypercholesteremia    Hypertension    Past Surgical History:  Procedure Laterality Date   CARPAL TUNNEL RELEASE Right 1985   elbow   SHOULDER ARTHROSCOPY WITH SUBACROMIAL DECOMPRESSION AND OPEN ROTATOR C Right 10/22/2012   Procedure: RIGHT SHOULDER ARTHROSCOPY WITH SUBACROMIAL DECOMPRESSION AND  ROTATOR CUFF REPAIR,  CORACOACROMIAL LIGAMENT RELEASE AND  MANIPULATION UNDER ANESTHESIA;  Surgeon: Johnn Hai, MD;  Location: [facility];  Service: Orthopedics;  Laterality: Right;   Current Outpatient Medications on File Prior to Visit  Medication Sig Dispense Refill   amLODipine (NORVASC) 10 MG tablet TAKE 1 TABLET BY MOUTH  DAILY 120 tablet 2   doxazosin (CARDURA) 4 MG tablet TAKE 1 TABLET BY MOUTH  DAILY 120 tablet 2   hydrochlorothiazide (HYDRODIURIL) 25 MG tablet TAKE 1 TABLET BY MOUTH  DAILY 120 tablet 2   KLOR-CON M20 20 MEQ tablet TAKE 1 TABLET BY MOUTH EVERY DAY 90 tablet 3   levocetirizine (XYZAL) 5 MG tablet TAKE 1 TABLET BY MOUTH IN  THE EVENING 120 tablet 3   losartan (COZAAR) 100 MG tablet TAKE 1 TABLET BY MOUTH IN  THE MORNING 120 tablet 2   pantoprazole (PROTONIX) 40 MG tablet TAKE 1 TABLET BY MOUTH  DAILY 120 tablet 2   rosuvastatin (CRESTOR) 10 MG tablet TAKE 1 TABLET BY MOUTH  DAILY 120 tablet 2   zafirlukast (ACCOLATE) 20 MG tablet TAKE 1 TABLET BY MOUTH  TWICE DAILY BEFORE MEALS 240 tablet 2   No current facility-administered medications on file prior to visit.   Allergies  Allergen Reactions   Iodine     Topical antiseptic-as a young adult. Don't know reaction   Other     Wood Sap causes Hives   Social History   Socioeconomic History   Marital status: Married    Spouse name: Not on file  Number of children: Not on file   Years of education: Not on file   Highest education level: Not on file  Occupational History   Not on file  Tobacco Use   Smoking status: Former    Packs/day: 1.00    Years: 15.00    Pack years: 15.00    Types: Cigarettes    Quit date: 03/04/1984    Years since quitting: 36.8   Smokeless tobacco: Former    Types: Chew    Quit date: 03/04/2008  Substance and Sexual Activity   Alcohol use: No   Drug use: No   Sexual activity: Not on file  Other Topics Concern   Not on file  Social History Narrative   Not on file   Social Determinants of Health   Financial Resource Strain: Not on file  Food Insecurity: Not on file  Transportation  Needs: Not on file  Physical Activity: Not on file  Stress: Not on file  Social Connections: Not on file  Intimate Partner Violence: Not on file   Family History  Problem Relation Age of Onset   Heart failure Mother    Heart failure Father      Review of Systems  All other systems reviewed and are negative.     Objective:   Physical Exam Vitals reviewed.  Constitutional:      General: He is not in acute distress.    Appearance: He is well-developed. He is not diaphoretic.  HENT:     Head: Normocephalic and atraumatic.   Neck:     Thyroid: No thyromegaly.     Vascular: No JVD.     Trachea: No tracheal deviation.  Cardiovascular:     Heart sounds: Normal heart sounds.  Pulmonary:     Effort: Pulmonary effort is normal.     Breath sounds: Normal breath sounds. No wheezing or rales.  Skin:    General: Skin is warm.     Findings: Lesion present.  Neurological:     Mental Status: He is alert.     Motor: No abnormal muscle tone.     Deep Tendon Reflexes: Reflexes are normal and symmetric.  Psychiatric:        Behavior: Behavior normal.        Thought Content: Thought content normal.        Judgment: Judgment normal.          Assessment & Plan:  Immunization due - Plan: Flu Vaccine QUAD 33mo+IM (Fluarix, Fluzone & Alfiuria Quad PF)  Skin lesion of face I believe the superior lesion as diagrammed is likely an AK.  I believe the inferior lesion is most likely an SK.  However this cannot be certain without a biopsy.  Together we decided to treat each lesion empirically with liquid nitrogen cryotherapy for a total of 30 seconds.  The patient tolerated the procedure well without complication.  If lesions persist, would recommend a shave biopsy.

## 2021-02-08 ENCOUNTER — Ambulatory Visit: Payer: Medicare Other | Admitting: Nurse Practitioner

## 2021-02-08 ENCOUNTER — Other Ambulatory Visit: Payer: Self-pay

## 2021-02-08 ENCOUNTER — Encounter: Payer: Self-pay | Admitting: Nurse Practitioner

## 2021-02-08 VITALS — BP 144/68 | HR 70 | Ht 70.0 in | Wt 198.6 lb

## 2021-02-08 DIAGNOSIS — R21 Rash and other nonspecific skin eruption: Secondary | ICD-10-CM

## 2021-02-08 MED ORDER — PREDNISONE 10 MG PO TABS: 10.0000 mg | ORAL_TABLET | Freq: Every day | ORAL | 0 refills | Status: DC

## 2021-02-08 NOTE — Progress Notes (Signed)
Subjective:    Patient ID: [name], male    DOB: 1951/02/01, 70 y.o.   MRN: [id number]  HPI: [name] is a 70 y.o. male presenting for  Chief Complaint  Patient presents with   Allergic Reaction    Allergic reaction face swelling x 2 days.   ALLERGIC REACTION Onset: 2 days Location: Duration: Pain: Severity: Characteristics: Timing: Aggravating Factors: Relieving Factors: Treatments attempted: Relief with treatments attempted: Meaning to patient: Associated signs & symptoms:  RASH Reports he is allergic to trees, dust Itchy on face, around eyes, back of neck.  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 Don't know reaction   Other     Wood Sap causes Hives    Outpatient Encounter Medications as of 02/08/2021  Medication Sig   amLODipine (NORVASC) 10 MG tablet TAKE 1 TABLET BY MOUTH  DAILY   doxazosin (CARDURA) 4 MG tablet TAKE 1 TABLET BY MOUTH  DAILY   hydrochlorothiazide (HYDRODIURIL) 25 MG tablet TAKE 1 TABLET BY MOUTH  DAILY   KLOR-CON M20 20 MEQ tablet TAKE 1 TABLET BY MOUTH EVERY DAY   levocetirizine (XYZAL) 5 MG tablet TAKE 1 TABLET BY MOUTH IN  THE EVENING   losartan (COZAAR) 100 MG tablet TAKE 1 TABLET BY MOUTH IN  THE MORNING   pantoprazole (PROTONIX) 40 MG tablet TAKE 1 TABLET BY MOUTH  DAILY   predniSONE (DELTASONE) 10 MG tablet Take 1 tablet (10 mg total) by mouth daily with breakfast. Take 40mg  on days 1-2. Take 30mg  on days 3-4. Take  20mg  on days 5-6. Take 10mg  on days 7-8. Take 5mg  on days 9-10, then stop.   rosuvastatin (CRESTOR) 10 MG tablet TAKE 1 TABLET BY MOUTH  DAILY   zafirlukast (ACCOLATE) 20 MG tablet TAKE 1 TABLET BY MOUTH  TWICE DAILY BEFORE MEALS   No facility-administered encounter medications on file as of 02/08/2021.    Patient Active Problem List   Diagnosis Date Noted   Acute allergic reaction 07/26/2017   Colon polyps    HTN (hypertension) 06/18/2012   HLD (hyperlipidemia) 06/18/2012   GERD (gastroesophageal reflux disease)     Past Medical History:  Diagnosis Date   Allergy    wood sap   Allergy history, seafood    oysters, mussels   Arthritis    neck   Bee sting allergy 1980   hives after 20 yellowjacket stings   Cancer ([place])    "skin cancer frozen off"   Colon polyps    q 3 years, last colonoscopy 03/12/18   GERD (gastroesophageal reflux disease)    Hypercholesteremia    Hypertension     Relevant past medical, surgical, family and social history reviewed and updated as indicated. Interim medical history since our last visit reviewed.  Review of Systems Per HPI unless specifically indicated above     Objective:    BP (!) 144/68   Pulse 70   Ht 5\' 10"  (1.778 m)   Wt 198 lb 9.6 oz (90.1 kg)   SpO2 98%  BMI 28.50 kg/m   Wt Readings from Last 3 Encounters:  02/08/21 198 lb 9.6 oz (90.1 kg)  12/12/20 191 lb (86.6 kg)  06/15/20 185 lb (83.9 kg)    Physical Exam Vitals and nursing note reviewed.  Constitutional:      General: He is not in acute distress.    Appearance: Normal appearance. He is not ill-appearing or toxic-appearing.  HENT:     Head: Normocephalic and atraumatic.     Nose: Nose normal. No congestion or rhinorrhea.     Mouth/Throat:     Mouth: Mucous membranes are moist.     Pharynx: Oropharynx is clear. No oropharyngeal exudate or posterior oropharyngeal erythema.  Eyes:     General: No scleral icterus.       Right eye: No discharge.        Left eye:  No discharge.     Extraocular Movements: Extraocular movements intact.  Cardiovascular:     Rate and Rhythm: Normal rate and regular rhythm.  Pulmonary:     Effort: Pulmonary effort is normal. No respiratory distress.     Breath sounds: Normal breath sounds. No wheezing, rhonchi or rales.  Musculoskeletal:     Cervical back: Normal range of motion.  Lymphadenopathy:     Cervical: No cervical adenopathy.  Skin:    General: Skin is warm and dry.     Coloration: Skin is not jaundiced or pale.     Findings: Erythema and rash present. Rash is papular.     Comments: Papular, rough, slightly erythematous rash noted to face including chin, cheeks, and above eyebrows  Neurological:     Mental Status: He is alert and oriented to person, place, and time.     Motor: No weakness.  Psychiatric:        Mood and Affect: Mood normal.        Behavior: Behavior normal.        Thought Content: Thought content normal.        Judgment: Judgment normal.      Assessment & Plan:  1. Rash Acute.  Findings sound consistent with previous allergic reactions.  No red flags today.  Start prednisone taper to help with allergic response.  Can consider allergy testing again to determine triggers if symptoms recur within next few months.  Return to clinic if symptoms persist or worsen.  - predniSONE (DELTASONE) 10 MG tablet; Take 1 tablet (10 mg total) by mouth daily with breakfast. Take 40mg  on days 1-2. Take 30mg  on days 3-4. Take 20mg  on days 5-6. Take 10mg  on days 7-8. Take 5mg  on days 9-10, then stop.  Dispense: 21 tablet; Refill: 0    Follow up plan: Return if symptoms worsen or fail to improve.

## 2021-02-13 ENCOUNTER — Other Ambulatory Visit: Payer: Self-pay

## 2021-02-13 MED ORDER — ZAFIRLUKAST 20 MG PO TABS: ORAL_TABLET | ORAL | 0 refills | Status: DC

## 2021-03-09 ENCOUNTER — Ambulatory Visit: Payer: Medicare Other | Admitting: Nurse Practitioner

## 2021-03-09 ENCOUNTER — Encounter: Payer: Self-pay | Admitting: Nurse Practitioner

## 2021-03-09 ENCOUNTER — Other Ambulatory Visit: Payer: Self-pay

## 2021-03-09 VITALS — BP 150/78 | HR 76 | Temp 97.7°F | Resp 16 | Ht 70.0 in | Wt 193.0 lb

## 2021-03-09 DIAGNOSIS — R21 Rash and other nonspecific skin eruption: Secondary | ICD-10-CM

## 2021-03-09 MED ORDER — PREDNISONE 10 MG PO TABS: ORAL_TABLET | ORAL | 0 refills | Status: DC

## 2021-03-09 NOTE — Progress Notes (Signed)
Subjective:    Patient ID: [name], male    DOB: 07/12/1950, 71 y.o.   MRN: [id number]  HPI: [name] is a 71 y.o. male presenting for rash.  Chief Complaint  Patient presents with   Allergic Reaction    Sawed up lumber and wood. Droopy eyes  Itchy ears   RASH Reports he is allergic to trees, dust.  He was building a project with old wood in his barn earlier this week and thought the wood would have been free of sap.  He started with rash symptoms a few days ago.  He is itchy on his face, around his eyes, back of neck, and left knee.  Duration:  4 days Location: face, neck, left knee Itching: yes Burning: no Redness: yes Oozing: no Scaling: no Blisters: no Painful: no Fevers: no Change in detergents/soaps/personal care products: no Recent illness: no Recent travel:no History of same: yes Context: stable Alleviating factors: Benadryl, Claritin Treatments attempted: Benadryl, claritin   Shortness of breath: no  Throat/tongue swelling: no Myalgias/arthralgias: no  Allergies  Allergen Reactions   Iodine     Topical antiseptic-as a young adult. Don't know reaction   Other     Wood Sap causes Hives    Outpatient Encounter Medications as of 03/09/2021  Medication Sig   amLODipine (NORVASC) 10 MG tablet TAKE 1 TABLET BY MOUTH  DAILY   doxazosin (CARDURA) 4 MG tablet TAKE 1 TABLET BY MOUTH  DAILY   hydrochlorothiazide (HYDRODIURIL) 25 MG tablet TAKE 1 TABLET BY MOUTH  DAILY   KLOR-CON M20 20 MEQ tablet TAKE 1 TABLET BY MOUTH EVERY DAY   levocetirizine (XYZAL) 5 MG tablet TAKE 1 TABLET BY MOUTH IN  THE EVENING   losartan (COZAAR) 100 MG tablet TAKE 1 TABLET BY MOUTH IN  THE MORNING   pantoprazole (PROTONIX) 40 MG tablet TAKE 1 TABLET BY MOUTH  DAILY   predniSONE (DELTASONE) 10 MG tablet Take 4 tablets (40 mg total) by mouth daily with breakfast for 1 day, THEN 3 tablets (30 mg total) daily with breakfast for 1 day, THEN 2 tablets (20 mg total) daily  with breakfast for 1 day, THEN 1 tablet (10 mg total) daily with breakfast for 1 day.   rosuvastatin (CRESTOR) 10 MG tablet TAKE 1 TABLET BY MOUTH  DAILY   zafirlukast (ACCOLATE) 20 MG tablet TAKE 1 TABLET BY MOUTH  TWICE DAILY BEFORE MEALS   [DISCONTINUED] predniSONE (DELTASONE) 10 MG tablet Take 1 tablet (10 mg total) by mouth daily with breakfast. Take 40mg  on days 1-2. Take 30mg  on days 3-4. Take 20mg  on days 5-6. Take 10mg  on days 7-8. Take 5mg  on days 9-10, then stop.   No facility-administered encounter medications on file as of 03/09/2021.    Patient Active Problem List   Diagnosis Date Noted   Acute allergic reaction 07/26/2017   Colon polyps    HTN (hypertension) 06/18/2012   HLD (hyperlipidemia) 06/18/2012   GERD (gastroesophageal reflux disease)     Past Medical History:  Diagnosis Date   Allergy    wood sap   Allergy history, seafood    oysters, mussels   Arthritis    neck   Bee sting allergy 1980   hives after 20 yellowjacket stings   Cancer ([place])    "skin cancer frozen off"   Colon polyps    q 3 years, last colonoscopy 03/12/18   GERD (gastroesophageal reflux disease)    Hypercholesteremia    Hypertension  Relevant past medical, surgical, family and social history reviewed and updated as indicated. Interim medical history since our last visit reviewed.  Review of Systems Per HPI unless specifically indicated above     Objective:    BP (!) 150/78 (BP Location: Left Arm, Patient Position: Sitting, Cuff Size: Normal)    Pulse 76    Temp 97.7 F (36.5 C) (Temporal)    Resp 16    Ht 5\' 10"  (1.778 m)    Wt 193 lb (87.5 kg)    SpO2 97%    BMI 27.69 kg/m   Wt Readings from Last 3 Encounters:  03/09/21 193 lb (87.5 kg)  02/08/21 198 lb 9.6 oz (90.1 kg)  12/12/20 191 lb (86.6 kg)    Physical Exam Vitals and nursing note reviewed.  Constitutional:      General: He is not in acute distress.    Appearance: He is not toxic-appearing.  HENT:     Head:  Normocephalic and atraumatic.  Eyes:     General: No scleral icterus.    Extraocular Movements: Extraocular movements intact.  Pulmonary:     Effort: Pulmonary effort is normal. No respiratory distress.  Skin:    General: Skin is warm and dry.     Capillary Refill: Capillary refill takes less than 2 seconds.     Coloration: Skin is not jaundiced or pale.     Findings: Erythema and rash present.     Comments: Erythematous, annular, papular lesions to face above eyes, and neck.  No oozing or drainage.  They are urticarial and there is some excoriation on posterior left neck.  Neurological:     Mental Status: He is alert and oriented to person, place, and time.  Psychiatric:        Mood and Affect: Mood normal.        Behavior: Behavior normal.        Thought Content: Thought content normal.        Judgment: Judgment normal.      Assessment & Plan:  1. Rash We discussed treatment options since he was recently treated with 10 days of prednisone about 1 month ago.  We discussed steroid injection in the office vs. Short taper of prednisone.  We decided to start a short taper of prednisone given the upcoming weekend.  Patient will let us know if his symptoms persist or worsen.  - predniSONE (DELTASONE) 10 MG tablet; Take 4 tablets (40 mg total) by mouth daily with breakfast for 1 day, THEN 3 tablets (30 mg total) daily with breakfast for 1 day, THEN 2 tablets (20 mg total) daily with breakfast for 1 day, THEN 1 tablet (10 mg total) daily with breakfast for 1 day.  Dispense: 10 tablet; Refill: 0    Follow up plan: Return if symptoms worsen or fail to improve.

## 2021-03-12 ENCOUNTER — Other Ambulatory Visit: Payer: Self-pay | Admitting: Nurse Practitioner

## 2021-03-12 ENCOUNTER — Encounter: Payer: Self-pay | Admitting: Nurse Practitioner

## 2021-03-12 DIAGNOSIS — R21 Rash and other nonspecific skin eruption: Secondary | ICD-10-CM

## 2021-03-12 MED ORDER — PREDNISONE 10 MG PO TABS: ORAL_TABLET | ORAL | 0 refills | Status: AC

## 2021-04-27 ENCOUNTER — Telehealth: Payer: Self-pay | Admitting: Family Medicine

## 2021-04-27 NOTE — Telephone Encounter (Signed)
Patient called for clarification of bill received for DOS 03/09/2021. Outstanding amount is $62.70; amount due doesn't match copay amount.   Please advise at [phone] (cell), or [phone] (home).

## 2021-05-15 NOTE — Telephone Encounter (Signed)
I have looked at account and see that the balance is coming from a deductible amount I also see where patient paid this amount on 05/07/21. I have left him a voicemail to advise him that it was a deductible amount. ?

## 2021-05-15 NOTE — Telephone Encounter (Signed)
Patient returned your call; gave patient your message. Patient already aware; stated you can call him back if you need him.  ?

## 2021-05-17 ENCOUNTER — Other Ambulatory Visit: Payer: Self-pay | Admitting: Family Medicine

## 2021-06-14 ENCOUNTER — Other Ambulatory Visit: Payer: Medicare Other

## 2021-06-18 ENCOUNTER — Ambulatory Visit (INDEPENDENT_AMBULATORY_CARE_PROVIDER_SITE_OTHER): Payer: Medicare Other | Admitting: Family Medicine

## 2021-06-18 VITALS — BP 138/78 | HR 68 | Temp 97.3°F | Ht 70.0 in | Wt 177.8 lb

## 2021-06-18 DIAGNOSIS — I10 Essential (primary) hypertension: Secondary | ICD-10-CM

## 2021-06-18 DIAGNOSIS — K635 Polyp of colon: Secondary | ICD-10-CM

## 2021-06-18 DIAGNOSIS — E7800 Pure hypercholesterolemia, unspecified: Secondary | ICD-10-CM | POA: Diagnosis not present

## 2021-06-18 DIAGNOSIS — Z125 Encounter for screening for malignant neoplasm of prostate: Secondary | ICD-10-CM | POA: Diagnosis not present

## 2021-06-18 DIAGNOSIS — Z0000 Encounter for general adult medical examination without abnormal findings: Secondary | ICD-10-CM | POA: Diagnosis not present

## 2021-06-18 DIAGNOSIS — R5383 Other fatigue: Secondary | ICD-10-CM

## 2021-06-18 NOTE — Progress Notes (Signed)
? ?Subjective:  ? ? Patient ID: [name], male    DOB: [birth year], 71 y.o.   MRN: [id number] ? ?HPI ?Patient is a very pleasant 71 year old Caucasian gentleman here today for complete physical exam.  He complains of fatigue.  He also complains of hair loss on his arms and on his legs.  However he has excellent peripheral pulses.  He has 2/4 dorsalis pedis pulses bilaterally and posterior tibialis pulses.  He denies any symptoms of claudication.  He does have fatigue and decreased libido which makes me concerned about possible testosterone deficiency.  He also complains of neuropathy in his feet.  He denies numbness in his feet but he does report diminished sensation.  He describes it as feeling like there is a thick callus on the bottoms of both feet.  Visual inspection shows normal skin.  There is no breakdown in skin integrity.  However he does have diminished sensation.  I suspect that he is developing peripheral neuropathy.  He is due for a tetanus shot.  He is also due for a COVID booster.  We discussed both of these.  His last colonoscopy was 2020 and they found 3 polyps.  He is due for repeat colonoscopy. ?Immunization History  ?Administered Date(s) Administered  ? Fluad Quad(high Dose 65+) 02/11/2020, 12/12/2020  ? Influenza Split 12/16/2012  ? Influenza, High Dose Seasonal PF 11/25/2018  ? Influenza,inj,Quad PF,6+ Mos 12/27/2015, 12/11/2016  ? Influenza,trivalent, recombinat, inj, PF 10/21/2017  ? Influenza-Unspecified 01/02/2014  ? PFIZER(Purple Top)SARS-COV-2 Vaccination 05/09/2019, 05/31/2019  ? Pneumococcal Conjugate-13 06/11/2017  ? Pneumococcal Polysaccharide-23 05/26/2018  ? Tdap 01/25/2011  ? Zoster Recombinat (Shingrix) 07/16/2017, 10/21/2017  ? Zoster, Live 01/03/2014  ? ? ? ?Past Medical History:  ?Diagnosis Date  ? Allergy   ? wood sap  ? Allergy history, seafood   ? oysters, mussels  ? Arthritis   ? neck  ? Bee sting allergy 1980  ? hives after 20 yellowjacket stings  ? Cancer [facility])    ? "skin cancer frozen off"  ? Colon polyps   ? q 3 years, last colonoscopy 03/12/18  ? GERD (gastroesophageal reflux disease)   ? Hypercholesteremia   ? Hypertension   ? ?Past Surgical History:  ?Procedure Laterality Date  ? CARPAL TUNNEL RELEASE Right 1985  ? elbow  ? SHOULDER ARTHROSCOPY WITH SUBACROMIAL DECOMPRESSION AND OPEN ROTATOR C Right 10/22/2012  ? Procedure: RIGHT SHOULDER ARTHROSCOPY WITH SUBACROMIAL DECOMPRESSION AND  ROTATOR CUFF REPAIR,  CORACOACROMIAL LIGAMENT RELEASE AND  MANIPULATION UNDER ANESTHESIA;  Surgeon: Johnn Hai, MD;  Location: [facility];  Service: Orthopedics;  Laterality: Right;  ? ?Current Outpatient Medications on File Prior to Visit  ?Medication Sig Dispense Refill  ? amLODipine (NORVASC) 10 MG tablet TAKE 1 TABLET BY MOUTH  DAILY 120 tablet 2  ? doxazosin (CARDURA) 4 MG tablet TAKE 1 TABLET BY MOUTH  DAILY 120 tablet 2  ? hydrochlorothiazide (HYDRODIURIL) 25 MG tablet TAKE 1 TABLET BY MOUTH  DAILY 120 tablet 2  ? KLOR-CON M20 20 MEQ tablet TAKE 1 TABLET BY MOUTH EVERY DAY 90 tablet 3  ? levocetirizine (XYZAL) 5 MG tablet TAKE 1 TABLET BY MOUTH IN  THE EVENING 120 tablet 3  ? losartan (COZAAR) 100 MG tablet TAKE 1 TABLET BY MOUTH IN  THE MORNING 120 tablet 2  ? pantoprazole (PROTONIX) 40 MG tablet TAKE 1 TABLET BY MOUTH  DAILY 120 tablet 2  ? rosuvastatin (CRESTOR) 10 MG tablet TAKE 1 TABLET BY MOUTH  DAILY 120  tablet 2  ? zafirlukast (ACCOLATE) 20 MG tablet TAKE 1 TABLET BY MOUTH TWICE A DAY BEFORE MEALS 180 tablet 3  ? ?No current facility-administered medications on file prior to visit.  ? ?Allergies  ?Allergen Reactions  ? Iodine   ?  Topical antiseptic-as a young adult. Don't know reaction  ? Other   ?  Wood Sap causes Hives  ? ?Social History  ? ?Socioeconomic History  ? Marital status: Married  ?  Spouse name: Not on file  ? Number of children: Not on file  ? Years of education: Not on file  ? Highest education level: Not on file  ?Occupational History  ? Not on file  ?Tobacco  Use  ? Smoking status: Former  ?  Packs/day: 1.00  ?  Years: 15.00  ?  Pack years: 15.00  ?  Types: Cigarettes  ?  Quit date: 03/04/1984  ?  Years since quitting: 37.3  ? Smokeless tobacco: Former  ?  Types: Chew  ?  Quit date: 03/04/2008  ?Substance and Sexual Activity  ? Alcohol use: No  ? Drug use: No  ? Sexual activity: Not on file  ?Other Topics Concern  ? Not on file  ?Social History Narrative  ? Not on file  ? ?Social Determinants of Health  ? ?Financial Resource Strain: Not on file  ?Food Insecurity: Not on file  ?Transportation Needs: Not on file  ?Physical Activity: Not on file  ?Stress: Not on file  ?Social Connections: Not on file  ?Intimate Partner Violence: Not on file  ? ?Family History  ?Problem Relation Age of Onset  ? Heart failure Mother   ? Heart failure Father   ? ? ? ?Review of Systems  ?All other systems reviewed and are negative. ? ?   ?Objective:  ? Physical Exam ?Vitals reviewed.  ?Constitutional:   ?   General: He is not in acute distress. ?   Appearance: He is well-developed. He is not diaphoretic.  ?HENT:  ?   Head: Normocephalic and atraumatic.  ?   Right Ear: External ear normal.  ?   Left Ear: External ear normal.  ?   Nose: Nose normal.  ?   Mouth/Throat:  ?   Pharynx: No oropharyngeal exudate.  ?Eyes:  ?   General: No scleral icterus.    ?   Right eye: No discharge.     ?   Left eye: No discharge.  ?   Conjunctiva/sclera: Conjunctivae normal.  ?   Pupils: Pupils are equal, round, and reactive to light.  ?Neck:  ?   Thyroid: No thyromegaly.  ?   Vascular: No JVD.  ?   Trachea: No tracheal deviation.  ?Cardiovascular:  ?   Rate and Rhythm: Normal rate and regular rhythm.  ?   Heart sounds: Normal heart sounds. No murmur heard. ?  No friction rub. No gallop.  ?Pulmonary:  ?   Effort: Pulmonary effort is normal. No respiratory distress.  ?   Breath sounds: Normal breath sounds. No stridor. No wheezing or rales.  ?Chest:  ?   Chest wall: No tenderness.  ?Abdominal:  ?   General: Bowel sounds  are normal. There is no distension.  ?   Palpations: Abdomen is soft. There is no mass.  ?   Tenderness: There is no abdominal tenderness. There is no guarding or rebound.  ?Musculoskeletal:     ?   General: No tenderness. Normal range of motion.  ?   Cervical  back: Normal range of motion and neck supple.  ?Lymphadenopathy:  ?   Cervical: No cervical adenopathy.  ?Skin: ?   General: Skin is warm.  ?   Coloration: Skin is not pale.  ?   Findings: No erythema or rash.  ?Neurological:  ?   Mental Status: He is alert and oriented to person, place, and time.  ?   Cranial Nerves: No cranial nerve deficit.  ?   Motor: No abnormal muscle tone.  ?   Coordination: Coordination normal.  ?   Deep Tendon Reflexes: Reflexes are normal and symmetric.  ?Psychiatric:     ?   Behavior: Behavior normal.     ?   Thought Content: Thought content normal.     ?   Judgment: Judgment normal.  ? ? ? ? ? ?   ?Assessment & Plan:  ?Benign essential HTN - Plan: CBC with Differential/Platelet, Lipid panel, COMPLETE METABOLIC PANEL WITH GFR ? ?Prostate cancer screening - Plan: PSA ? ?Pure hypercholesterolemia - Plan: CBC with Differential/Platelet, Lipid panel, COMPLETE METABOLIC PANEL WITH GFR ? ?Annual physical exam - Plan: CBC with Differential/Platelet, Lipid panel, COMPLETE METABOLIC PANEL WITH GFR, PSA ? ?Polyp of colon, unspecified part of colon, unspecified type - Plan: Ambulatory referral to Gastroenterology ? ?Fatigue, unspecified type - Plan: Testosterone Total,Free,Bio, Males, Vitamin B12 ?Because of the fatigue and the loss of hair, I will check a testosterone level.  Because of the fatigue and the neuropathy I will check a B12 level.  His blood pressure today is well controlled at 138/78.  I am checking a CBC a CMP and a lipid panel.  I like his LDL cholesterol to be below 100.  I will screen for prostate cancer with a PSA.  I will consult his gastroenterologist to schedule colonoscopy.  I recommended a tetanus shot if he decides to  get 1 and also recommended a COVID booster.  He will consider these and get back to me. ?

## 2021-06-19 ENCOUNTER — Encounter: Payer: Self-pay | Admitting: Family Medicine

## 2021-06-19 LAB — CBC WITH DIFFERENTIAL/PLATELET
Absolute Monocytes: 924 cells/uL (ref 200–950)
Basophils Absolute: 40 cells/uL (ref 0–200)
Basophils Relative: 0.6 %
Eosinophils Absolute: 92 cells/uL (ref 15–500)
Eosinophils Relative: 1.4 %
HCT: 49 % (ref 38.5–50.0)
Hemoglobin: 16.5 g/dL (ref 13.2–17.1)
Lymphs Abs: 937 cells/uL (ref 850–3900)
MCH: 32.2 pg (ref 27.0–33.0)
MCHC: 33.7 g/dL (ref 32.0–36.0)
MCV: 95.5 fL (ref 80.0–100.0)
MPV: 10.5 fL (ref 7.5–12.5)
Monocytes Relative: 14 %
Neutro Abs: 4607 cells/uL (ref 1500–7800)
Neutrophils Relative %: 69.8 %
Platelets: 234 10*3/uL (ref 140–400)
RBC: 5.13 10*6/uL (ref 4.20–5.80)
RDW: 12.1 % (ref 11.0–15.0)
Total Lymphocyte: 14.2 %
WBC: 6.6 10*3/uL (ref 3.8–10.8)

## 2021-06-19 LAB — COMPLETE METABOLIC PANEL WITH GFR
AG Ratio: 1.9 (calc) (ref 1.0–2.5)
ALT: 36 U/L (ref 9–46)
AST: 31 U/L (ref 10–35)
Albumin: 4.3 g/dL (ref 3.6–5.1)
Alkaline phosphatase (APISO): 60 U/L (ref 35–144)
BUN: 12 mg/dL (ref 7–25)
CO2: 29 mmol/L (ref 20–32)
Calcium: 9.9 mg/dL (ref 8.6–10.3)
Chloride: 106 mmol/L (ref 98–110)
Creat: 1.3 mg/dL (ref 0.70–1.35)
Globulin: 2.3 g/dL (calc) (ref 1.9–3.7)
Glucose, Bld: 107 mg/dL — ABNORMAL HIGH (ref 65–99)
Potassium: 4.6 mmol/L (ref 3.5–5.3)
Sodium: 145 mmol/L (ref 135–146)
Total Bilirubin: 0.9 mg/dL (ref 0.2–1.2)
Total Protein: 6.6 g/dL (ref 6.1–8.1)
eGFR: 59 mL/min/{1.73_m2} — ABNORMAL LOW (ref >=60)

## 2021-06-19 LAB — LIPID PANEL
Cholesterol: 158 mg/dL (ref <200)
HDL: 45 mg/dL (ref >=40)
LDL Cholesterol (Calc): 93 mg/dL (calc)
Non-HDL Cholesterol (Calc): 113 mg/dL (calc) (ref <130)
Total CHOL/HDL Ratio: 3.5 (calc) (ref <5.0)
Triglycerides: 104 mg/dL (ref <150)

## 2021-06-19 LAB — PSA: PSA: 0.57 ng/mL (ref <=4.00)

## 2021-06-19 LAB — TESTOSTERONE TOTAL,FREE,BIO, MALES
Albumin: 4.3 g/dL (ref 3.6–5.1)
Sex Hormone Binding: 38 nmol/L (ref 22–77)
Testosterone, Bioavailable: 65.5 ng/dL — ABNORMAL LOW (ref 110.0–575.0)
Testosterone, Free: 33.2 pg/mL — ABNORMAL LOW (ref 46.0–224.0)
Testosterone: 292 ng/dL (ref 250–827)

## 2021-06-19 LAB — VITAMIN B12: Vitamin B-12: 311 pg/mL (ref 200–1100)

## 2021-06-21 ENCOUNTER — Other Ambulatory Visit: Payer: Self-pay | Admitting: Family Medicine

## 2021-06-21 MED ORDER — TESTOSTERONE 20.25 MG/ACT (1.62%) TD GEL: 2.0000 | Freq: Every day | TRANSDERMAL | 3 refills | Status: DC

## 2021-07-06 ENCOUNTER — Other Ambulatory Visit: Payer: Self-pay | Admitting: Family Medicine

## 2021-07-06 NOTE — Telephone Encounter (Signed)
Requested Prescriptions  ?Pending Prescriptions Disp Refills  ?? levocetirizine (XYZAL) 5 MG tablet [Pharmacy Med Name: Levocetirizine Dihydrochloride 5 MG Oral Tablet] 120 tablet 3  ?  Sig: TAKE 1 TABLET BY MOUTH IN  THE EVENING  ?  ? Ear, Nose, and Throat:  Antihistamines - levocetirizine dihydrochloride Passed - 07/06/2021  9:53 AM  ?  ?  Passed - Cr in normal range and within 360 days  ?  Creat  ?Date Value Ref Range Status  ?06/18/2021 1.30 0.70 - 1.35 mg/dL Final  ?   ?  ?  Passed - eGFR is 10 or above and within 360 days  ?  GFR, Est African American  ?Date Value Ref Range Status  ?06/15/2020 105 > OR = 60 mL/min/1.87m2 Final  ? ?GFR, Est Non African American  ?Date Value Ref Range Status  ?06/15/2020 90 > OR = 60 mL/min/1.42m2 Final  ? ?eGFR  ?Date Value Ref Range Status  ?06/18/2021 59 (L) > OR = 60 mL/min/1.54m2 Final  ?  Comment:  ?  The eGFR is based on the CKD-EPI 2021 equation. To calculate  ?the new eGFR from a previous Creatinine or Cystatin C ?result, go to https://www.kidney.org/professionals/ ?kdoqi/gfr%5Fcalculator ?  ?   ?  ?  Passed - Valid encounter within last 12 months  ?  Recent Outpatient Visits   ?      ? 2 weeks ago Benign essential HTN  ? [facility] [place], Cammie Mcgee, MD  ? 3 months ago Rash  ? [place], Jessica A, NP  ? 4 months ago Rash  ? [place], NP  ? 1 year ago Benign essential HTN  ? [facility] [place], Cammie Mcgee, MD  ? 1 year ago Dermatitis due to plants, including poison ivy, sumac, and oak  ? [place], Modena Nunnery, MD  ?  ?  ? ?  ?  ?  ? ? ?

## 2021-08-26 ENCOUNTER — Other Ambulatory Visit: Payer: Self-pay | Admitting: Family Medicine

## 2021-08-26 DIAGNOSIS — I10 Essential (primary) hypertension: Secondary | ICD-10-CM

## 2021-09-03 ENCOUNTER — Encounter: Payer: Self-pay | Admitting: Family Medicine

## 2021-09-03 DIAGNOSIS — I10 Essential (primary) hypertension: Secondary | ICD-10-CM

## 2021-09-03 MED ORDER — AMLODIPINE BESYLATE 10 MG PO TABS: 10.0000 mg | ORAL_TABLET | Freq: Every day | ORAL | 2 refills | Status: DC

## 2021-10-04 ENCOUNTER — Encounter: Payer: Self-pay | Admitting: Family Medicine

## 2021-10-05 ENCOUNTER — Other Ambulatory Visit: Payer: Self-pay | Admitting: Family Medicine

## 2021-10-05 MED ORDER — HYDROCODONE BIT-HOMATROP MBR 5-1.5 MG/5ML PO SOLN: 5.0000 mL | Freq: Three times a day (TID) | ORAL | 0 refills | Status: DC | PRN

## 2021-10-26 ENCOUNTER — Other Ambulatory Visit: Payer: Self-pay | Admitting: Family Medicine

## 2021-10-26 NOTE — Telephone Encounter (Signed)
Requested medication (s) are due for refill today: Yes  Requested medication (s) are on the active medication list: Yes  Last refill:  08/25/20  Future visit scheduled: No  Notes to clinic:  Prescription has expired.    Requested Prescriptions  Pending Prescriptions Disp Refills   losartan (COZAAR) 100 MG tablet [Pharmacy Med Name: Losartan Potassium 100 MG Oral Tablet] 120 tablet 2    Sig: TAKE 1 TABLET BY MOUTH IN  THE MORNING     Cardiovascular:  Angiotensin Receptor Blockers Failed - 10/26/2021  8:48 AM      Failed - Valid encounter within last 6 months    Recent Outpatient Visits           4 months ago Benign essential HTN   [place] Susy Frizzle, MD   7 months ago [place], NP   8 months ago [place] Eulogio Bear, NP   1 year ago Benign essential HTN   [place] Pickard, Cammie Mcgee, MD   1 year ago Dermatitis due to plants, including poison ivy, sumac, and oak   [place], Modena Nunnery, MD              Passed - Cr in normal range and within 180 days    Creat  Date Value Ref Range Status  06/18/2021 1.30 0.70 - 1.35 mg/dL Final         Passed - K in normal range and within 180 days    Potassium  Date Value Ref Range Status  06/18/2021 4.6 3.5 - 5.3 mmol/L Final         Passed - Patient is not pregnant      Passed - Last BP in normal range    BP Readings from Last 1 Encounters:  06/18/21 138/78

## 2021-10-27 ENCOUNTER — Other Ambulatory Visit: Payer: Self-pay | Admitting: Family Medicine

## 2021-10-29 NOTE — Telephone Encounter (Signed)
Requested Prescriptions  Pending Prescriptions Disp Refills  . rosuvastatin (CRESTOR) 10 MG tablet [Pharmacy Med Name: Rosuvastatin Calcium 10 MG Oral Tablet] 90 tablet 2    Sig: TAKE 1 TABLET BY MOUTH  DAILY     Cardiovascular:  Antilipid - Statins 2 Failed - 10/27/2021 10:37 AM      Failed - Lipid Panel in normal range within the last 12 months    Cholesterol  Date Value Ref Range Status  06/18/2021 158 <200 mg/dL Final   LDL Cholesterol (Calc)  Date Value Ref Range Status  06/18/2021 93 mg/dL (calc) Final    Comment:    Reference range: <100 . Desirable range <100 mg/dL for primary prevention;   <70 mg/dL for patients with CHD or diabetic patients  with > or = 2 CHD risk factors. Marland Kitchen LDL-C is now calculated using the Martin-Hopkins  calculation, which is a validated novel method providing  better accuracy than the Friedewald equation in the  estimation of LDL-C.  Cresenciano Genre et al. Annamaria Helling. 5974;163(84): 2061-2068  (http://education.[facility].com/faq/FAQ164)    HDL  Date Value Ref Range Status  06/18/2021 45 > OR = 40 mg/dL Final   Triglycerides  Date Value Ref Range Status  06/18/2021 104 <150 mg/dL Final         Passed - Cr in normal range and within 360 days    Creat  Date Value Ref Range Status  06/18/2021 1.30 0.70 - 1.35 mg/dL Final         Passed - Patient is not pregnant      Passed - Valid encounter within last 12 months    Recent Outpatient Visits          4 months ago Benign essential HTN   [place], Cammie Mcgee, MD   7 months ago [place] Eulogio Bear, NP   8 months ago [place] Eulogio Bear, NP   1 year ago Benign essential HTN   [place], Cammie Mcgee, MD   1 year ago Dermatitis due to plants, including poison ivy, sumac, and oak   [place], Modena Nunnery, MD

## 2021-12-03 ENCOUNTER — Other Ambulatory Visit: Payer: Self-pay | Admitting: Family Medicine

## 2022-01-01 ENCOUNTER — Other Ambulatory Visit: Payer: Self-pay | Admitting: Family Medicine

## 2022-01-01 DIAGNOSIS — I10 Essential (primary) hypertension: Secondary | ICD-10-CM

## 2022-01-02 NOTE — Telephone Encounter (Signed)
Requested Prescriptions  Pending Prescriptions Disp Refills  . levocetirizine (XYZAL) 5 MG tablet [Pharmacy Med Name: Levocetirizine Dihydrochloride 5 MG Oral Tablet] 120 tablet 0    Sig: TAKE 1 TABLET BY MOUTH IN THE  EVENING     Ear, Nose, and Throat:  Antihistamines - levocetirizine dihydrochloride Passed - 01/01/2022  3:12 PM      Passed - Cr in normal range and within 360 days    Creat  Date Value Ref Range Status  06/18/2021 1.30 0.70 - 1.35 mg/dL Final         Passed - eGFR is 10 or above and within 360 days    GFR, Est African American  Date Value Ref Range Status  06/15/2020 105 > OR = 60 mL/min/1.30m Final   GFR, Est Non African American  Date Value Ref Range Status  06/15/2020 90 > OR = 60 mL/min/1.731mFinal   eGFR  Date Value Ref Range Status  06/18/2021 59 (L) > OR = 60 mL/min/1.7334minal    Comment:    The eGFR is based on the CKD-EPI 2021 equation. To calculate  the new eGFR from a previous Creatinine or Cystatin C result, go to https://www.kidney.org/professionals/ kdoqi/gfr%5Fcalculator          Passed - Valid encounter within last 12 months    Recent Outpatient Visits          6 months ago Benign essential HTN   [place]   9 months ago [place], NP   10 months ago [place]ulogio BearP   1 year ago Benign essential HTN   [place]   2 years ago Dermatitis due to plants, including poison ivy, sumac, and oak   [place] Nunnery[place]             . doxazosin (CARDURA) 4 MG tablet [Pharmacy Med Name: Doxazosin Mesylate 4 MG Oral Tablet] 120 tablet 0    Sig: TAKE 1 TABLET BY MOUTH  DAILY     Cardiovascular:  Alpha Blockers Failed - 01/01/2022  3:12 PM      Failed - Valid encounter within last 6 months    Recent Outpatient Visits          6 months ago  Benign essential HTN   [place] Frizzle[place]   9 months ago [place]ulogio BearP   10 months ago [place]ulogio BearP   1 year ago Benign essential HTN   [place] McgeeD   2 years ago Dermatitis due to plants, including poison ivy, sumac, and oak   [place] NunneryD             Passed - Last BP in normal range    BP Readings from Last 1 Encounters:  06/18/21 138/78         . pantoprazole (PROTONIX) 40 MG tablet [Pharmacy Med Name: Pantoprazole Sodium 40 MG Oral Tablet Delayed Release] 120 tablet 0    Sig: TAKE 1 TABLET BY MOUTH  DAILY     Gastroenterology: Proton Pump Inhibitors Passed - 01/01/2022  3:12 PM      Passed - Valid encounter within last  12 months    Recent Outpatient Visits          6 months ago Benign essential HTN   [place] Pickard, Cammie Mcgee, MD   9 months ago [place], NP   10 months ago [place] Eulogio Bear, NP   1 year ago Benign essential HTN   [place]enn[place] Schaumann, Cammie Mcgee, MD   2 years ago Dermatitis due to plants, including poison ivy, sumac, and oak   [place], Modena Nunnery, MD             . amLODipine ([place]) 10 MG tablet [Pharmacy Med Name: amLODIPine Besylate 10 MG Oral Tablet] 120 tablet 0    Sig: TAKE 1 TABLET BY MOUTH  DAILY     Cardiovascular: Calcium Channel Blockers 2 Failed - 01/01/2022  3:12 PM      Failed - Valid encounter within last 6 months    Recent Outpatient Visits          6 months ago Benign essential HTN   [place] Susy Frizzle, MD   9 months ago [place], Jessica A, NP   10 months ago [place] Eulogio Bear,  NP   1 year ago Benign essential HTN   [place] Pickard, Cammie Mcgee, MD   2 years ago Dermatitis due to plants, including poison ivy, sumac, and oak   [place], Modena Nunnery, MD             Passed - Last BP in normal range    BP Readings from Last 1 Encounters:  06/18/21 138/78         Passed - Last Heart Rate in normal range    Pulse Readings from Last 1 Encounters:  06/18/21 68

## 2022-01-02 NOTE — Telephone Encounter (Addendum)
Message left asking pt if he would like all 4 of these rx to  come from mail order. Two of these rx are already at local pharm. Can send all to mail order or leave the two here, whatever pt wants.

## 2022-02-07 ENCOUNTER — Ambulatory Visit (INDEPENDENT_AMBULATORY_CARE_PROVIDER_SITE_OTHER): Payer: Medicare Other | Admitting: Family Medicine

## 2022-02-07 ENCOUNTER — Encounter: Payer: Self-pay | Admitting: Family Medicine

## 2022-02-07 VITALS — BP 126/76 | HR 71 | Ht 70.0 in | Wt 184.6 lb

## 2022-02-07 DIAGNOSIS — M7552 Bursitis of left shoulder: Secondary | ICD-10-CM | POA: Diagnosis not present

## 2022-02-07 NOTE — Progress Notes (Signed)
Subjective:    Patient ID: [name], male    DOB: 1950/11/15, 71 y.o.   MRN: [id number]  Shoulder Pain   Rash    Patient reports the gradual onset of pain in his left shoulder over the last several months.  He denies any specific fall or injury.  He states that it is aching at night keeping him awake.  The pain is located in the subacromial space.  He has pain with abduction greater than 90 degrees.  He has some mild pain with internal and external rotation.  However resisted abduction elicits pain.  Empty can testing elicits pain.  Drop test elicits pain.  He has a negative O'Brien sign.  He has a negative Spurling sign.  There is no significant crepitus with range of motion. Past Medical History:  Diagnosis Date   Allergy    wood sap   Allergy history, seafood    oysters, mussels   Arthritis    neck   Bee sting allergy 1980   hives after 20 yellowjacket stings   Cancer ([place])    "skin cancer frozen off"   Colon polyps    q 3 years, last colonoscopy 03/12/18   GERD (gastroesophageal reflux disease)    Hypercholesteremia    Hypertension    Past Surgical History:  Procedure Laterality Date   CARPAL TUNNEL RELEASE Right 1985   elbow   SHOULDER ARTHROSCOPY WITH SUBACROMIAL DECOMPRESSION AND OPEN ROTATOR C Right 10/22/2012   Procedure: RIGHT SHOULDER ARTHROSCOPY WITH SUBACROMIAL DECOMPRESSION AND  ROTATOR CUFF REPAIR,  CORACOACROMIAL LIGAMENT RELEASE AND  MANIPULATION UNDER ANESTHESIA;  Surgeon: Johnn Hai, MD;  Location: [facility];  Service: Orthopedics;  Laterality: Right;   Current Outpatient Medications on File Prior to Visit  Medication Sig Dispense Refill   amLODipine (NORVASC) 10 MG tablet TAKE 1 TABLET BY MOUTH  DAILY 120 tablet 0   doxazosin (CARDURA) 4 MG tablet TAKE 1 TABLET BY MOUTH  DAILY 120 tablet 0   HYDROcodone bit-homatropine (HYCODAN) 5-1.5 MG/5ML syrup Take 5 mLs by mouth every 8 (eight) hours as needed for cough. 120 mL 0   KLOR-CON M20 20 MEQ  tablet TAKE 1 TABLET BY MOUTH EVERY DAY 90 tablet 3   levocetirizine (XYZAL) 5 MG tablet TAKE 1 TABLET BY MOUTH IN THE  EVENING 120 tablet 0   losartan (COZAAR) 100 MG tablet TAKE 1 TABLET BY MOUTH IN  THE MORNING 120 tablet 2   pantoprazole (PROTONIX) 40 MG tablet TAKE 1 TABLET BY MOUTH  DAILY 120 tablet 0   rosuvastatin (CRESTOR) 10 MG tablet TAKE 1 TABLET BY MOUTH  DAILY 90 tablet 2   zafirlukast (ACCOLATE) 20 MG tablet TAKE 1 TABLET BY MOUTH TWICE A DAY BEFORE MEALS 180 tablet 3   Testosterone 20.25 MG/ACT (1.62%) GEL Place 2 Pump onto the skin daily. (Patient not taking: Reported on 02/07/2022) 75 g 3   No current facility-administered medications on file prior to visit.   Allergies  Allergen Reactions   Iodine     Topical antiseptic-as a young adult. Don't know reaction   Other     Wood Sap causes Hives   Social History   Socioeconomic History   Marital status: Married    Spouse name: Not on file   Number of children: Not on file   Years of education: Not on file   Highest education level: Not on file  Occupational History   Not on file  Tobacco Use   Smoking  status: Former    Packs/day: 1.00    Years: 15.00    Total pack years: 15.00    Types: Cigarettes    Quit date: 03/04/1984    Years since quitting: 37.9   Smokeless tobacco: Former    Types: Chew    Quit date: 03/04/2008  Substance and Sexual Activity   Alcohol use: No   Drug use: No   Sexual activity: Not on file  Other Topics Concern   Not on file  Social History Narrative   Not on file   Social Determinants of Health   Financial Resource Strain: Not on file  Food Insecurity: Not on file  Transportation Needs: Not on file  Physical Activity: Not on file  Stress: Not on file  Social Connections: Not on file  Intimate Partner Violence: Not on file     Review of Systems  Skin:  Positive for rash.  All other systems reviewed and are negative.     Objective:   Physical Exam Constitutional:       Appearance: Normal appearance. He is normal weight.  Cardiovascular:     Rate and Rhythm: Normal rate and regular rhythm.     Heart sounds: Normal heart sounds. No murmur heard.    No friction rub.  Pulmonary:     Effort: Pulmonary effort is normal. No respiratory distress.     Breath sounds: Normal breath sounds. No wheezing, rhonchi or rales.  Musculoskeletal:     Left shoulder: No deformity, effusion, tenderness or bony tenderness. Decreased range of motion. Normal strength.  Neurological:     Mental Status: He is alert.          Assessment & Plan:   Subacromial bursitis of left shoulder joint I believe the patient has subacromial bursitis in his left shoulder although I cannot exclude a partial tear of the supraspinatus.  We discussed treatment options and he requests a cortisone injection.  Using sterile technique, I injected the left subacromial space with 2 cc of lidocaine, 2 cc of Marcaine, and 2 cc of 40 mg/mL Kenalog.  He tolerated the procedure well without complication.  I will arrange physical therapy.  If worsening we will proceed with imaging of the left shoulder.  While the patient was here today also treated a pink scaly hard papule on his left forehead with liquid nitrogen cryotherapy for 30 seconds.  If the lesion persist he will require biopsy

## 2022-02-22 ENCOUNTER — Ambulatory Visit (INDEPENDENT_AMBULATORY_CARE_PROVIDER_SITE_OTHER): Payer: Medicare Other | Admitting: Rehabilitative and Restorative Service Providers"

## 2022-02-22 ENCOUNTER — Encounter: Payer: Self-pay | Admitting: Rehabilitative and Restorative Service Providers"

## 2022-02-22 DIAGNOSIS — M25612 Stiffness of left shoulder, not elsewhere classified: Secondary | ICD-10-CM

## 2022-02-22 DIAGNOSIS — M25512 Pain in left shoulder: Secondary | ICD-10-CM

## 2022-02-22 DIAGNOSIS — M6281 Muscle weakness (generalized): Secondary | ICD-10-CM | POA: Diagnosis not present

## 2022-02-22 NOTE — Therapy (Signed)
OUTPATIENT PHYSICAL THERAPY SHOULDER EVALUATION   Patient Name: [name] MRN: [id number] DOB:05-Jul-1950, 71 y.o., male Today's Date: 02/22/2022  END OF SESSION:  PT End of Session - 02/22/22 1549     Visit Number 1    Number of Visits 12    PT Start Time 0388    PT Stop Time 1100    PT Time Calculation (min) 45 min    Activity Tolerance Patient tolerated treatment well;No increased pain    Behavior During Therapy WFL for tasks assessed/performed             Past Medical History:  Diagnosis Date   Allergy    wood sap   Allergy history, seafood    oysters, mussels   Arthritis    neck   Bee sting allergy 1980   hives after 20 yellowjacket stings   Cancer ([place])    "skin cancer frozen off"   Colon polyps    q 3 years, last colonoscopy 03/12/18   GERD (gastroesophageal reflux disease)    Hypercholesteremia    Hypertension    Past Surgical History:  Procedure Laterality Date   CARPAL TUNNEL RELEASE Right 1985   elbow   SHOULDER ARTHROSCOPY WITH SUBACROMIAL DECOMPRESSION AND OPEN ROTATOR C Right 10/22/2012   Procedure: RIGHT SHOULDER ARTHROSCOPY WITH SUBACROMIAL DECOMPRESSION AND  ROTATOR CUFF REPAIR,  CORACOACROMIAL LIGAMENT RELEASE AND  MANIPULATION UNDER ANESTHESIA;  Surgeon: Johnn Hai, MD;  Location: [facility];  Service: Orthopedics;  Laterality: Right;   Patient Active Problem List   Diagnosis Date Noted   Acute allergic reaction 07/26/2017   Colon polyps    HTN (hypertension) 06/18/2012   HLD (hyperlipidemia) 06/18/2012   GERD (gastroesophageal reflux disease)     PCP: Susy Frizzle, MD  REFERRING PROVIDER: Susy Frizzle, MD  REFERRING DIAG:  Diagnosis  M75.52 (ICD-10-CM) - Subacromial bursitis of left shoulder joint    THERAPY DIAG:  Muscle weakness (generalized) - Plan: PT plan of care cert/re-cert  Stiffness of left shoulder, not elsewhere classified - Plan: PT plan of care cert/re-cert  Acute pain of left shoulder - Plan: PT  plan of care cert/re-cert  Rationale for Evaluation and Treatment: Rehabilitation  ONSET DATE: Within the last few months  SUBJECTIVE:                                                                                                                                                                                      SUBJECTIVE STATEMENT: [name] is noticed increasing left shoulder pain over the past few months.  Reaching out and overhead are particularly functionally limiting.  He has had a previous right rotator  cuff repair and would like to avoid the same surgery on the left if at all possible.  PERTINENT HISTORY: Cervical OA, HLD, HTN, previous Rt RTC repair and Rt carpal tunnel release  PAIN:  Are you having pain? Yes: NPRS scale: 2-6/10/10 Pain location: Left shoulder Pain description: Like a "toothache" Aggravating factors: Reaching and overhead function along with sleeping on the left side Relieving factors: Change of position  PRECAUTIONS: None  WEIGHT BEARING RESTRICTIONS: No  FALLS:  Has patient fallen in last 6 months? No  LIVING ENVIRONMENT: Lives with: lives with their spouse Lives in: House/apartment Stairs:  Non problem Has following equipment at home: None  OCCUPATION: Retired, works around [employer], competitive Research officer, trade union  PLOF: Independent  PATIENT GOALS: Return to sleeping on the left side, reaching and overhead function  NEXT MD VISIT:   OBJECTIVE:   DIAGNOSTIC FINDINGS:  Right IMPRESSION:   1. Biceps long head tenosynovitis and tendinopathy.  2.  Supraspinatus and subscapularis tendinopathy with tiny  intrasubstance tears.  No tear extending to the surface of the  tendon or retraction.  3.  Subacromial bursitis.  4.  Mild AC joint osteoarthritis and lateral downsloping of the  acromion predisposing to impingement.  PATIENT SURVEYS:  FOTO 48 (Goal 70 in 14 visits)  COGNITION: Overall cognitive status: Within functional limits for tasks  assessed     SENSATION: [name] had no complaints of peripheral pain or paresthesias  POSTURE: Mild internally rotated and protracted shoulders  UPPER EXTREMITY ROM:   Passive ROM 02/22/22 (L/R in degrees) Left eval  Shoulder flexion 175/175   Shoulder extension    Shoulder abduction    Shoulder horizontal adduction 25/25   Shoulder internal rotation 30/60   Shoulder external rotation 80/100   Elbow flexion    Elbow extension    Wrist flexion    Wrist extension    Wrist ulnar deviation    Wrist radial deviation    Wrist pronation    Wrist supination    (Blank rows = not tested)  UPPER EXTREMITY MMT:  MMT 02/22/22 L/R in pounds Left eval  Shoulder flexion    Shoulder extension    Shoulder abduction    Shoulder adduction    Shoulder internal rotation 25.2/39.6   Shoulder external rotation 23.2/29.4   Middle trapezius    Lower trapezius    Elbow flexion    Elbow extension    Wrist flexion    Wrist extension    Wrist ulnar deviation    Wrist radial deviation    Wrist pronation    Wrist supination    Grip strength (lbs)    (Blank rows = not tested)    TODAY'S TREATMENT:                                                                                                                                         DATE: 02/22/2022  Supine scapular protraction with 3 pound weights 20 x 3 seconds Shoulder blade pinches 10 x 5 seconds Supine shoulder IR stretch 20 x 10 seconds  Functional activities: Reviewed anatomy of the shoulder using the shoulder model; discussed bursitis, tendinitis and tear progressions; discussed biomechanics of the shoulder and the importance of restoring normal capsular flexibility, scapular and rotator cuff strength   PATIENT EDUCATION: Education details: See above Person educated: Patient Education method: Explanation, Demonstration, Tactile cues, Verbal cues, and Handouts Education comprehension: verbalized understanding, returned demonstration,  verbal cues required, tactile cues required, and needs further education  HOME EXERCISE PROGRAM: Access Code: [email or web address] URL: [email or web address] Date: 02/22/2022 Prepared by: Vista Mink  Exercises - Supine Scapular Protraction in Flexion with Dumbbells  - 2 x daily - 7 x weekly - 1 sets - 20 reps - 3 seconds hold - Supine Shoulder Internal Rotation Stretch  - 2 x daily - 7 x weekly - 1 sets - 20 reps - 10 seconds hold - Standing Scapular Retraction  - 5 x daily - 7 x weekly - 1 sets - 5 reps - 5 second hold  ASSESSMENT:  CLINICAL IMPRESSION: Patient is a 71 y.o. male who was seen today for physical therapy evaluation and treatment for  Diagnosis  M75.52 (ICD-10-CM) - Subacromial bursitis of left shoulder joint  .  [name] had a rotator cuff repair in 2014 and would like to do everything possible to avoid the same surgery on the left.  He does have significant posterior capsular tightness and some weakness in the scapular and rotator cuff muscles.  Correcting this gives him a great chance to reduce impingement and hopefully avoid surgery.  OBJECTIVE IMPAIRMENTS: decreased activity tolerance, decreased endurance, decreased knowledge of condition, decreased ROM, decreased strength, decreased safety awareness, impaired perceived functional ability, impaired flexibility, impaired UE functional use, and pain.   ACTIVITY LIMITATIONS: carrying, lifting, sleeping, and reach over head  PARTICIPATION LIMITATIONS: community activity  PERSONAL FACTORS: Cervical OA, HLD, HTN, previous Rt RTC repair and Rt carpal tunnel release are also affecting patient's functional outcome.   REHAB POTENTIAL: Good  CLINICAL DECISION MAKING: Stable/uncomplicated  EVALUATION COMPLEXITY: Low   GOALS: Goals reviewed with patient? Yes  SHORT TERM GOALS: Target date: 03/22/2022  Improve left shoulder IR AROM to 60 degrees and horizontal adduction to 40 degrees Baseline: 30 degrees and 25 degrees  respectively Goal status: INITIAL  2.  [name] will be independent with his day 1 home exercise program Baseline: Started 02/22/2002 Goal status: INITIAL   LONG TERM GOALS: Target date: 04/19/2022  Improve FOTO to 70 Baseline: 48 Goal status: INITIAL  2.  Improve left shoulder pain to consistently 0-2 out of 10 on the visual analog scale Baseline: 2-6 out of 10 Goal status: INITIAL  3.  Improve left shoulder strength by at least 10% Baseline: See objective Goal status: INITIAL  4.  [name] will be able to perform reaching and overhead activities without increase in pain Baseline: Difficulty with reaching and overhead function at evaluation Goal status: INITIAL  5.  [name] will be independent with his comprehensive program exercise program at discharge Baseline: Started 02/22/2022 Goal status: INITIAL  PLAN:  PT FREQUENCY: 1-2x/week  PT DURATION: 8 weeks  PLANNED INTERVENTIONS: Therapeutic exercises, Therapeutic activity, Neuromuscular re-education, Patient/Family education, Self Care, Joint mobilization, Cryotherapy, and Manual therapy  PLAN FOR NEXT SESSION: Review day 1 home exercise program.  Progress posterior capsular stretching including thumb up the back and horizontal adduction stretch.  Progress scapular strengthening and  Thera-Band for IR and ER strength.   Farley Ly, PT, MPT 02/22/2022, 4:03 PM

## 2022-03-06 ENCOUNTER — Ambulatory Visit (INDEPENDENT_AMBULATORY_CARE_PROVIDER_SITE_OTHER): Payer: Medicare Other | Admitting: Physical Therapy

## 2022-03-06 ENCOUNTER — Encounter: Payer: Self-pay | Admitting: Physical Therapy

## 2022-03-06 DIAGNOSIS — M25612 Stiffness of left shoulder, not elsewhere classified: Secondary | ICD-10-CM | POA: Diagnosis not present

## 2022-03-06 DIAGNOSIS — M6281 Muscle weakness (generalized): Secondary | ICD-10-CM

## 2022-03-06 DIAGNOSIS — M25512 Pain in left shoulder: Secondary | ICD-10-CM

## 2022-03-06 NOTE — Therapy (Signed)
OUTPATIENT PHYSICAL THERAPY SHOULDER TREATMENT   Patient Name: [name] MRN: [id number] DOB:Mar 27, 1950, 72 y.o., male Today's Date: 03/06/2022  END OF SESSION:  PT End of Session - 03/06/22 1513     Visit Number 2    Number of Visits 12    PT Start Time 0962    PT Stop Time 8366    PT Time Calculation (min) 40 min    Activity Tolerance Patient tolerated treatment well;No increased pain    Behavior During Therapy WFL for tasks assessed/performed              Past Medical History:  Diagnosis Date   Allergy    wood sap   Allergy history, seafood    oysters, mussels   Arthritis    neck   Bee sting allergy 1980   hives after 20 yellowjacket stings   Cancer ([place])    "skin cancer frozen off"   Colon polyps    q 3 years, last colonoscopy 03/12/18   GERD (gastroesophageal reflux disease)    Hypercholesteremia    Hypertension    Past Surgical History:  Procedure Laterality Date   CARPAL TUNNEL RELEASE Right 1985   elbow   SHOULDER ARTHROSCOPY WITH SUBACROMIAL DECOMPRESSION AND OPEN ROTATOR C Right 10/22/2012   Procedure: RIGHT SHOULDER ARTHROSCOPY WITH SUBACROMIAL DECOMPRESSION AND  ROTATOR CUFF REPAIR,  CORACOACROMIAL LIGAMENT RELEASE AND  MANIPULATION UNDER ANESTHESIA;  Surgeon: Johnn Hai, MD;  Location: [facility];  Service: Orthopedics;  Laterality: Right;   Patient Active Problem List   Diagnosis Date Noted   Acute allergic reaction 07/26/2017   Colon polyps    HTN (hypertension) 06/18/2012   HLD (hyperlipidemia) 06/18/2012   GERD (gastroesophageal reflux disease)     PCP: Susy Frizzle, MD  REFERRING PROVIDER: Susy Frizzle, MD  REFERRING DIAG:  Diagnosis  M75.52 (ICD-10-CM) - Subacromial bursitis of left shoulder joint    THERAPY DIAG:  Muscle weakness (generalized)  Acute pain of left shoulder  Stiffness of left shoulder, not elsewhere classified  Rationale for Evaluation and Treatment: Rehabilitation  ONSET DATE: Within the  last few months  SUBJECTIVE:                                                                                                                                                                                      SUBJECTIVE STATEMENT:  Rob gave me 3 exercises last time which I've been doing since last time, it seems to be marginally better in terms of sleeping goes. Slightly better overall. The hardest thing for me to do is pick up something at a distance that has any weight to it.  Still trying to avoid surgery. If I'm not doing something with the shoulder it doesn't bother me.   PERTINENT HISTORY: Cervical OA, HLD, HTN, previous Rt RTC repair and Rt carpal tunnel release  PAIN:  Are you having pain? Yes: NPRS scale: 1-2/10 Pain location: Left shoulder Pain description: discomfort/toothachey when present  Aggravating factors: Reaching and overhead function along with sleeping on the left side Relieving factors: Change of position  PRECAUTIONS: None  WEIGHT BEARING RESTRICTIONS: No  FALLS:  Has patient fallen in last 6 months? No  LIVING ENVIRONMENT: Lives with: lives with their spouse Lives in: House/apartment Stairs:  Non problem Has following equipment at home: None  OCCUPATION: Retired, works around [employer], competitive Research officer, trade union  PLOF: Independent  PATIENT GOALS: Return to sleeping on the left side, reaching and overhead function  NEXT MD VISIT:   OBJECTIVE:   DIAGNOSTIC FINDINGS:  Right IMPRESSION:   1. Biceps long head tenosynovitis and tendinopathy.  2.  Supraspinatus and subscapularis tendinopathy with tiny  intrasubstance tears.  No tear extending to the surface of the  tendon or retraction.  3.  Subacromial bursitis.  4.  Mild AC joint osteoarthritis and lateral downsloping of the  acromion predisposing to impingement.  PATIENT SURVEYS:  FOTO 48 (Goal 70 in 14 visits)  COGNITION: Overall cognitive status: Within functional limits for tasks  assessed     SENSATION: [name] had no complaints of peripheral pain or paresthesias  POSTURE: Mild internally rotated and protracted shoulders  UPPER EXTREMITY ROM:   Passive ROM 02/22/22 (L/R in degrees) Left eval  Shoulder flexion 175/175   Shoulder extension    Shoulder abduction    Shoulder horizontal adduction 25/25   Shoulder internal rotation 30/60   Shoulder external rotation 80/100   Elbow flexion    Elbow extension    Wrist flexion    Wrist extension    Wrist ulnar deviation    Wrist radial deviation    Wrist pronation    Wrist supination    (Blank rows = not tested)  UPPER EXTREMITY MMT:  MMT 02/22/22 L/R in pounds Left eval  Shoulder flexion    Shoulder extension    Shoulder abduction    Shoulder adduction    Shoulder internal rotation 25.2/39.6   Shoulder external rotation 23.2/29.4   Middle trapezius    Lower trapezius    Elbow flexion    Elbow extension    Wrist flexion    Wrist extension    Wrist ulnar deviation    Wrist radial deviation    Wrist pronation    Wrist supination    Grip strength (lbs)    (Blank rows = not tested)    TODAY'S TREATMENT:  DATE:   03/06/22  TherEx  UBE L4 x3 min forward/x3 min backward Serratus punches 3# x12  Supine alphabet x3 rounds with 3# Flexion stability raises with green TB x10/scap retraction Body blade elbow bent horizontal and vertical x30 each 3 way reaches with green TB for stability 2x5 B Body blade with UE straight horizontal and vertical shakes 2x30 seconds Posterior capsule stretch 3x30 seconds L UE ER stretch on doorframe L UE 3x30 seconds      02/22/2022 Supine scapular protraction with 3 pound weights 20 x 3 seconds Shoulder blade pinches 10 x 5 seconds Supine shoulder IR stretch 20 x 10 seconds  Functional activities: Reviewed anatomy of the  shoulder using the shoulder model; discussed bursitis, tendinitis and tear progressions; discussed biomechanics of the shoulder and the importance of restoring normal capsular flexibility, scapular and rotator cuff strength   PATIENT EDUCATION: Education details: exercise form/purpose  Person educated: Patient Education method: Explanation, Demonstration, Tactile cues, Verbal cues, and Handouts Education comprehension: verbalized understanding, returned demonstration, verbal cues required, tactile cues required, and needs further education  HOME EXERCISE PROGRAM: Access Code: [email or web address] URL: [email or web address] Date: 02/22/2022 Prepared by: Vista Mink  Exercises - Supine Scapular Protraction in Flexion with Dumbbells  - 2 x daily - 7 x weekly - 1 sets - 20 reps - 3 seconds hold - Supine Shoulder Internal Rotation Stretch  - 2 x daily - 7 x weekly - 1 sets - 20 reps - 10 seconds hold - Standing Scapular Retraction  - 5 x daily - 7 x weekly - 1 sets - 5 reps - 5 second hold  ASSESSMENT:  CLINICAL IMPRESSION:  [name] arrives today feeling somewhat better, has been doing well with HEP. Warmed up on UBE then we focused on general periscap and rotator cuff strength as well as general shoulder stability and postural training. Did well today and remains motivated to improve, will continue efforts.   OBJECTIVE IMPAIRMENTS: decreased activity tolerance, decreased endurance, decreased knowledge of condition, decreased ROM, decreased strength, decreased safety awareness, impaired perceived functional ability, impaired flexibility, impaired UE functional use, and pain.   ACTIVITY LIMITATIONS: carrying, lifting, sleeping, and reach over head  PARTICIPATION LIMITATIONS: community activity  PERSONAL FACTORS: Cervical OA, HLD, HTN, previous Rt RTC repair and Rt carpal tunnel release are also affecting patient's functional outcome.   REHAB POTENTIAL: Good  CLINICAL DECISION MAKING:  Stable/uncomplicated  EVALUATION COMPLEXITY: Low   GOALS: Goals reviewed with patient? Yes  SHORT TERM GOALS: Target date: 03/22/2022  Improve left shoulder IR AROM to 60 degrees and horizontal adduction to 40 degrees Baseline: 30 degrees and 25 degrees respectively Goal status: INITIAL  2.  [name] will be independent with his day 1 home exercise program Baseline: Started 02/22/2002 Goal status: INITIAL   LONG TERM GOALS: Target date: 04/19/2022  Improve FOTO to 70 Baseline: 48 Goal status: INITIAL  2.  Improve left shoulder pain to consistently 0-2 out of 10 on the visual analog scale Baseline: 2-6 out of 10 Goal status: INITIAL  3.  Improve left shoulder strength by at least 10% Baseline: See objective Goal status: INITIAL  4.  [name] will be able to perform reaching and overhead activities without increase in pain Baseline: Difficulty with reaching and overhead function at evaluation Goal status: INITIAL  5.  [name] will be independent with his comprehensive program exercise program at discharge Baseline: Started 02/22/2022 Goal status: INITIAL  PLAN:  PT FREQUENCY: 1-2x/week  PT DURATION: 8 weeks  PLANNED INTERVENTIONS: Therapeutic  exercises, Therapeutic activity, Neuromuscular re-education, Patient/Family education, Self Care, Joint mobilization, Cryotherapy, and Manual therapy  PLAN FOR NEXT SESSION: Update HEP, Review day 1 home exercise program.  Progress posterior capsular stretching including thumb up the back and horizontal adduction stretch.  Progress scapular strengthening and Thera-Band for IR and ER strength.   Deniece Ree PT DPT PN2  03/06/2022, 3:45 PM

## 2022-03-07 ENCOUNTER — Encounter: Payer: Self-pay | Admitting: Rehabilitative and Restorative Service Providers"

## 2022-03-07 ENCOUNTER — Ambulatory Visit (INDEPENDENT_AMBULATORY_CARE_PROVIDER_SITE_OTHER): Payer: Medicare Other | Admitting: Rehabilitative and Restorative Service Providers"

## 2022-03-07 DIAGNOSIS — M25512 Pain in left shoulder: Secondary | ICD-10-CM | POA: Diagnosis not present

## 2022-03-07 DIAGNOSIS — M25612 Stiffness of left shoulder, not elsewhere classified: Secondary | ICD-10-CM

## 2022-03-07 DIAGNOSIS — M6281 Muscle weakness (generalized): Secondary | ICD-10-CM

## 2022-03-07 NOTE — Therapy (Signed)
OUTPATIENT PHYSICAL THERAPY SHOULDER TREATMENT   Patient Name: [name] MRN: [id number] DOB:[birth year], 72 y.o., male Today's Date: 03/07/2022  END OF SESSION:  PT End of Session - 03/07/22 1102     Visit Number 3    Number of Visits 12    PT Start Time 1101    PT Stop Time 1141    PT Time Calculation (min) 40 min    Activity Tolerance Patient tolerated treatment well;No increased pain    Behavior During Therapy WFL for tasks assessed/performed             Past Medical History:  Diagnosis Date   Allergy    wood sap   Allergy history, seafood    oysters, mussels   Arthritis    neck   Bee sting allergy 1980   hives after 20 yellowjacket stings   Cancer ([place])    "skin cancer frozen off"   Colon polyps    q 3 years, last colonoscopy 03/12/18   GERD (gastroesophageal reflux disease)    Hypercholesteremia    Hypertension    Past Surgical History:  Procedure Laterality Date   CARPAL TUNNEL RELEASE Right 1985   elbow   SHOULDER ARTHROSCOPY WITH SUBACROMIAL DECOMPRESSION AND OPEN ROTATOR C Right 10/22/2012   Procedure: RIGHT SHOULDER ARTHROSCOPY WITH SUBACROMIAL DECOMPRESSION AND  ROTATOR CUFF REPAIR,  CORACOACROMIAL LIGAMENT RELEASE AND  MANIPULATION UNDER ANESTHESIA;  Surgeon: Dale Hai, MD;  Location: [facility];  Service: Orthopedics;  Laterality: Right;   Patient Active Problem List   Diagnosis Date Noted   Acute allergic reaction 07/26/2017   Colon polyps    HTN (hypertension) 06/18/2012   HLD (hyperlipidemia) 06/18/2012   GERD (gastroesophageal reflux disease)     PCP: Dale Frizzle, MD  REFERRING PROVIDER: Susy Frizzle, MD  REFERRING DIAG:  Diagnosis  M75.52 (ICD-10-CM) - Subacromial bursitis of left shoulder joint    THERAPY DIAG:  Muscle weakness (generalized)  Acute pain of left shoulder  Stiffness of left shoulder, not elsewhere classified  Rationale for Evaluation and Treatment: Rehabilitation  ONSET DATE: Within the last  few months  SUBJECTIVE:                                                                                                                                                                                      SUBJECTIVE STATEMENT:  [name] reports he can now sleep on his left side.  He is painfree at times but can still get a sharp pain when lifting.  PERTINENT HISTORY: Cervical OA, HLD, HTN, previous Rt RTC repair and Rt carpal tunnel release  PAIN:  Are you having pain? Yes:  NPRS scale: 0-5/10 Pain location: Left shoulder Pain description: discomfort/toothachey is better, can get a sharp pain with lifting or heavy use  Aggravating factors: Reaching and overhead function along with sleeping on the left side (improved since day 1) Relieving factors: Change of position  PRECAUTIONS: None  WEIGHT BEARING RESTRICTIONS: No  FALLS:  Has patient fallen in last 6 months? No  LIVING ENVIRONMENT: Lives with: lives with their spouse Lives in: House/apartment Stairs:  Non problem Has following equipment at home: None  OCCUPATION: Retired, works around [employer], competitive Research officer, trade union  PLOF: Independent  PATIENT GOALS: Return to sleeping on the left side, reaching and overhead function  NEXT MD VISIT:   OBJECTIVE:   DIAGNOSTIC FINDINGS:  Right IMPRESSION:   1. Biceps long head tenosynovitis and tendinopathy.  2.  Supraspinatus and subscapularis tendinopathy with tiny  intrasubstance tears.  No tear extending to the surface of the  tendon or retraction.  3.  Subacromial bursitis.  4.  Mild AC joint osteoarthritis and lateral downsloping of the  acromion predisposing to impingement.  PATIENT SURVEYS:  FOTO 48 (Goal 70 in 14 visits)  COGNITION: Overall cognitive status: Within functional limits for tasks assessed     SENSATION: [name] had no complaints of peripheral pain or paresthesias  POSTURE: Mild internally rotated and protracted shoulders  UPPER EXTREMITY ROM:    Passive ROM 02/22/22 (L/R in degrees) 03/07/2022 Left in degrees  Shoulder flexion 175/175 175  Shoulder extension    Shoulder abduction    Shoulder horizontal adduction 25/25 30  Shoulder internal rotation 30/60 60  Shoulder external rotation 80/100 80  Elbow flexion    Elbow extension    Wrist flexion    Wrist extension    Wrist ulnar deviation    Wrist radial deviation    Wrist pronation    Wrist supination    (Blank rows = not tested)  UPPER EXTREMITY MMT:  MMT 02/22/22 L/R in pounds Left eval  Shoulder flexion    Shoulder extension    Shoulder abduction    Shoulder adduction    Shoulder internal rotation 25.2/39.6   Shoulder external rotation 23.2/29.4   Middle trapezius    Lower trapezius    Elbow flexion    Elbow extension    Wrist flexion    Wrist extension    Wrist ulnar deviation    Wrist radial deviation    Wrist pronation    Wrist supination    Grip strength (lbs)    (Blank rows = not tested)    TODAY'S TREATMENT:                                                                                                                                         DATE:  03/07/2021 Supine scapular protraction with 4 pound weights 20 x 3 seconds Shoulder blade pinches 10 x 5 seconds Supine shoulder IR stretch 10  x 10 seconds Posterior capsule stretch 10X 10 seconds Theraband ER/IR Red 2 sets of 10X each slow eccentrics Reassess AROM and make appropriate changes to his HEP   03/06/22 TherEx  UBE L4 x3 min forward/x3 min backward Serratus punches 3# x12  Supine alphabet x3 rounds with 3# Flexion stability raises with green TB x10/scap retraction Body blade elbow bent horizontal and vertical x30 each 3 way reaches with green TB for stability 2x5 B Body blade with UE straight horizontal and vertical shakes 2x30 seconds Posterior capsule stretch 3x30 seconds L UE ER stretch on doorframe L UE 3x30 seconds   02/22/2022 Supine scapular protraction with 3 pound  weights 20 x 3 seconds Shoulder blade pinches 10 x 5 seconds Supine shoulder IR stretch 20 x 10 seconds  Functional activities: Reviewed anatomy of the shoulder using the shoulder model; discussed bursitis, tendinitis and tear progressions; discussed biomechanics of the shoulder and the importance of restoring normal capsular flexibility, scapular and rotator cuff strength   PATIENT EDUCATION: Education details: exercise form/purpose  Person educated: Patient Education method: Explanation, Demonstration, Tactile cues, Verbal cues, and Handouts Education comprehension: verbalized understanding, returned demonstration, verbal cues required, tactile cues required, and needs further education  HOME EXERCISE PROGRAM: Access Code: [email or web address] URL: [email or web address] Date: 03/07/2022 Prepared by: Vista Mink  Exercises - Supine Scapular Protraction in Flexion with Dumbbells  - 1 x daily - 7 x weekly - 1 sets - 20 reps - 3 seconds hold - Supine Shoulder Internal Rotation Stretch  - 1 x daily - 7 x weekly - 1 sets - 20 reps - 10 seconds hold - Standing Scapular Retraction  - 5 x daily - 7 x weekly - 1 sets - 5 reps - 5 second hold - Shoulder External Rotation with Anchored Resistance  - 1 x daily - 7 x weekly - 2 sets - 10 reps - 3 hold - Shoulder Internal Rotation with Resistance  - 1 x daily - 7 x weekly - 2 sets - 10 reps - 3 hold - Standing Shoulder Posterior Capsule Stretch  - 1-2 x daily - 7 x weekly - 1 sets - 10 reps - 10 seconds hold  ASSESSMENT:  CLINICAL IMPRESSION: [name] is moving significantly better with his left shoulder internal rotation active range of motion as compared to evaluation.  He still has some posterior capsular tightness and a little restriction into external rotation.  Otherwise, impairments are more strength related and this will be a focus of continued work with [facility].  He is on track to meet long-term goals.   OBJECTIVE  IMPAIRMENTS: decreased activity tolerance, decreased endurance, decreased knowledge of condition, decreased ROM, decreased strength, decreased safety awareness, impaired perceived functional ability, impaired flexibility, impaired UE functional use, and pain.   ACTIVITY LIMITATIONS: carrying, lifting, sleeping, and reach over head  PARTICIPATION LIMITATIONS: community activity  PERSONAL FACTORS: Cervical OA, HLD, HTN, previous Rt RTC repair and Rt carpal tunnel release are also affecting patient's functional outcome.   REHAB POTENTIAL: Good  CLINICAL DECISION MAKING: Stable/uncomplicated  EVALUATION COMPLEXITY: Low   GOALS: Goals reviewed with patient? Yes  SHORT TERM GOALS: Target date: 03/22/2022  Improve left shoulder IR AROM to 60 degrees and horizontal adduction to 40 degrees Baseline: 30 degrees and 25 degrees respectively Goal status: Partially Met 03/07/2022  2.  [name] will be independent with his day 1 home exercise program Baseline: Started 02/22/2002 Goal status: Met 03/07/2022   LONG TERM GOALS: Target date: 04/19/2022  Improve FOTO to 70 Baseline: 48 Goal status: INITIAL  2.  Improve left shoulder pain to consistently 0-2 out of 10 on the visual analog scale Baseline: 2-6 out of 10 Goal status: On Going 03/07/2022  3.  Improve left shoulder strength by at least 10% Baseline: See objective Goal status: INITIAL  4.  [name] will be able to perform reaching and overhead activities without increase in pain Baseline: Difficulty with reaching and overhead function at evaluation Goal status: On Going 03/07/2022  5.  [name] will be independent with his comprehensive program exercise program at discharge Baseline: Started 02/22/2022 Goal status: On Going 03/07/2022  PLAN:  PT FREQUENCY: 1-2x/week  PT DURATION: 8 weeks  PLANNED INTERVENTIONS: Therapeutic exercises, Therapeutic activity, Neuromuscular re-education, Patient/Family education, Self Care, Joint mobilization,  Cryotherapy, and Manual therapy  PLAN FOR NEXT SESSION: Progress scapular and rotator strengthening as appropriate.  Maybe some ER stretching in the clinic although this is probably not necessary for his home exercise program.   Farley Ly PT, MPT 03/07/2022, 4:00 PM

## 2022-03-14 ENCOUNTER — Ambulatory Visit (INDEPENDENT_AMBULATORY_CARE_PROVIDER_SITE_OTHER): Payer: Medicare Other | Admitting: Rehabilitative and Restorative Service Providers"

## 2022-03-14 ENCOUNTER — Encounter: Payer: Self-pay | Admitting: Rehabilitative and Restorative Service Providers"

## 2022-03-14 DIAGNOSIS — M25512 Pain in left shoulder: Secondary | ICD-10-CM

## 2022-03-14 DIAGNOSIS — M25612 Stiffness of left shoulder, not elsewhere classified: Secondary | ICD-10-CM | POA: Diagnosis not present

## 2022-03-14 DIAGNOSIS — M6281 Muscle weakness (generalized): Secondary | ICD-10-CM

## 2022-03-14 NOTE — Therapy (Signed)
OUTPATIENT PHYSICAL THERAPY SHOULDER TREATMENT   Patient Name: [name] MRN: [id number] DOB:Jul 13, 1950, 72 y.o., male Today's Date: 03/14/2022  END OF SESSION:  PT End of Session - 03/14/22 1059     Visit Number 4    Number of Visits 12    PT Start Time 7026    PT Stop Time 1058    PT Time Calculation (min) 40 min    Activity Tolerance Patient tolerated treatment well;No increased pain    Behavior During Therapy WFL for tasks assessed/performed              Past Medical History:  Diagnosis Date   Allergy    wood sap   Allergy history, seafood    oysters, mussels   Arthritis    neck   Bee sting allergy 1980   hives after 20 yellowjacket stings   Cancer ([place])    "skin cancer frozen off"   Colon polyps    q 3 years, last colonoscopy 03/12/18   GERD (gastroesophageal reflux disease)    Hypercholesteremia    Hypertension    Past Surgical History:  Procedure Laterality Date   CARPAL TUNNEL RELEASE Right 1985   elbow   SHOULDER ARTHROSCOPY WITH SUBACROMIAL DECOMPRESSION AND OPEN ROTATOR C Right 10/22/2012   Procedure: RIGHT SHOULDER ARTHROSCOPY WITH SUBACROMIAL DECOMPRESSION AND  ROTATOR CUFF REPAIR,  CORACOACROMIAL LIGAMENT RELEASE AND  MANIPULATION UNDER ANESTHESIA;  Surgeon: Dale Hai, MD;  Location: [facility];  Service: Orthopedics;  Laterality: Right;   Patient Active Problem List   Diagnosis Date Noted   Acute allergic reaction 07/26/2017   Colon polyps    HTN (hypertension) 06/18/2012   HLD (hyperlipidemia) 06/18/2012   GERD (gastroesophageal reflux disease)     PCP: Dale Frizzle, MD  REFERRING PROVIDER: Susy Frizzle, MD  REFERRING DIAG:  Diagnosis  M75.52 (ICD-10-CM) - Subacromial bursitis of left shoulder joint    THERAPY DIAG:  Muscle weakness (generalized)  Acute pain of left shoulder  Stiffness of left shoulder, not elsewhere classified  Rationale for Evaluation and Treatment: Rehabilitation  ONSET DATE: Within the  last few months  SUBJECTIVE:                                                                                                                                                                                      SUBJECTIVE STATEMENT:  [name] reports he is doing better with his left shoulder than he was at evaluation.  He did a lot of physical work this week and held up well, but he can still get a sharp pain when lifting.  PERTINENT HISTORY: Cervical OA, HLD, HTN, previous Rt  RTC repair and Rt carpal tunnel release  PAIN:  Are you having pain? Yes: NPRS scale: 0-3/10 Pain location: Left shoulder Pain description: discomfort/toothachey is better, can get a sharp pain with lifting or heavy use  Aggravating factors: Reaching and overhead function along with sleeping on the left side (improved since day 1) Relieving factors: Change of position  PRECAUTIONS: None  WEIGHT BEARING RESTRICTIONS: No  FALLS:  Has patient fallen in last 6 months? No  LIVING ENVIRONMENT: Lives with: lives with their spouse Lives in: House/apartment Stairs:  Non problem Has following equipment at home: None  OCCUPATION: Retired, works around [employer], competitive Research officer, trade union  PLOF: Independent  PATIENT GOALS: Return to sleeping on the left side, reaching and overhead function  NEXT MD VISIT:   OBJECTIVE:   DIAGNOSTIC FINDINGS:  Right IMPRESSION:   1. Biceps long head tenosynovitis and tendinopathy.  2.  Supraspinatus and subscapularis tendinopathy with tiny  intrasubstance tears.  No tear extending to the surface of the  tendon or retraction.  3.  Subacromial bursitis.  4.  Mild AC joint osteoarthritis and lateral downsloping of the  acromion predisposing to impingement.  PATIENT SURVEYS:  03/14/2022 FOTO 63  Eval FOTO 48 (Goal 70 in 14 visits)  COGNITION: Overall cognitive status: Within functional limits for tasks assessed     SENSATION: [name] had no complaints of peripheral pain or  paresthesias  POSTURE: Mild internally rotated and protracted shoulders  UPPER EXTREMITY ROM:   Passive ROM 02/22/22 (L/R in degrees) 03/07/2022 Left in degrees 03/14/2022 Left in degrees  Shoulder flexion 175/175 175 180  Shoulder extension     Shoulder abduction     Shoulder horizontal adduction 25/25 30 35  Shoulder internal rotation 30/60 60 65  Shoulder external rotation 80/100 80 85  Elbow flexion     Elbow extension     Wrist flexion     Wrist extension     Wrist ulnar deviation     Wrist radial deviation     Wrist pronation     Wrist supination     (Blank rows = not tested)  UPPER EXTREMITY MMT:  MMT 02/22/22 L/R in pounds Left In pounds 03/14/2022  Shoulder flexion    Shoulder extension    Shoulder abduction    Shoulder adduction    Shoulder internal rotation 25.2/39.6 25.2  Shoulder external rotation 23.2/29.4 26.0  Middle trapezius    Lower trapezius    Elbow flexion    Elbow extension    Wrist flexion    Wrist extension    Wrist ulnar deviation    Wrist radial deviation    Wrist pronation    Wrist supination    Grip strength (lbs)    (Blank rows = not tested)    TODAY'S TREATMENT:  DATE:  03/14/2022 Supine scapular protraction with 5 pound weights 20 x 3 seconds Shoulder blade pinches 10 x 5 seconds Supine shoulder IR stretch 10 x 10 seconds Posterior capsule stretch 10X 10 seconds Theraband ER/IR Green 2 sets of 10X each slow eccentrics Reassess, FOTO and make appropriate changes to his HEP   03/07/2022 Supine scapular protraction with 4 pound weights 20 x 3 seconds Shoulder blade pinches 10 x 5 seconds Supine shoulder IR stretch 10 x 10 seconds Posterior capsule stretch 10X 10 seconds Theraband ER/IR Red 2 sets of 10X each slow eccentrics Reassess AROM and make appropriate changes to his  HEP   03/06/22 TherEx  UBE L4 x3 min forward/x3 min backward Serratus punches 3# x12  Supine alphabet x3 rounds with 3# Flexion stability raises with green TB x10/scap retraction Body blade elbow bent horizontal and vertical x30 each 3 way reaches with green TB for stability 2x5 B Body blade with UE straight horizontal and vertical shakes 2x30 seconds Posterior capsule stretch 3x30 seconds L UE ER stretch on doorframe L UE 3x30 seconds   PATIENT EDUCATION: Education details: exercise form/purpose  Person educated: Patient Education method: Consulting civil engineer, Demonstration, Tactile cues, Verbal cues, and Handouts Education comprehension: verbalized understanding, returned demonstration, verbal cues required, tactile cues required, and needs further education  HOME EXERCISE PROGRAM: Access Code: [email or web address] URL: [email or web address] Date: 03/14/2022 Prepared by: Vista Mink  Exercises - Supine Scapular Protraction in Flexion with Dumbbells  - 1 x daily - 7 x weekly - 1 sets - 20 reps - 3 seconds hold - Supine Shoulder Internal Rotation Stretch  - 1 x daily - 7 x weekly - 1 sets - 20 reps - 10 seconds hold - Standing Scapular Retraction  - 5 x daily - 7 x weekly - 1 sets - 5 reps - 5 second hold - Shoulder External Rotation with Anchored Resistance  - 1 x daily - 7 x weekly - 2 sets - 10 reps - 3 hold - Shoulder Internal Rotation with Resistance  - 1 x daily - 7 x weekly - 2 sets - 10 reps - 3 hold - Standing Shoulder Posterior Capsule Stretch  - 1 x daily - 7 x weekly - 1 sets - 10 reps - 10 seconds hold  ASSESSMENT:  CLINICAL IMPRESSION: [name] is making very good overall objective and functional progress with his supervised PT.  He still has a bit of posterior capsule tightness, scapular and rotator cuff strength weakness.  All are being addressed with his current program.  He will follow-up in 2 weeks with possible DC at that time if all goes well with his updated  HEP.   OBJECTIVE IMPAIRMENTS: decreased activity tolerance, decreased endurance, decreased knowledge of condition, decreased ROM, decreased strength, decreased safety awareness, impaired perceived functional ability, impaired flexibility, impaired UE functional use, and pain.   ACTIVITY LIMITATIONS: carrying, lifting, sleeping, and reach over head  PARTICIPATION LIMITATIONS: community activity  PERSONAL FACTORS: Cervical OA, HLD, HTN, previous Rt RTC repair and Rt carpal tunnel release are also affecting patient's functional outcome.   REHAB POTENTIAL: Good  CLINICAL DECISION MAKING: Stable/uncomplicated  EVALUATION COMPLEXITY: Low   GOALS: Goals reviewed with patient? Yes  SHORT TERM GOALS: Target date: 03/22/2022  Improve left shoulder IR AROM to 60 degrees and horizontal adduction to 40 degrees Baseline: 30 degrees and 25 degrees respectively Goal status: Partially Met 03/14/2022  2.  [name] will be independent with his day 1 home exercise program Baseline: Started 02/22/2002 Goal status:  Met 03/07/2022   LONG TERM GOALS: Target date: 04/19/2022  Improve FOTO to 70 Baseline: 48 Goal status: On Going 03/14/2022  2.  Improve left shoulder pain to consistently 0-2 out of 10 on the visual analog scale Baseline: 2-6 out of 10 Goal status: On Going 03/14/2022  3.  Improve left shoulder strength by at least 10% Baseline: See objective Goal status: On Going 03/14/2022  4.  [name] will be able to perform reaching and overhead activities without increase in pain Baseline: Difficulty with reaching and overhead function at evaluation Goal status: On Going 03/14/2022  5.  [name] will be independent with his comprehensive program exercise program at discharge Baseline: Started 02/22/2022 Goal status: On Going 03/14/2022  PLAN:  PT FREQUENCY: 1-2x/week  PT DURATION: 8 weeks  PLANNED INTERVENTIONS: Therapeutic exercises, Therapeutic activity, Neuromuscular re-education, Patient/Family  education, Self Care, Joint mobilization, Cryotherapy, and Manual therapy  PLAN FOR NEXT SESSION: Progress scapular and rotator strengthening as appropriate.  Check strength and modify his home exercise program PRN.  FOTO and possible DC.   Farley Ly PT, MPT 03/14/2022, 11:05 AM

## 2022-03-15 ENCOUNTER — Other Ambulatory Visit: Payer: Self-pay | Admitting: Family Medicine

## 2022-03-21 ENCOUNTER — Encounter: Payer: 59 | Admitting: Rehabilitative and Restorative Service Providers"

## 2022-03-28 ENCOUNTER — Encounter: Payer: Self-pay | Admitting: Rehabilitative and Restorative Service Providers"

## 2022-03-28 ENCOUNTER — Ambulatory Visit (INDEPENDENT_AMBULATORY_CARE_PROVIDER_SITE_OTHER): Payer: Medicare Other | Admitting: Rehabilitative and Restorative Service Providers"

## 2022-03-28 DIAGNOSIS — M6281 Muscle weakness (generalized): Secondary | ICD-10-CM

## 2022-03-28 DIAGNOSIS — M25612 Stiffness of left shoulder, not elsewhere classified: Secondary | ICD-10-CM | POA: Diagnosis not present

## 2022-03-28 DIAGNOSIS — M25512 Pain in left shoulder: Secondary | ICD-10-CM

## 2022-03-28 NOTE — Therapy (Signed)
OUTPATIENT PHYSICAL THERAPY SHOULDER TREATMENT/DISCHARGE   Patient Name: [name] MRN: [id number] DOB:04/29/1950, 72 y.o., male Today's Date: 03/28/2022  PHYSICAL THERAPY DISCHARGE SUMMARY  Visits from Start of Care: 5  Current functional level related to goals / functional outcomes: See note   Remaining deficits: See note   Education / Equipment: Updated HEP   Patient agrees to discharge. Patient goals were met. Patient is being discharged due to being pleased with the current functional level.   END OF SESSION:  PT End of Session - 03/28/22 1015     Visit Number 5    Number of Visits 12    PT Start Time 0712    PT Stop Time 1056    PT Time Calculation (min) 42 min    Activity Tolerance Patient tolerated treatment well;No increased pain    Behavior During Therapy WFL for tasks assessed/performed               Past Medical History:  Diagnosis Date   Allergy    wood sap   Allergy history, seafood    oysters, mussels   Arthritis    neck   Bee sting allergy 1980   hives after 20 yellowjacket stings   Cancer ([place])    "skin cancer frozen off"   Colon polyps    q 3 years, last colonoscopy 03/12/18   GERD (gastroesophageal reflux disease)    Hypercholesteremia    Hypertension    Past Surgical History:  Procedure Laterality Date   CARPAL TUNNEL RELEASE Right 1985   elbow   SHOULDER ARTHROSCOPY WITH SUBACROMIAL DECOMPRESSION AND OPEN ROTATOR C Right 10/22/2012   Procedure: RIGHT SHOULDER ARTHROSCOPY WITH SUBACROMIAL DECOMPRESSION AND  ROTATOR CUFF REPAIR,  CORACOACROMIAL LIGAMENT RELEASE AND  MANIPULATION UNDER ANESTHESIA;  Surgeon: Dale Hai, MD;  Location: [facility];  Service: Orthopedics;  Laterality: Right;   Patient Active Problem List   Diagnosis Date Noted   Acute allergic reaction 07/26/2017   Colon polyps    HTN (hypertension) 06/18/2012   HLD (hyperlipidemia) 06/18/2012   GERD (gastroesophageal reflux disease)     PCP: Dale Frizzle, MD  REFERRING PROVIDER: Susy Frizzle, MD  REFERRING DIAG:  Diagnosis  M75.52 (ICD-10-CM) - Subacromial bursitis of left shoulder joint    THERAPY DIAG:  Muscle weakness (generalized)  Acute pain of left shoulder  Stiffness of left shoulder, not elsewhere classified  Rationale for Evaluation and Treatment: Rehabilitation  ONSET DATE: Within the last few months  SUBJECTIVE:  SUBJECTIVE STATEMENT:  [name] reports he continues to make progress with his left shoulder.  He is not self-limiting anything.  He can still get a "pinch" pain when lifting heavier objects out or overhead.  PERTINENT HISTORY: Cervical OA, HLD, HTN, previous Rt RTC repair and Rt carpal tunnel release  PAIN:  Are you having pain? Yes: NPRS scale: 0-3/10 Pain location: Left shoulder Pain description: discomfort/toothachey is better, can get a "twinge" pain with lifting or heavy use  Aggravating factors: Reaching and overhead function along with sleeping on the left side (significantly improved since day 1) Relieving factors: Change of position  PRECAUTIONS: None  WEIGHT BEARING RESTRICTIONS: No  FALLS:  Has patient fallen in last 6 months? No  LIVING ENVIRONMENT: Lives with: lives with their spouse Lives in: House/apartment Stairs:  Non problem Has following equipment at home: None  OCCUPATION: Retired, works around [employer], competitive Research officer, trade union  PLOF: Independent  PATIENT GOALS: Return to sleeping on the left side, reaching and overhead function  NEXT MD VISIT:   OBJECTIVE:   DIAGNOSTIC FINDINGS:  Right IMPRESSION:   1. Biceps long head tenosynovitis and tendinopathy.  2.  Supraspinatus and subscapularis tendinopathy with tiny  intrasubstance tears.  No tear extending to the surface of the   tendon or retraction.  3.  Subacromial bursitis.  4.  Mild AC joint osteoarthritis and lateral downsloping of the  acromion predisposing to impingement.  PATIENT SURVEYS:  03/28/2022 FOTO 76 (Goal met)  03/14/2022 FOTO 63  Eval FOTO 48 (Goal 70 in 14 visits)  COGNITION: Overall cognitive status: Within functional limits for tasks assessed     SENSATION: [name] had no complaints of peripheral pain or paresthesias  POSTURE: Mild internally rotated and protracted shoulders  UPPER EXTREMITY ROM:   Passive ROM 02/22/22 (L/R in degrees) 03/07/2022 Left in degrees 03/14/2022 Left in degrees 03/27/2022 Left in degrees  Shoulder flexion 175/175 175 180 180  Shoulder extension      Shoulder abduction      Shoulder horizontal adduction 25/25 30 35 40  Shoulder internal rotation 30/60 60 65 65  Shoulder external rotation 80/100 80 85 90  Elbow flexion      Elbow extension      Wrist flexion      Wrist extension      Wrist ulnar deviation      Wrist radial deviation      Wrist pronation      Wrist supination      (Blank rows = not tested)  UPPER EXTREMITY MMT:  MMT 02/22/22 L/R in pounds Left In pounds 03/14/2022 Left in pounds 03/27/2022  Shoulder flexion     Shoulder extension     Shoulder abduction     Shoulder adduction     Shoulder internal rotation 25.2/39.6 25.2 26.6  Shoulder external rotation 23.2/29.4 26.0 28.3  Middle trapezius     Lower trapezius     Elbow flexion     Elbow extension     Wrist flexion     Wrist extension     Wrist ulnar deviation     Wrist radial deviation     Wrist pronation     Wrist supination     Grip strength (lbs)     (Blank rows = not tested)    TODAY'S TREATMENT:  DATE:  03/28/2022 Supine scapular protraction with 5 pound weights 20 x 3 seconds Shoulder blade pinches 10 x 5 seconds Supine shoulder  IR/ER stretch 10 x 10 seconds each Posterior capsule stretch 5X 10 seconds Theraband ER/IR Green 2 sets of 10X each slow eccentrics Reassess, FOTO and make appropriate changes to his HEP   03/14/2022 Supine scapular protraction with 5 pound weights 20 x 3 seconds Shoulder blade pinches 10 x 5 seconds Supine shoulder IR stretch 10 x 10 seconds Posterior capsule stretch 10X 10 seconds Theraband ER/IR Green 2 sets of 10X each slow eccentrics Reassess, FOTO and make appropriate changes to his HEP   03/07/2022 Supine scapular protraction with 4 pound weights 20 x 3 seconds Shoulder blade pinches 10 x 5 seconds Supine shoulder IR stretch 10 x 10 seconds Posterior capsule stretch 10X 10 seconds Theraband ER/IR Red 2 sets of 10X each slow eccentrics Reassess AROM and make appropriate changes to his HEP   PATIENT EDUCATION: Education details: exercise form/purpose  Person educated: Patient Education method: Explanation, Demonstration, Tactile cues, Verbal cues, and Handouts Education comprehension: verbalized understanding, returned demonstration, verbal cues required, tactile cues required, and needs further education  HOME EXERCISE PROGRAM: Access Code: [email or web address] URL: [email or web address] Date: 03/28/2022 Prepared by: Vista Mink  Exercises - Supine Scapular Protraction in Flexion with Dumbbells  - 1 x daily - 3 x weekly - 1 sets - 20 reps - 3 seconds hold - Supine Shoulder Internal Rotation Stretch  - 1 x daily - 3-7 x weekly - 1 sets - 20 reps - 10 seconds hold - Standing Scapular Retraction  - 5 x daily - 7 x weekly - 1 sets - 5 reps - 5 second hold - Shoulder External Rotation with Anchored Resistance  - 1 x daily - 3 x weekly - 2 sets - 10 reps - 3 hold - Shoulder Internal Rotation with Resistance  - 1 x daily - 3 x weekly - 2 sets - 10 reps - 3 hold - Standing Shoulder Posterior Capsule Stretch  - 1 x daily - 3-7 x weekly - 1 sets - 5 reps - 10 seconds hold -  Supine Shoulder External Rotation Stretch  - 1 x daily - 3-7 x weekly - 1 sets - 10 reps - 10 seconds hold  ASSESSMENT:  CLINICAL IMPRESSION: [name] has met or is very close to meeting all long-term goals.  His HEP was updated today and he is comfortable with the idea of independent PT.  He will DC to independent PT   OBJECTIVE IMPAIRMENTS: decreased activity tolerance, decreased endurance, decreased knowledge of condition, decreased ROM, decreased strength, decreased safety awareness, impaired perceived functional ability, impaired flexibility, impaired UE functional use, and pain.   ACTIVITY LIMITATIONS: carrying, lifting, sleeping, and reach over head  PARTICIPATION LIMITATIONS: community activity  PERSONAL FACTORS: Cervical OA, HLD, HTN, previous Rt RTC repair and Rt carpal tunnel release are also affecting patient's functional outcome.   REHAB POTENTIAL: Good  CLINICAL DECISION MAKING: Stable/uncomplicated  EVALUATION COMPLEXITY: Low   GOALS: Goals reviewed with patient? Yes  SHORT TERM GOALS: Target date: 03/22/2022  Improve left shoulder IR AROM to 60 degrees and horizontal adduction to 40 degrees Baseline: 30 degrees and 25 degrees respectively Goal status: Met 03/27/2022  2.  [name] will be independent with his day 1 home exercise program Baseline: Started 02/22/2002 Goal status: Met 03/07/2022   LONG TERM GOALS: Target date: 04/19/2022  Improve FOTO to 70 Baseline: 48 Goal status: Met 03/27/2022  2.  Improve left shoulder pain to consistently 0-2 out of 10 on the visual analog scale Baseline: 2-6 out of 10 Goal status: On Going 03/27/2022  3.  Improve left shoulder strength by at least 10% Baseline: See objective Goal status: Met 03/27/2022  4.  [name] will be able to perform reaching and overhead activities without increase in pain Baseline: Difficulty with reaching and overhead function at evaluation Goal status: Partially Met 03/27/2022  5.  [name] will be  independent with his comprehensive program exercise program at discharge Baseline: Started 02/22/2022 Goal status: Met 03/27/2022  PLAN:  PT FREQUENCY: DC  PT DURATION: DC  PLANNED INTERVENTIONS: Therapeutic exercises, Therapeutic activity, Neuromuscular re-education, Patient/Family education, Self Care, Joint mobilization, Cryotherapy, and Manual therapy  PLAN FOR NEXT SESSION: PDC   Farley Ly PT, MPT 03/28/2022, 11:02 AM

## 2022-04-01 ENCOUNTER — Other Ambulatory Visit: Payer: Self-pay | Admitting: Family Medicine

## 2022-04-01 NOTE — Telephone Encounter (Signed)
Future visit in 2 months.  Requested Prescriptions  Pending Prescriptions Disp Refills   pantoprazole (PROTONIX) 40 MG tablet [Pharmacy Med Name: Pantoprazole Sodium 40 MG Oral Tablet Delayed Release] 120 tablet 0    Sig: TAKE 1 TABLET BY MOUTH DAILY     Gastroenterology: Proton Pump Inhibitors Failed - 04/01/2022  5:25 PM      Failed - Valid encounter within last 12 months    Recent Outpatient Visits           9 months ago Benign essential HTN   [place] Susy Frizzle, MD   1 year ago [place], Jessica A, NP   1 year ago [place] Eulogio Bear, NP   1 year ago Benign essential HTN   [place], Cammie Mcgee, MD   2 years ago Dermatitis due to plants, including poison ivy, sumac, and oak   [place], [place] Nunnery, MD       Future Appointments             In 2 months [place], Cammie Mcgee, MD [place]

## 2022-05-12 ENCOUNTER — Other Ambulatory Visit: Payer: Self-pay | Admitting: Family Medicine

## 2022-05-12 DIAGNOSIS — I10 Essential (primary) hypertension: Secondary | ICD-10-CM

## 2022-05-21 ENCOUNTER — Other Ambulatory Visit: Payer: Self-pay | Admitting: Family Medicine

## 2022-05-21 ENCOUNTER — Encounter: Payer: Self-pay | Admitting: Family Medicine

## 2022-05-28 ENCOUNTER — Other Ambulatory Visit: Payer: Self-pay | Admitting: Family Medicine

## 2022-05-28 MED ORDER — DOXAZOSIN MESYLATE 4 MG PO TABS: 4.0000 mg | ORAL_TABLET | Freq: Every day | ORAL | 3 refills | Status: DC

## 2022-06-10 ENCOUNTER — Other Ambulatory Visit: Payer: 59

## 2022-06-12 ENCOUNTER — Other Ambulatory Visit: Payer: Medicare Other

## 2022-06-12 ENCOUNTER — Other Ambulatory Visit: Payer: 59

## 2022-06-12 DIAGNOSIS — R5383 Other fatigue: Secondary | ICD-10-CM

## 2022-06-12 DIAGNOSIS — I10 Essential (primary) hypertension: Secondary | ICD-10-CM

## 2022-06-12 DIAGNOSIS — E7800 Pure hypercholesterolemia, unspecified: Secondary | ICD-10-CM

## 2022-06-12 DIAGNOSIS — Z125 Encounter for screening for malignant neoplasm of prostate: Secondary | ICD-10-CM

## 2022-06-13 LAB — CBC WITH DIFFERENTIAL/PLATELET
Absolute Monocytes: 752 cells/uL (ref 200–950)
Basophils Absolute: 80 cells/uL (ref 0–200)
Basophils Relative: 1.1 %
Eosinophils Absolute: 219 cells/uL (ref 15–500)
Eosinophils Relative: 3 %
HCT: 42.8 % (ref 38.5–50.0)
Hemoglobin: 14.4 g/dL (ref 13.2–17.1)
Lymphs Abs: 1511 cells/uL (ref 850–3900)
MCH: 32.8 pg (ref 27.0–33.0)
MCHC: 33.6 g/dL (ref 32.0–36.0)
MCV: 97.5 fL (ref 80.0–100.0)
MPV: 10.5 fL (ref 7.5–12.5)
Monocytes Relative: 10.3 %
Neutro Abs: 4738 cells/uL (ref 1500–7800)
Neutrophils Relative %: 64.9 %
Platelets: 245 10*3/uL (ref 140–400)
RBC: 4.39 10*6/uL (ref 4.20–5.80)
RDW: 11.8 % (ref 11.0–15.0)
Total Lymphocyte: 20.7 %
WBC: 7.3 10*3/uL (ref 3.8–10.8)

## 2022-06-13 LAB — PSA: PSA: 0.61 ng/mL (ref <=4.00)

## 2022-06-13 LAB — COMPLETE METABOLIC PANEL WITH GFR
AG Ratio: 2 (calc) (ref 1.0–2.5)
ALT: 17 U/L (ref 9–46)
AST: 19 U/L (ref 10–35)
Albumin: 3.9 g/dL (ref 3.6–5.1)
Alkaline phosphatase (APISO): 61 U/L (ref 35–144)
BUN: 14 mg/dL (ref 7–25)
CO2: 29 mmol/L (ref 20–32)
Calcium: 8.6 mg/dL (ref 8.6–10.3)
Chloride: 109 mmol/L (ref 98–110)
Creat: 0.81 mg/dL (ref 0.70–1.28)
Globulin: 2 g/dL (calc) (ref 1.9–3.7)
Glucose, Bld: 87 mg/dL (ref 65–99)
Potassium: 4.2 mmol/L (ref 3.5–5.3)
Sodium: 145 mmol/L (ref 135–146)
Total Bilirubin: 0.4 mg/dL (ref 0.2–1.2)
Total Protein: 5.9 g/dL — ABNORMAL LOW (ref 6.1–8.1)
eGFR: 95 mL/min/{1.73_m2} (ref >=60)

## 2022-06-13 LAB — TESTOSTERONE: Testosterone: 531 ng/dL (ref 250–827)

## 2022-06-13 LAB — LIPID PANEL
Cholesterol: 133 mg/dL (ref <200)
HDL: 55 mg/dL (ref >=40)
LDL Cholesterol (Calc): 64 mg/dL (calc)
Non-HDL Cholesterol (Calc): 78 mg/dL (calc) (ref <130)
Total CHOL/HDL Ratio: 2.4 (calc) (ref <5.0)
Triglycerides: 63 mg/dL (ref <150)

## 2022-06-13 LAB — VITAMIN B12: Vitamin B-12: 506 pg/mL (ref 200–1100)

## 2022-06-17 ENCOUNTER — Telehealth: Payer: Self-pay | Admitting: Family Medicine

## 2022-06-17 NOTE — Telephone Encounter (Signed)
Patient returned missed call; unsure of who called him.  

## 2022-06-20 ENCOUNTER — Ambulatory Visit (INDEPENDENT_AMBULATORY_CARE_PROVIDER_SITE_OTHER): Payer: Medicare Other | Admitting: Family Medicine

## 2022-06-20 ENCOUNTER — Encounter: Payer: Self-pay | Admitting: Family Medicine

## 2022-06-20 VITALS — BP 136/72 | HR 74 | Temp 98.4°F | Ht 70.0 in | Wt 196.0 lb

## 2022-06-20 DIAGNOSIS — Z125 Encounter for screening for malignant neoplasm of prostate: Secondary | ICD-10-CM | POA: Diagnosis not present

## 2022-06-20 DIAGNOSIS — E7800 Pure hypercholesterolemia, unspecified: Secondary | ICD-10-CM | POA: Diagnosis not present

## 2022-06-20 DIAGNOSIS — Z0001 Encounter for general adult medical examination with abnormal findings: Secondary | ICD-10-CM | POA: Diagnosis not present

## 2022-06-20 DIAGNOSIS — Z0000 Encounter for general adult medical examination without abnormal findings: Secondary | ICD-10-CM

## 2022-06-20 DIAGNOSIS — I10 Essential (primary) hypertension: Secondary | ICD-10-CM | POA: Diagnosis not present

## 2022-06-20 NOTE — Progress Notes (Signed)
Subjective:    Patient ID: [name], male    DOB: 1950/08/27, 72 y.o.   MRN: [id number]  HPI Patient is a very pleasant 72 year old Caucasian gentleman here today for complete physical exam.  His last colonoscopy was in 2020.  He had 3 sessile polyps.  The biopsy was normal however they recommended a repeat colonoscopy in 3 years so this is due.  I recommended that he call with gastroenterologist to schedule this.  His most recent PSA was 0.6 and well within normal limits.  His immunizations are up-to-date except for the RSV vaccine and the COVID-vaccine.  We discussed both of these.  He is caring for young grandchild so I did suggest getting the RSV vaccine to protect the  grandchild.  His most recent lab work is listed below Immunization History  Administered Date(s) Administered   Fluad Quad(high Dose 65+) 02/11/2020, 12/12/2020   Influenza Split 12/16/2012   Influenza, High Dose Seasonal PF 11/25/2018   Influenza,inj,Quad PF,6+ Mos 12/27/2015, 12/11/2016   Influenza,trivalent, recombinat, inj, PF 10/21/2017   Influenza-Unspecified 01/02/2014   PFIZER(Purple Top)SARS-COV-2 Vaccination 05/09/2019, 05/31/2019   Pneumococcal Conjugate-13 06/11/2017   Pneumococcal Polysaccharide-23 05/26/2018   Tdap 01/25/2011   Zoster Recombinat (Shingrix) 07/16/2017, 10/21/2017   Zoster, Live 01/03/2014     Past Medical History:  Diagnosis Date   Allergy    wood sap   Allergy history, seafood    oysters, mussels   Arthritis    neck   Bee sting allergy 1980   hives after 20 yellowjacket stings   Cancer    "skin cancer frozen off"   Colon polyps    q 3 years, last colonoscopy 03/12/18   GERD (gastroesophageal reflux disease)    Hypercholesteremia    Hypertension    Past Surgical History:  Procedure Laterality Date   CARPAL TUNNEL RELEASE Right 1985   elbow   SHOULDER ARTHROSCOPY WITH SUBACROMIAL DECOMPRESSION AND OPEN ROTATOR C Right 10/22/2012   Procedure: RIGHT SHOULDER  ARTHROSCOPY WITH SUBACROMIAL DECOMPRESSION AND  ROTATOR CUFF REPAIR,  CORACOACROMIAL LIGAMENT RELEASE AND  MANIPULATION UNDER ANESTHESIA;  Surgeon: Javier Docker, MD;  Location: [facility];  Service: Orthopedics;  Laterality: Right;   Current Outpatient Medications on File Prior to Visit  Medication Sig Dispense Refill   amLODipine (NORVASC) 10 MG tablet TAKE 1 TABLET BY MOUTH DAILY 120 tablet 2   doxazosin (CARDURA) 4 MG tablet Take 1 tablet (4 mg total) by mouth daily. 90 tablet 3   HYDROcodone bit-homatropine (HYCODAN) 5-1.5 MG/5ML syrup Take 5 mLs by mouth every 8 (eight) hours as needed for cough. 120 mL 0   KLOR-CON M20 20 MEQ tablet TAKE 1 TABLET BY MOUTH EVERY DAY 90 tablet 3   levocetirizine (XYZAL) 5 MG tablet TAKE 1 TABLET BY MOUTH IN THE  EVENING 120 tablet 0   losartan (COZAAR) 100 MG tablet TAKE 1 TABLET BY MOUTH IN  THE MORNING 120 tablet 2   pantoprazole (PROTONIX) 40 MG tablet TAKE 1 TABLET BY MOUTH DAILY 120 tablet 0   rosuvastatin (CRESTOR) 10 MG tablet TAKE 1 TABLET BY MOUTH  DAILY 90 tablet 2   zafirlukast (ACCOLATE) 20 MG tablet TAKE 1 TABLET BY MOUTH TWICE A DAY BEFORE MEALS 180 tablet 3   No current facility-administered medications on file prior to visit.   Allergies  Allergen Reactions   Iodine     Topical antiseptic-as a young adult. Don't know reaction   Other     Wood Sap causes  Hives   Social History   Socioeconomic History   Marital status: Married    Spouse name: Not on file   Number of children: Not on file   Years of education: Not on file   Highest education level: Not on file  Occupational History   Not on file  Tobacco Use   Smoking status: Former    Packs/day: 1.00    Years: 15.00    Additional pack years: 0.00    Total pack years: 15.00    Types: Cigarettes    Quit date: 03/04/1984    Years since quitting: 38.3   Smokeless tobacco: Former    Types: Chew    Quit date: 03/04/2008  Substance and Sexual Activity   Alcohol use: No   Drug use:  No   Sexual activity: Not on file  Other Topics Concern   Not on file  Social History Narrative   Not on file   Social Determinants of Health   Financial Resource Strain: Not on file  Food Insecurity: Not on file  Transportation Needs: Not on file  Physical Activity: Not on file  Stress: Not on file  Social Connections: Not on file  Intimate Partner Violence: Not on file   Family History  Problem Relation Age of Onset   Heart failure Mother    Heart failure Father      Review of Systems  All other systems reviewed and are negative.      Objective:   Physical Exam Vitals reviewed.  Constitutional:      General: He is not in acute distress.    Appearance: He is well-developed. He is not diaphoretic.  HENT:     Head: Normocephalic and atraumatic.     Right Ear: External ear normal.     Left Ear: External ear normal.     Nose: Nose normal.     Mouth/Throat:     Pharynx: No oropharyngeal exudate.  Eyes:     General: No scleral icterus.       Right eye: No discharge.        Left eye: No discharge.     Conjunctiva/sclera: Conjunctivae normal.     Pupils: Pupils are equal, round, and reactive to light.  Neck:     Thyroid: No thyromegaly.     Vascular: No JVD.     Trachea: No tracheal deviation.  Cardiovascular:     Rate and Rhythm: Normal rate and regular rhythm.     Heart sounds: Normal heart sounds. No murmur heard.    No friction rub. No gallop.  Pulmonary:     Effort: Pulmonary effort is normal. No respiratory distress.     Breath sounds: Normal breath sounds. No stridor. No wheezing or rales.  Chest:     Chest wall: No tenderness.  Abdominal:     General: Bowel sounds are normal. There is no distension.     Palpations: Abdomen is soft. There is no mass.     Tenderness: There is no abdominal tenderness. There is no guarding or rebound.  Musculoskeletal:        General: No tenderness. Normal range of motion.     Cervical back: Normal range of motion and  neck supple.  Lymphadenopathy:     Cervical: No cervical adenopathy.  Skin:    General: Skin is warm.     Coloration: Skin is not pale.     Findings: No erythema or rash.  Neurological:     Mental Status: He  is alert and oriented to person, place, and time.     Cranial Nerves: No cranial nerve deficit.     Motor: No abnormal muscle tone.     Coordination: Coordination normal.     Deep Tendon Reflexes: Reflexes are normal and symmetric.  Psychiatric:        Behavior: Behavior normal.        Thought Content: Thought content normal.        Judgment: Judgment normal.      Lab on 06/12/2022  Component Date Value Ref Range Status   WBC 06/12/2022 7.3  3.8 - 10.8 Thousand/uL Final   RBC 06/12/2022 4.39  4.20 - 5.80 Million/uL Final   Hemoglobin 06/12/2022 14.4  13.2 - 17.1 g/dL Final   HCT 66/44/0347 42.8  38.5 - 50.0 % Final   MCV 06/12/2022 97.5  80.0 - 100.0 fL Final   MCH 06/12/2022 32.8  27.0 - 33.0 pg Final   MCHC 06/12/2022 33.6  32.0 - 36.0 g/dL Final   RDW 42/59/5638 11.8  11.0 - 15.0 % Final   Platelets 06/12/2022 245  140 - 400 Thousand/uL Final   MPV 06/12/2022 10.5  7.5 - 12.5 fL Final   Neutro Abs 06/12/2022 4,738  1,500 - 7,800 cells/uL Final   Lymphs Abs 06/12/2022 1,511  850 - 3,900 cells/uL Final   Absolute Monocytes 06/12/2022 752  200 - 950 cells/uL Final   Eosinophils Absolute 06/12/2022 219  15 - 500 cells/uL Final   Basophils Absolute 06/12/2022 80  0 - 200 cells/uL Final   Neutrophils Relative % 06/12/2022 64.9  % Final   Total Lymphocyte 06/12/2022 20.7  % Final   Monocytes Relative 06/12/2022 10.3  % Final   Eosinophils Relative 06/12/2022 3.0  % Final   Basophils Relative 06/12/2022 1.1  % Final   Glucose, Bld 06/12/2022 87  65 - 99 mg/dL Final   Comment: .            Fasting reference interval .    BUN 06/12/2022 14  7 - 25 mg/dL Final   Creat 75/64/3329 0.81  0.70 - 1.28 mg/dL Final   eGFR 51/88/4166 95  > OR = 60 mL/min/1.13m2 Final    BUN/Creatinine Ratio 06/12/2022 SEE NOTE:  6 - 22 (calc) Final   Comment:    Not Reported: BUN and Creatinine are within    reference range. .    Sodium 06/12/2022 145  135 - 146 mmol/L Final   Potassium 06/12/2022 4.2  3.5 - 5.3 mmol/L Final   Chloride 06/12/2022 109  98 - 110 mmol/L Final   CO2 06/12/2022 29  20 - 32 mmol/L Final   Calcium 06/12/2022 8.6  8.6 - 10.3 mg/dL Final   Total Protein 09/01/1599 5.9 (L)  6.1 - 8.1 g/dL Final   Albumin 09/32/3557 3.9  3.6 - 5.1 g/dL Final   Globulin 32/20/2542 2.0  1.9 - 3.7 g/dL (calc) Final   AG Ratio 06/12/2022 2.0  1.0 - 2.5 (calc) Final   Total Bilirubin 06/12/2022 0.4  0.2 - 1.2 mg/dL Final   Alkaline phosphatase (APISO) 06/12/2022 61  35 - 144 U/L Final   AST 06/12/2022 19  10 - 35 U/L Final   ALT 06/12/2022 17  9 - 46 U/L Final   Cholesterol 06/12/2022 133  <200 mg/dL Final   HDL 70/62/3762 55  > OR = 40 mg/dL Final   Triglycerides 83/15/1761 63  <150 mg/dL Final   LDL Cholesterol (Calc) 06/12/2022 64  mg/dL (calc) Final   Comment: Reference range: <100 . Desirable range <100 mg/dL for primary prevention;   <70 mg/dL for patients with CHD or diabetic patients  with > or = 2 CHD risk factors. Marland Kitchen LDL-C is now calculated using the Martin-Hopkins  calculation, which is a validated novel method providing  better accuracy than the Friedewald equation in the  estimation of LDL-C.  Horald Pollen et al. Lenox Ahr. 1610;960(45): 2061-2068  (http://education.[facility].com/faq/FAQ164)    Total CHOL/HDL Ratio 06/12/2022 2.4  <4.0 (calc) Final   Non-HDL Cholesterol (Calc) 06/12/2022 78  <130 mg/dL (calc) Final   Comment: For patients with diabetes plus 1 major ASCVD risk  factor, treating to a non-HDL-C goal of <100 mg/dL  (LDL-C of <98 mg/dL) is considered a therapeutic  option.    Testosterone 06/12/2022 531  250 - 827 ng/dL Final   PSA 11/91/4782 0.61  < OR = 4.00 ng/mL Final   Comment: The total PSA value from this assay system is   standardized against the WHO standard. The test  result will be approximately 20% lower when compared  to the equimolar-standardized total PSA (Beckman  Coulter). Comparison of serial PSA results should be  interpreted with this fact in mind. . This test was performed using the Siemens  chemiluminescent method. Values obtained from  different assay methods cannot be used interchangeably. PSA levels, regardless of value, should not be interpreted as absolute evidence of the presence or absence of disease.    Vitamin B-12 06/12/2022 506  200 - 1,100 pg/mL Final        Assessment & Plan:  Annual physical exam  Benign essential HTN  Prostate cancer screening  Pure hypercholesterolemia I did treat a lesion on his right cheek with liquid nitrogen cryotherapy for a total of 30 seconds.  I believe this was an irritated SK however the patient request that I treated due to its persistence and scaly appearance.  Blood pressure today is excellent.  Home blood pressures are well-controlled.  PSA is outstanding.  Cholesterol is excellent.  I recommended RSV vaccine and we also discussed the COVID vaccination.  Also recommended that he contact his gastroenterologist to schedule colonoscopy.  He denies any falls, memory loss, or depression

## 2022-06-21 ENCOUNTER — Encounter: Payer: Self-pay | Admitting: Family Medicine

## 2022-07-10 ENCOUNTER — Other Ambulatory Visit: Payer: Self-pay | Admitting: Family Medicine

## 2022-07-22 ENCOUNTER — Ambulatory Visit: Payer: Medicare Other | Admitting: Family Medicine

## 2022-07-22 ENCOUNTER — Encounter: Payer: Self-pay | Admitting: Family Medicine

## 2022-07-22 VITALS — BP 136/82 | HR 64 | Temp 98.5°F | Ht 70.0 in | Wt 198.8 lb

## 2022-07-22 DIAGNOSIS — M7582 Other shoulder lesions, left shoulder: Secondary | ICD-10-CM

## 2022-07-22 NOTE — Progress Notes (Signed)
Subjective:    Patient ID: [name], male    DOB: 1950/04/07, 72 y.o.   MRN: [id number]  02/07/22 Patient reports the gradual onset of pain in his left shoulder over the last several months.  He denies any specific fall or injury.  He states that it is aching at night keeping him awake.  The pain is located in the subacromial space.  He has pain with abduction greater than 90 degrees.  He has some mild pain with internal and external rotation.  However resisted abduction elicits pain.  Empty can testing elicits pain.  Drop test elicits pain.  He has a negative O'Brien sign.  He has a negative Spurling sign.  There is no significant crepitus with range of motion.  At that time, my plan was:  I believe the patient has subacromial bursitis in his left shoulder although I cannot exclude a partial tear of the supraspinatus.  We discussed treatment options and he requests a cortisone injection.  Using sterile technique, I injected the left subacromial space with 2 cc of lidocaine, 2 cc of Marcaine, and 2 cc of 40 mg/mL Kenalog.  He tolerated the procedure well without complication.  I will arrange physical therapy.  If worsening we will proceed with imaging of the left shoulder.  While the patient was here today also treated a pink scaly hard papule on his left forehead with liquid nitrogen cryotherapy for 30 seconds.  If the lesion persist he will require biopsy  07/22/22 Patient ultimately went to physical therapy and graduated from physical therapy doing quite well.  He was essentially pain-free and shoulder however once he started exercising such as paddling his kayak, he started developing pain again and subsequent infection.  The patient essentially fell and landed on his outstretched arm.  He sharp severe pain in his left shoulder.  Today he has pain with abduction greater than 90 degrees.  He has weakness with abduction greater than 90 degrees.  He has pain with internal and external  rotation Past Medical History:  Diagnosis Date   Allergy    wood sap   Allergy history, seafood    oysters, mussels   Arthritis    neck   Bee sting allergy 1980   hives after 20 yellowjacket stings   Cancer (HCC)    "skin cancer frozen off"   Colon polyps    q 3 years, last colonoscopy 03/12/18   GERD (gastroesophageal reflux disease)    Hypercholesteremia    Hypertension    Past Surgical History:  Procedure Laterality Date   CARPAL TUNNEL RELEASE Right 1985   elbow   SHOULDER ARTHROSCOPY WITH SUBACROMIAL DECOMPRESSION AND OPEN ROTATOR C Right 10/22/2012   Procedure: RIGHT SHOULDER ARTHROSCOPY WITH SUBACROMIAL DECOMPRESSION AND  ROTATOR CUFF REPAIR,  CORACOACROMIAL LIGAMENT RELEASE AND  MANIPULATION UNDER ANESTHESIA;  Surgeon: Javier Docker, MD;  Location: [facility];  Service: Orthopedics;  Laterality: Right;   Current Outpatient Medications on File Prior to Visit  Medication Sig Dispense Refill   amLODipine (NORVASC) 10 MG tablet TAKE 1 TABLET BY MOUTH DAILY 120 tablet 2   doxazosin (CARDURA) 4 MG tablet Take 1 tablet (4 mg total) by mouth daily. 90 tablet 3   HYDROcodone bit-homatropine (HYCODAN) 5-1.5 MG/5ML syrup Take 5 mLs by mouth every 8 (eight) hours as needed for cough. 120 mL 0   KLOR-CON M20 20 MEQ tablet TAKE 1 TABLET BY MOUTH EVERY DAY 90 tablet 3   levocetirizine (XYZAL) 5 MG  tablet TAKE 1 TABLET BY MOUTH IN THE  EVENING 120 tablet 0   losartan (COZAAR) 100 MG tablet TAKE 1 TABLET BY MOUTH IN  THE MORNING 120 tablet 2   pantoprazole (PROTONIX) 40 MG tablet TAKE 1 TABLET BY MOUTH DAILY 120 tablet 2   rosuvastatin (CRESTOR) 10 MG tablet TAKE 1 TABLET BY MOUTH  DAILY 90 tablet 2   zafirlukast (ACCOLATE) 20 MG tablet TAKE 1 TABLET BY MOUTH TWICE A DAY BEFORE MEALS 180 tablet 3   No current facility-administered medications on file prior to visit.   Allergies  Allergen Reactions   Iodine     Topical antiseptic-as a young adult. Don't know reaction   Other     Wood  Sap causes Hives   Social History   Socioeconomic History   Marital status: Married    Spouse name: Not on file   Number of children: Not on file   Years of education: Not on file   Highest education level: Bachelor's degree (e.g., BA, AB, BS)  Occupational History   Not on file  Tobacco Use   Smoking status: Former    Packs/day: 1.00    Years: 15.00    Additional pack years: 0.00    Total pack years: 15.00    Types: Cigarettes    Quit date: 03/04/1984    Years since quitting: 38.4   Smokeless tobacco: Former    Types: Chew    Quit date: 03/04/2008  Substance and Sexual Activity   Alcohol use: No   Drug use: No   Sexual activity: Not on file  Other Topics Concern   Not on file  Social History Narrative   Not on file   Social Determinants of Health   Financial Resource Strain: Low Risk  (07/22/2022)   Overall Financial Resource Strain (CARDIA)    Difficulty of Paying Living Expenses: Not hard at all  Food Insecurity: No Food Insecurity (07/22/2022)   Hunger Vital Sign    Worried About Running Out of Food in the Last Year: Never true    Ran Out of Food in the Last Year: Never true  Transportation Needs: No Transportation Needs (07/22/2022)   PRAPARE - Administrator, Civil Service (Medical): No    Lack of Transportation (Non-Medical): No  Physical Activity: Insufficiently Active (07/22/2022)   Exercise Vital Sign    Days of Exercise per Week: 5 days    Minutes of Exercise per Session: 10 min  Stress: No Stress Concern Present (07/22/2022)   [employer] of Occupational Health - Occupational Stress Questionnaire    Feeling of Stress : Not at all  Social Connections: Socially Integrated (07/22/2022)   Social Connection and Isolation Panel [NHANES]    Frequency of Communication with Friends and Family: Twice a week    Frequency of Social Gatherings with Friends and Family: Twice a week    Attends Religious Services: More than 4 times per year    Active  Member of [employer] or Organizations: Yes    Attends Engineer, structural: More than 4 times per year    Marital Status: Married  Catering manager Violence: Not on file     Review of Systems  Musculoskeletal:  Positive for back pain.  Skin:  Positive for rash.  All other systems reviewed and are negative.      Objective:   Physical Exam Constitutional:      Appearance: Normal appearance. He is normal weight.  Cardiovascular:  Rate and Rhythm: Normal rate and regular rhythm.     Heart sounds: Normal heart sounds. No murmur heard.    No friction rub.  Pulmonary:     Effort: Pulmonary effort is normal. No respiratory distress.     Breath sounds: Normal breath sounds. No wheezing, rhonchi or rales.  Musculoskeletal:     Left shoulder: Tenderness present. No deformity, effusion or bony tenderness. Decreased range of motion. Decreased strength.  Neurological:     Mental Status: He is alert.   Patient has weakness and pain with abduction.  Patient has pain with drop testing.  He has pain with Hawkins maneuver.  He has pain with empty can sign.       Assessment & Plan:  Tendinitis of left rotator cuff Patient certainly has tendinitis in his left supraspinatus muscle.  There may be a partial tear.  We discussed that MRI given the recurrent nature versus a cortisone shot and physical therapy.  He will try cortisone shot and physical therapy again.  Using sterile technique, I injected the left subacromial space with 2 cc of lidocaine, 2 cc of Marcaine, and 2 cc of 40 mg/mL Kenalog.  He tolerated the procedure well without complication.  If pain does not improve, the neck step would be to begin with imaging

## 2022-08-26 ENCOUNTER — Other Ambulatory Visit: Payer: Self-pay | Admitting: Family Medicine

## 2022-09-04 ENCOUNTER — Other Ambulatory Visit: Payer: Self-pay | Admitting: Family Medicine

## 2022-09-04 NOTE — Telephone Encounter (Signed)
OV 06/20/22 Requested Prescriptions  Pending Prescriptions Disp Refills   levocetirizine (XYZAL) 5 MG tablet [Pharmacy Med Name: Levocetirizine Dihydrochloride 5 MG Oral Tablet] 120 tablet 0    Sig: TAKE 1 TABLET BY MOUTH EVERY  EVENING     Ear, Nose, and Throat:  Antihistamines - levocetirizine dihydrochloride Failed - 09/04/2022  7:19 AM      Failed - Valid encounter within last 12 months    Recent Outpatient Visits           1 year ago Benign essential HTN   [facility] [place], Priscille Heidelberg, MD   1 year ago Rash   [facility] Valentino Nose, NP   1 year ago Rash   [facility] Valentino Nose, NP   2 years ago Benign essential HTN   [facility] Tanya Nones, Priscille Heidelberg, MD   2 years ago Dermatitis due to plants, including poison ivy, sumac, and oak   [facility] [place], Velna Hatchet, MD              Passed - Cr in normal range and within 360 days    Creat  Date Value Ref Range Status  06/12/2022 0.81 0.70 - 1.28 mg/dL Final         Passed - eGFR is 10 or above and within 360 days    GFR, Est African American  Date Value Ref Range Status  06/15/2020 105 > OR = 60 mL/min/1.67m2 Final   GFR, Est Non African American  Date Value Ref Range Status  06/15/2020 90 > OR = 60 mL/min/1.58m2 Final   eGFR  Date Value Ref Range Status  06/12/2022 95 > OR = 60 mL/min/1.64m2 Final

## 2022-09-07 ENCOUNTER — Other Ambulatory Visit: Payer: Self-pay | Admitting: Family Medicine

## 2022-09-07 DIAGNOSIS — I10 Essential (primary) hypertension: Secondary | ICD-10-CM

## 2022-09-09 NOTE — Telephone Encounter (Signed)
Requested Prescriptions  Pending Prescriptions Disp Refills   amLODipine (NORVASC) 10 MG tablet [Pharmacy Med Name: AMLODIPINE BESYLATE 10 MG TAB] 90 tablet 3    Sig: TAKE 1 TABLET BY MOUTH EVERY DAY     Cardiovascular: Calcium Channel Blockers 2 Failed - 09/07/2022  9:31 AM      Failed - Valid encounter within last 6 months    Recent Outpatient Visits           1 year ago Benign essential HTN   [facility] [place], Priscille Heidelberg, MD   1 year ago Rash   [facility] Valentino Nose, NP   1 year ago Rash   [facility] Valentino Nose, NP   2 years ago Benign essential HTN   [facility] [place], Priscille Heidelberg, MD   2 years ago Dermatitis due to plants, including poison ivy, sumac, and oak   [facility] [place], Velna Hatchet, MD              Passed - Last BP in normal range    BP Readings from Last 1 Encounters:  07/22/22 136/82         Passed - Last Heart Rate in normal range    Pulse Readings from Last 1 Encounters:  07/22/22 64

## 2022-10-10 ENCOUNTER — Other Ambulatory Visit: Payer: Self-pay

## 2022-10-10 ENCOUNTER — Encounter: Payer: Self-pay | Admitting: Family Medicine

## 2022-10-10 DIAGNOSIS — K222 Esophageal obstruction: Secondary | ICD-10-CM

## 2022-10-10 DIAGNOSIS — T7840XA Allergy, unspecified, initial encounter: Secondary | ICD-10-CM

## 2022-10-10 MED ORDER — ZAFIRLUKAST 20 MG PO TABS
ORAL_TABLET | ORAL | 1 refills | Status: DC
Start: 2022-10-10 — End: 2023-02-21

## 2022-10-10 MED ORDER — ZAFIRLUKAST 20 MG PO TABS
ORAL_TABLET | ORAL | 1 refills | Status: DC
Start: 2022-10-10 — End: 2022-10-10

## 2022-12-02 ENCOUNTER — Other Ambulatory Visit: Payer: Self-pay | Admitting: Family Medicine

## 2022-12-03 NOTE — Telephone Encounter (Signed)
Requested Prescriptions  Pending Prescriptions Disp Refills   rosuvastatin (CRESTOR) 10 MG tablet [Pharmacy Med Name: Rosuvastatin Calcium 10 MG Oral Tablet] 90 tablet 2    Sig: TAKE 1 TABLET BY MOUTH DAILY     Cardiovascular:  Antilipid - Statins 2 Failed - 12/02/2022  4:39 AM      Failed - Valid encounter within last 12 months    Recent Outpatient Visits           1 year ago Benign essential HTN   [facility] [place], Priscille Heidelberg, MD   1 year ago Rash   [facility] Valentino Nose, NP   1 year ago Rash   [facility] Valentino Nose, NP   2 years ago Benign essential HTN   [facility] [place], Priscille Heidelberg, MD   2 years ago Dermatitis due to plants, including poison ivy, sumac, and oak   [facility] [place], Velna Hatchet, MD              Failed - Lipid Panel in normal range within the last 12 months    Cholesterol  Date Value Ref Range Status  06/12/2022 133 <200 mg/dL Final   LDL Cholesterol (Calc)  Date Value Ref Range Status  06/12/2022 64 mg/dL (calc) Final    Comment:    Reference range: <100 . Desirable range <100 mg/dL for primary prevention;   <70 mg/dL for patients with CHD or diabetic patients  with > or = 2 CHD risk factors. Marland Kitchen LDL-C is now calculated using the Martin-Hopkins  calculation, which is a validated novel method providing  better accuracy than the Friedewald equation in the  estimation of LDL-C.  Horald Pollen et al. Lenox Ahr. 8295;621(30): 2061-2068  (http://education.[facility].com/faq/FAQ164)    HDL  Date Value Ref Range Status  06/12/2022 55 > OR = 40 mg/dL Final   Triglycerides  Date Value Ref Range Status  06/12/2022 63 <150 mg/dL Final         Passed - Cr in normal range and within 360 days    Creat  Date Value Ref Range Status  06/12/2022 0.81 0.70 - 1.28 mg/dL Final         Passed - Patient is not pregnant

## 2022-12-06 ENCOUNTER — Other Ambulatory Visit: Payer: Self-pay | Admitting: Family Medicine

## 2022-12-06 NOTE — Telephone Encounter (Signed)
Requested Prescriptions  Pending Prescriptions Disp Refills   KLOR-CON M20 20 MEQ tablet [Pharmacy Med Name: KLOR-CON M20 TABLET] 90 tablet 3    Sig: TAKE 1 TABLET BY MOUTH EVERY DAY     Endocrinology:  Minerals - Potassium Supplementation Failed - 12/06/2022  1:33 AM      Failed - Valid encounter within last 12 months    Recent Outpatient Visits           1 year ago Benign essential HTN   [facility] [place], Priscille Heidelberg, MD   1 year ago Rash   [facility] Valentino Nose, NP   1 year ago Rash   [facility] Valentino Nose, NP   2 years ago Benign essential HTN   [facility] Donita Brooks, MD   2 years ago Dermatitis due to plants, including poison ivy, sumac, and oak   [facility] [place], Velna Hatchet, MD              Passed - K in normal range and within 360 days    Potassium  Date Value Ref Range Status  06/12/2022 4.2 3.5 - 5.3 mmol/L Final         Passed - Cr in normal range and within 360 days    Creat  Date Value Ref Range Status  06/12/2022 0.81 0.70 - 1.28 mg/dL Final

## 2022-12-07 ENCOUNTER — Other Ambulatory Visit: Payer: Self-pay | Admitting: Family Medicine

## 2022-12-09 NOTE — Telephone Encounter (Signed)
Rx 09/04/22 #120 -too soon Requested Prescriptions  Pending Prescriptions Disp Refills   levocetirizine (XYZAL) 5 MG tablet [Pharmacy Med Name: Levocetirizine Dihydrochloride 5 MG Oral Tablet] 120 tablet 0    Sig: TAKE 1 TABLET BY MOUTH IN THE  EVENING     Ear, Nose, and Throat:  Antihistamines - levocetirizine dihydrochloride Failed - 12/07/2022  3:58 PM      Failed - Valid encounter within last 12 months    Recent Outpatient Visits           1 year ago Benign essential HTN   [facility] [place], Priscille Heidelberg, MD   1 year ago Rash   [facility] Valentino Nose, NP   1 year ago Rash   [facility] Valentino Nose, NP   2 years ago Benign essential HTN   [facility] Tanya Nones, Priscille Heidelberg, MD   2 years ago Dermatitis due to plants, including poison ivy, sumac, and oak   [facility] [place], Velna Hatchet, MD              Passed - Cr in normal range and within 360 days    Creat  Date Value Ref Range Status  06/12/2022 0.81 0.70 - 1.28 mg/dL Final         Passed - eGFR is 10 or above and within 360 days    GFR, Est African American  Date Value Ref Range Status  06/15/2020 105 > OR = 60 mL/min/1.20m2 Final   GFR, Est Non African American  Date Value Ref Range Status  06/15/2020 90 > OR = 60 mL/min/1.18m2 Final   eGFR  Date Value Ref Range Status  06/12/2022 95 > OR = 60 mL/min/1.47m2 Final

## 2022-12-25 ENCOUNTER — Other Ambulatory Visit: Payer: Self-pay | Admitting: Family Medicine

## 2022-12-26 NOTE — Telephone Encounter (Signed)
Requested Prescriptions  Pending Prescriptions Disp Refills   losartan (COZAAR) 100 MG tablet [Pharmacy Med Name: Losartan Potassium 100 MG Oral Tablet] 120 tablet 1    Sig: TAKE 1 TABLET BY MOUTH IN THE  MORNING     Cardiovascular:  Angiotensin Receptor Blockers Failed - 12/25/2022  1:08 AM      Failed - Cr in normal range and within 180 days    Creat  Date Value Ref Range Status  06/12/2022 0.81 0.70 - 1.28 mg/dL Final         Failed - K in normal range and within 180 days    Potassium  Date Value Ref Range Status  06/12/2022 4.2 3.5 - 5.3 mmol/L Final         Failed - Valid encounter within last 6 months    Recent Outpatient Visits           1 year ago Benign essential HTN   [facility] Donita Brooks, MD   1 year ago Rash   [facility] Valentino Nose, NP   1 year ago Rash   [facility] Valentino Nose, NP   2 years ago Benign essential HTN   [facility] Tanya Nones, Priscille Heidelberg, MD   2 years ago Dermatitis due to plants, including poison ivy, sumac, and oak   [facility] [place], Velna Hatchet, MD              Passed - Patient is not pregnant      Passed - Last BP in normal range    BP Readings from Last 1 Encounters:  07/22/22 136/82

## 2023-02-21 ENCOUNTER — Other Ambulatory Visit: Payer: Self-pay | Admitting: Family Medicine

## 2023-02-21 DIAGNOSIS — K222 Esophageal obstruction: Secondary | ICD-10-CM

## 2023-02-21 DIAGNOSIS — T7840XA Allergy, unspecified, initial encounter: Secondary | ICD-10-CM

## 2023-04-10 ENCOUNTER — Ambulatory Visit: Payer: Self-pay | Admitting: Family Medicine

## 2023-04-10 ENCOUNTER — Other Ambulatory Visit: Payer: Self-pay | Admitting: Family Medicine

## 2023-04-10 ENCOUNTER — Encounter: Payer: Self-pay | Admitting: Family Medicine

## 2023-04-10 MED ORDER — OSELTAMIVIR PHOSPHATE 75 MG PO CAPS: 75.0000 mg | ORAL_CAPSULE | Freq: Every day | ORAL | 0 refills | Status: DC

## 2023-04-10 NOTE — Telephone Encounter (Signed)
 Copied from CRM [id number]. Topic: Clinical - Red Word Triage >> Apr 10, 2023 10:45 AM Laurier BROCKS wrote: Red Word that prompted transfer to Nurse Triage: Patient has been with someone who was diagnosed with the Flu. Patient states he has been vomiting and feeling really nauseous.   Chief Complaint: flu exposure Symptoms: vomiting Frequency: one episode Pertinent Negatives: Patient denies fever at this time Disposition: [] ED /[] Urgent Care (no appt availability in office) / [] Appointment(In office/virtual)/ []  [place] [facility]/ [] Home Care/ [] Refused Recommended Disposition /[] [place] [facility]/ [x]  Follow-up with PCP Additional Notes: Patient called and advised that he has been taking care of a relative in their 90s who he and his wife took to the emergency room last night who was diagnosed with Flu A.  Patient states that he and his wife were just on the way to pick up this relatives Tamiflu  when this patient started to feel bad and vomited once.  Patient states he sent Dr Duanne a message this morning on MyChart about Tamiflu  and when he vomited he decided he would call the office about it.  Patient was asking if he could just get Tamiflu  sent in to the pharmacy for himself.  He was advised that I would send this to his PCP and if we needed anything further someone would contact him.  This RN also advised him that if he felt worse to go to the emergency room and if he had any other questions to call us  back.  Patient verbalized understanding.  Reason for Disposition  [1] Influenza EXPOSURE (Close Contact) within last 48 hours (2 days) AND [2] exposed person is HIGH RISK (e.g., age > 64 years, pregnant, HIV+, chronic medical condition)  Answer Assessment - Initial Assessment Questions 1. TYPE of EXPOSURE: How were you exposed? (e.g., close contact, not a close contact)     Taking care of relative who was diagnosed last night with Flu A 2. DATE of EXPOSURE: When did the exposure occur?  (e.g., hour, days, weeks)     Every day all day 4. HIGH RISK for COMPLICATIONS: Do you have any heart or lung problems? Do you have a weakened immune system? (e.g., CHF, COPD, asthma, HIV positive, chemotherapy, renal failure, diabetes mellitus, sickle cell anemia)     Hypertension 5. SYMPTOMS: Do you have any symptoms? (e.g., cough, fever, sore throat, difficulty breathing).     Vomiting  Protocols used: Influenza (Flu) Exposure-A-AH

## 2023-05-19 ENCOUNTER — Ambulatory Visit: Admitting: Family Medicine

## 2023-05-19 ENCOUNTER — Ambulatory Visit: Payer: Self-pay | Admitting: Family Medicine

## 2023-05-19 VITALS — BP 170/80 | HR 84 | Temp 98.5°F | Ht 70.0 in | Wt 198.1 lb

## 2023-05-19 DIAGNOSIS — I10 Essential (primary) hypertension: Secondary | ICD-10-CM

## 2023-05-19 DIAGNOSIS — T7840XA Allergy, unspecified, initial encounter: Secondary | ICD-10-CM | POA: Diagnosis not present

## 2023-05-19 MED ORDER — PREDNISONE 20 MG PO TABS: ORAL_TABLET | ORAL | 0 refills | Status: DC

## 2023-05-19 NOTE — Assessment & Plan Note (Signed)
 BP elevated today in office in setting of acute allergic reaction. Will continue to monitor and return to office if sustains >140/90. Seek immediate medical care for chest pain, palpitations, recurrent headaches, vision changes, lightheadedness, dizziness, dyspnea on exertion, or swelling of extremities.

## 2023-05-19 NOTE — Telephone Encounter (Signed)
 Communication  Red Word that prompted transfer to Nurse Triage: Face is swollen, eyes very red, might be from tree sap                 Chief Complaint: Pt. Was around trees and sap this weekend. Facial swelling."This has happened to me before." Eyes and face swollen, severe itching. Symptoms: Above Frequency: Weekend Pertinent Negatives: Patient denies SOB or difficulty swallowing. Disposition: [] ED /[] Urgent Care (no appt availability in office) / [x] Appointment(In office/virtual)/ []  [place] [facility]/ [] Home Care/ [] Refused Recommended Disposition /[] [place] [facility]/ []  Follow-up with PCP Additional Notes: Prefers morning appointment.  Reason for Disposition  SEVERE swelling of the entire face  Answer Assessment - Initial Assessment Questions 1. ONSET: "When did the swelling start?" (e.g., minutes, hours, days)     Weekend 2. LOCATION: "What part of the face is swollen?"     Face - Eyes 3. SEVERITY: "How swollen is it?"     Severe 4. ITCHING: "Is there any itching?" If Yes, ask: "How much?"   (Scale 1-10; mild, moderate or severe)     Severe 5. PAIN: "Is the swelling painful to touch?" If Yes, ask: "How painful is it?"   (Scale 1-10; mild, moderate or severe)   - NONE (0): no pain   - MILD (1-3): doesn't interfere with normal activities    - MODERATE (4-7): interferes with normal activities or awakens from sleep    - SEVERE (8-10): excruciating pain, unable to do any normal activities      No 6. FEVER: "Do you have a fever?" If Yes, ask: "What is it, how was it measured, and when did it start?"      No 7. CAUSE: "What do you think is causing the face swelling?"     Reaction to tree sap 8. RECURRENT SYMPTOM: "Have you had face swelling before?" If Yes, ask: "When was the last time?" "What happened that time?"     Yes 9. OTHER SYMPTOMS: "Do you have any other symptoms?" (e.g., toothache, leg swelling)     No 10. PREGNANCY: "Is there any chance you are pregnant?"  "When was your last menstrual period?"       N/a  Protocols used: Face Swelling-A-AH

## 2023-05-19 NOTE — Progress Notes (Signed)
 Subjective:  HPI: [name] is a 73 y.o. male presenting on 05/19/2023 for Allergic Reaction (Pt thinks he has had a reaction to wood sap/straw. Sx started yesterday. )   Allergic Reaction   Patient is in today for allergic reaction over the weekend due to exposure to raw sap yesterday. He does have a known history of this allergy. He was camping this weekend and had some facial swelling and itching. Denies throat or tongue swelling, difficulty breathing., SOB, wheezing.  Was evaluated by EMS and his BP was 207/90. He is taking Amlodipine 10mg  daily, Doxazosin 4mg  and Losartan 100mg  daily. His facial swelling is improving with Benadryl and Tagamet. Home BP readings have been 175/82 last night. Denies chest pain, palpitations, dyspnea, vision changes, headaches, swelling of extremities.     Review of Systems  All other systems reviewed and are negative.   Relevant past medical history reviewed and updated as indicated.   Past Medical History:  Diagnosis Date   Allergy    wood sap   Allergy history, seafood    oysters, mussels   Arthritis    neck   Bee sting allergy 1980   hives after 20 yellowjacket stings   Cancer (HCC)    "skin cancer frozen off"   Colon polyps    q 3 years, last colonoscopy 03/12/18   GERD (gastroesophageal reflux disease)    Hypercholesteremia    Hypertension      Past Surgical History:  Procedure Laterality Date   CARPAL TUNNEL RELEASE Right 1985   elbow   SHOULDER ARTHROSCOPY WITH SUBACROMIAL DECOMPRESSION AND OPEN ROTATOR C Right 10/22/2012   Procedure: RIGHT SHOULDER ARTHROSCOPY WITH SUBACROMIAL DECOMPRESSION AND  ROTATOR CUFF REPAIR,  CORACOACROMIAL LIGAMENT RELEASE AND  MANIPULATION UNDER ANESTHESIA;  Surgeon: Javier Docker, MD;  Location: [facility];  Service: Orthopedics;  Laterality: Right;    Allergies and medications reviewed and updated.   Current Outpatient Medications:    amLODipine (NORVASC) 10 MG tablet, TAKE 1 TABLET BY  MOUTH EVERY DAY, Disp: 90 tablet, Rfl: 3   doxazosin (CARDURA) 4 MG tablet, TAKE 1 TABLET BY MOUTH DAILY, Disp: 120 tablet, Rfl: 2   HYDROcodone bit-homatropine (HYCODAN) 5-1.5 MG/5ML syrup, Take 5 mLs by mouth every 8 (eight) hours as needed for cough., Disp: 120 mL, Rfl: 0   KLOR-CON M20 20 MEQ tablet, TAKE 1 TABLET BY MOUTH EVERY DAY, Disp: 90 tablet, Rfl: 3   levocetirizine (XYZAL) 5 MG tablet, TAKE 1 TABLET BY MOUTH IN THE  EVENING, Disp: 120 tablet, Rfl: 0   losartan (COZAAR) 100 MG tablet, TAKE 1 TABLET BY MOUTH IN THE  MORNING, Disp: 120 tablet, Rfl: 1   oseltamivir (TAMIFLU) 75 MG capsule, Take 1 capsule (75 mg total) by mouth daily. Dx: Z20.828- exposure to influenza, Disp: 7 capsule, Rfl: 0   pantoprazole (PROTONIX) 40 MG tablet, TAKE 1 TABLET BY MOUTH DAILY, Disp: 120 tablet, Rfl: 2   predniSONE (DELTASONE) 20 MG tablet, 3 tabs poqday 1-2, 2 tabs poqday 3-4, 1 tab poqday 5-6, Disp: 12 tablet, Rfl: 0   rosuvastatin (CRESTOR) 10 MG tablet, TAKE 1 TABLET BY MOUTH DAILY, Disp: 90 tablet, Rfl: 2   zafirlukast (ACCOLATE) 20 MG tablet, TAKE 1 TABLET BY MOUTH TWICE  DAILY BEFORE MEALS, Disp: 240 tablet, Rfl: 2  Allergies  Allergen Reactions   Iodine     Topical antiseptic-as a young adult. Don't know reaction   Other     Wood Sap causes Hives  Objective:   BP (!) 170/80   Pulse 84   Temp 98.5 F (36.9 C)   Ht 5\' 10"  (1.778 m)   Wt 198 lb 2 oz (89.9 kg)   SpO2 99%   BMI 28.43 kg/m      05/19/2023   10:20 AM 07/22/2022    3:25 PM 06/20/2022    9:19 AM  Vitals with BMI  Height 5\' 10"  5\' 10"  5\' 10"   Weight 198 lbs 2 oz 198 lbs 13 oz 196 lbs  BMI 28.43 28.52 28.12  Systolic 170 136 045  Diastolic 80 82 72  Pulse 84 64 74     Physical Exam Vitals and nursing note reviewed.  Constitutional:      Appearance: Normal appearance. He is normal weight.  HENT:     Head: Normocephalic and atraumatic.  Cardiovascular:     Rate and Rhythm: Normal rate and regular rhythm.      Pulses: Normal pulses.     Heart sounds: Normal heart sounds.  Pulmonary:     Effort: Pulmonary effort is normal.     Breath sounds: Normal breath sounds.  Skin:    General: Skin is warm and dry.     Capillary Refill: Capillary refill takes less than 2 seconds.     Findings: Erythema present.  Neurological:     General: No focal deficit present.     Mental Status: He is alert and oriented to person, place, and time. Mental status is at baseline.  Psychiatric:        Mood and Affect: Mood normal.        Behavior: Behavior normal.        Thought Content: Thought content normal.        Judgment: Judgment normal.     Assessment & Plan:  Acute allergic reaction, initial encounter Assessment & Plan: Start prednisone taper pak. Patient would like to eventually see an allergy specialist for reevaluation as his allergies seem to be worsening in severity however would like to defer. Discussed red flags and when to proceed to ED including tongue swelling, throat swelling, SOB. Follow up with PCP if BP remains elevated, continue to monitor at home   Primary hypertension Assessment & Plan: BP elevated today in office in setting of acute allergic reaction. Will continue to monitor and return to office if sustains >140/90. Seek immediate medical care for chest pain, palpitations, recurrent headaches, vision changes, lightheadedness, dizziness, dyspnea on exertion, or swelling of extremities.    Other orders -     predniSONE; 3 tabs poqday 1-2, 2 tabs poqday 3-4, 1 tab poqday 5-6  Dispense: 12 tablet; Refill: 0     Follow up plan: Return if symptoms worsen or fail to improve.  Park Meo, FNP

## 2023-05-19 NOTE — Assessment & Plan Note (Addendum)
 Start prednisone taper pak. Patient would like to eventually see an allergy specialist for reevaluation as his allergies seem to be worsening in severity however would like to defer. Discussed red flags and when to proceed to ED including tongue swelling, throat swelling, SOB. Follow up with PCP if BP remains elevated, continue to monitor at home

## 2023-05-21 ENCOUNTER — Other Ambulatory Visit: Payer: Self-pay | Admitting: Family Medicine

## 2023-05-21 DIAGNOSIS — I10 Essential (primary) hypertension: Secondary | ICD-10-CM

## 2023-05-22 NOTE — Telephone Encounter (Signed)
 Requested Prescriptions  Pending Prescriptions Disp Refills   pantoprazole (PROTONIX) 40 MG tablet [Pharmacy Med Name: Pantoprazole Sodium 40 MG Oral Tablet Delayed Release] 90 tablet 0    Sig: TAKE 1 TABLET BY MOUTH DAILY     Gastroenterology: Proton Pump Inhibitors Failed - 05/22/2023 10:31 AM      Failed - Valid encounter within last 12 months    Recent Outpatient Visits           1 year ago Benign essential HTN   [facility] [place], Priscille Heidelberg, MD   2 years ago Rash   [facility] Valentino Nose, NP   2 years ago Rash   [facility] Valentino Nose, NP   2 years ago Benign essential HTN   [facility] Donita Brooks, MD   3 years ago Dermatitis due to plants, including poison ivy, sumac, and oak   [facility] [place], Velna Hatchet, MD               amLODipine (NORVASC) 10 MG tablet [Pharmacy Med Name: amLODIPine Besylate 10 MG Oral Tablet] 90 tablet 0    Sig: TAKE 1 TABLET BY MOUTH DAILY     Cardiovascular: Calcium Channel Blockers 2 Failed - 05/22/2023 10:31 AM      Failed - Last BP in normal range    BP Readings from Last 1 Encounters:  05/19/23 (!) 170/80         Failed - Valid encounter within last 6 months    Recent Outpatient Visits           1 year ago Benign essential HTN   [facility] Donita Brooks, MD   2 years ago Rash   [facility] Valentino Nose, NP   2 years ago Rash   [facility] Valentino Nose, NP   2 years ago Benign essential HTN   [facility] [place], Priscille Heidelberg, MD   3 years ago Dermatitis due to plants, including poison ivy, sumac, and oak   [facility] [place], Velna Hatchet, MD              Passed - Last Heart Rate in normal range    Pulse Readings from Last 1 Encounters:  05/19/23 84

## 2023-05-26 ENCOUNTER — Encounter: Payer: Self-pay | Admitting: Family Medicine

## 2023-05-29 ENCOUNTER — Ambulatory Visit: Admitting: Family Medicine

## 2023-05-29 ENCOUNTER — Encounter: Payer: Self-pay | Admitting: Family Medicine

## 2023-05-29 VITALS — BP 126/62 | HR 74 | Temp 98.7°F | Ht 70.0 in | Wt 194.0 lb

## 2023-05-29 DIAGNOSIS — T7840XD Allergy, unspecified, subsequent encounter: Secondary | ICD-10-CM | POA: Diagnosis not present

## 2023-05-29 MED ORDER — HYDROCHLOROTHIAZIDE 25 MG PO TABS: 25.0000 mg | ORAL_TABLET | Freq: Every day | ORAL | 3 refills | Status: DC

## 2023-05-29 MED ORDER — PREDNISONE 20 MG PO TABS: ORAL_TABLET | ORAL | 1 refills | Status: DC

## 2023-05-29 MED ORDER — FLUTICASONE PROPIONATE 50 MCG/ACT NA SUSP: 2.0000 | Freq: Every day | NASAL | 11 refills | Status: AC

## 2023-05-29 MED ORDER — HYDROXYZINE PAMOATE 25 MG PO CAPS: 25.0000 mg | ORAL_CAPSULE | Freq: Three times a day (TID) | ORAL | 1 refills | Status: AC | PRN

## 2023-05-29 NOTE — Progress Notes (Signed)
 Subjective:    Patient ID: [name], male    DOB: 04-13-1950, 73 y.o.   MRN: [id number]  Patient recently had an allergic reaction.  He was handling firewood when he developed a rash on his hands and on his face.  He noticed itching and swelling around his eyelids.  He noticed itching and a rash develop on both forearms.  This happened to him once before while handling firewood.  He believes that he is allergic to tree Sapp of certain species.  He denies any swelling of his tongue, swelling of his lips, or swelling of his throat.  He is currently on Xyzal and Accolate daily.  He is not taking Flonase.  Patient has been on losartan for years.  Today there is no visible swelling of his tongue, his lips, or his eyes. Past Medical History:  Diagnosis Date   Allergy    wood sap   Allergy history, seafood    oysters, mussels   Arthritis    neck   Bee sting allergy 1980   hives after 20 yellowjacket stings   Cancer (HCC)    "skin cancer frozen off"   Colon polyps    q 3 years, last colonoscopy 03/12/18   GERD (gastroesophageal reflux disease)    Hypercholesteremia    Hypertension    Past Surgical History:  Procedure Laterality Date   CARPAL TUNNEL RELEASE Right 1985   elbow   SHOULDER ARTHROSCOPY WITH SUBACROMIAL DECOMPRESSION AND OPEN ROTATOR C Right 10/22/2012   Procedure: RIGHT SHOULDER ARTHROSCOPY WITH SUBACROMIAL DECOMPRESSION AND  ROTATOR CUFF REPAIR,  CORACOACROMIAL LIGAMENT RELEASE AND  MANIPULATION UNDER ANESTHESIA;  Surgeon: Javier Docker, MD;  Location: [facility];  Service: Orthopedics;  Laterality: Right;   Current Outpatient Medications on File Prior to Visit  Medication Sig Dispense Refill   amLODipine (NORVASC) 10 MG tablet TAKE 1 TABLET BY MOUTH DAILY 90 tablet 0   doxazosin (CARDURA) 4 MG tablet TAKE 1 TABLET BY MOUTH DAILY 120 tablet 2   HYDROcodone bit-homatropine (HYCODAN) 5-1.5 MG/5ML syrup Take 5 mLs by mouth every 8 (eight) hours as needed for cough. 120 mL 0    KLOR-CON M20 20 MEQ tablet TAKE 1 TABLET BY MOUTH EVERY DAY 90 tablet 3   levocetirizine (XYZAL) 5 MG tablet TAKE 1 TABLET BY MOUTH IN THE  EVENING 120 tablet 0   losartan (COZAAR) 100 MG tablet TAKE 1 TABLET BY MOUTH IN THE  MORNING 120 tablet 1   oseltamivir (TAMIFLU) 75 MG capsule Take 1 capsule (75 mg total) by mouth daily. Dx: Z20.828- exposure to influenza 7 capsule 0   pantoprazole (PROTONIX) 40 MG tablet TAKE 1 TABLET BY MOUTH DAILY 90 tablet 0   predniSONE (DELTASONE) 20 MG tablet 3 tabs poqday 1-2, 2 tabs poqday 3-4, 1 tab poqday 5-6 12 tablet 0   rosuvastatin (CRESTOR) 10 MG tablet TAKE 1 TABLET BY MOUTH DAILY 90 tablet 2   zafirlukast (ACCOLATE) 20 MG tablet TAKE 1 TABLET BY MOUTH TWICE  DAILY BEFORE MEALS 240 tablet 2   No current facility-administered medications on file prior to visit.   Allergies  Allergen Reactions   Iodine     Topical antiseptic-as a young adult. Don't know reaction   Other     Wood Sap causes Hives   Social History   Socioeconomic History   Marital status: Married    Spouse name: Not on file   Number of children: Not on file   Years of education:  Not on file   Highest education level: Bachelor's degree (e.g., BA, AB, BS)  Occupational History   Not on file  Tobacco Use   Smoking status: Former    Current packs/day: 0.00    Average packs/day: 1 pack/day for 15.0 years (15.0 ttl pk-yrs)    Types: Cigarettes    Start date: 03/04/1969    Quit date: 03/04/1984    Years since quitting: 39.2   Smokeless tobacco: Former    Types: Chew    Quit date: 03/04/2008  Substance and Sexual Activity   Alcohol use: No   Drug use: No   Sexual activity: Not on file  Other Topics Concern   Not on file  Social History Narrative   Not on file   Social Drivers of Health   Financial Resource Strain: Low Risk  (05/19/2023)   Overall Financial Resource Strain (CARDIA)    Difficulty of Paying Living Expenses: Not hard at all  Food Insecurity: No Food Insecurity  (05/19/2023)   Hunger Vital Sign    Worried About Running Out of Food in the Last Year: Never true    Ran Out of Food in the Last Year: Never true  Transportation Needs: No Transportation Needs (05/19/2023)   PRAPARE - Administrator, Civil Service (Medical): No    Lack of Transportation (Non-Medical): No  Physical Activity: Sufficiently Active (05/19/2023)   Exercise Vital Sign    Days of Exercise per Week: 5 days    Minutes of Exercise per Session: 30 min  Stress: No Stress Concern Present (05/19/2023)   [employer] of Occupational Health - Occupational Stress Questionnaire    Feeling of Stress : Not at all  Social Connections: Socially Integrated (05/19/2023)   Social Connection and Isolation Panel [NHANES]    Frequency of Communication with Friends and Family: More than three times a week    Frequency of Social Gatherings with Friends and Family: Three times a week    Attends Religious Services: More than 4 times per year    Active Member of Clubs or Organizations: No    Attends Engineer, structural: More than 4 times per year    Marital Status: Married  Catering manager Violence: Not on file     Review of Systems  Musculoskeletal:  Positive for back pain.  Skin:  Positive for rash.  All other systems reviewed and are negative.      Objective:    Physical Exam Constitutional:      Appearance: Normal appearance. He is normal weight.  Cardiovascular:     Rate and Rhythm: Normal rate and regular rhythm.     Heart sounds: Normal heart sounds. No murmur heard.    No friction rub.  Pulmonary:     Effort: Pulmonary effort is normal. No respiratory distress.     Breath sounds: Normal breath sounds. No wheezing, rhonchi or rales.  Neurological:     Mental Status: He is alert.       Assessment & Plan:  Acute allergic reaction, subsequent encounter First, the patient's blood pressure at home is extremely high with systolic blood pressures in the 170s.   Resume hydrochlorothiazide 25 mg daily and recheck blood pressure in 2 weeks.  Second, the patient is currently on Xyzal and Accolate.  I recommended adding Flonase 2 sprays each nostril daily for seasonal allergies.  If he has another acute allergic reaction, he can use hydroxyzine 25 mg every 8 hours as needed and Pepcid 40 mg  daily.  I gave the patient a prescription for prednisone to have at home if he has a severe allergic reaction.  If this happens rarely, I believe that I would just treated with the prednisone.  However if he notices swelling in his lips or tongue, I want him to discontinue losartan immediately and we would also contact an allergist

## 2023-06-22 ENCOUNTER — Other Ambulatory Visit: Payer: Self-pay | Admitting: Family Medicine

## 2023-06-23 ENCOUNTER — Ambulatory Visit: Payer: Medicare Other | Admitting: Family Medicine

## 2023-06-23 VITALS — BP 146/78 | HR 66 | Temp 97.9°F | Ht 70.0 in | Wt 203.0 lb

## 2023-06-23 DIAGNOSIS — I10 Essential (primary) hypertension: Secondary | ICD-10-CM | POA: Diagnosis not present

## 2023-06-23 DIAGNOSIS — Z0000 Encounter for general adult medical examination without abnormal findings: Secondary | ICD-10-CM

## 2023-06-23 DIAGNOSIS — Z0001 Encounter for general adult medical examination with abnormal findings: Secondary | ICD-10-CM | POA: Diagnosis not present

## 2023-06-23 DIAGNOSIS — E7800 Pure hypercholesterolemia, unspecified: Secondary | ICD-10-CM | POA: Diagnosis not present

## 2023-06-23 DIAGNOSIS — Z125 Encounter for screening for malignant neoplasm of prostate: Secondary | ICD-10-CM | POA: Diagnosis not present

## 2023-06-23 LAB — COMPLETE METABOLIC PANEL WITHOUT GFR
AG Ratio: 1.8 (calc) (ref 1.0–2.5)
ALT: 29 U/L (ref 9–46)
AST: 28 U/L (ref 10–35)
Albumin: 4 g/dL (ref 3.6–5.1)
Alkaline phosphatase (APISO): 68 U/L (ref 35–144)
BUN: 12 mg/dL (ref 7–25)
CO2: 31 mmol/L (ref 20–32)
Calcium: 9 mg/dL (ref 8.6–10.3)
Chloride: 102 mmol/L (ref 98–110)
Creat: 0.78 mg/dL (ref 0.70–1.28)
Globulin: 2.2 g/dL (ref 1.9–3.7)
Glucose, Bld: 98 mg/dL (ref 65–99)
Potassium: 3.3 mmol/L — ABNORMAL LOW (ref 3.5–5.3)
Sodium: 142 mmol/L (ref 135–146)
Total Bilirubin: 0.5 mg/dL (ref 0.2–1.2)
Total Protein: 6.2 g/dL (ref 6.1–8.1)

## 2023-06-23 LAB — LIPID PANEL
Cholesterol: 154 mg/dL (ref <200)
HDL: 58 mg/dL (ref >=40)
LDL Cholesterol (Calc): 82 mg/dL
Non-HDL Cholesterol (Calc): 96 mg/dL (ref <130)
Total CHOL/HDL Ratio: 2.7 (calc) (ref <5.0)
Triglycerides: 63 mg/dL (ref <150)

## 2023-06-23 LAB — PSA: PSA: 1.1 ng/mL (ref <=4.00)

## 2023-06-23 LAB — CBC WITH DIFFERENTIAL/PLATELET
Absolute Lymphocytes: 1646 {cells}/uL (ref 850–3900)
Absolute Monocytes: 686 {cells}/uL (ref 200–950)
Basophils Absolute: 94 {cells}/uL (ref 0–200)
Basophils Relative: 1.2 %
Eosinophils Absolute: 117 {cells}/uL (ref 15–500)
Eosinophils Relative: 1.5 %
HCT: 44.9 % (ref 38.5–50.0)
Hemoglobin: 14.9 g/dL (ref 13.2–17.1)
MCH: 32.2 pg (ref 27.0–33.0)
MCHC: 33.2 g/dL (ref 32.0–36.0)
MCV: 97 fL (ref 80.0–100.0)
MPV: 10.6 fL (ref 7.5–12.5)
Monocytes Relative: 8.8 %
Neutro Abs: 5257 {cells}/uL (ref 1500–7800)
Neutrophils Relative %: 67.4 %
Platelets: 245 10*3/uL (ref 140–400)
RBC: 4.63 10*6/uL (ref 4.20–5.80)
RDW: 12 % (ref 11.0–15.0)
Total Lymphocyte: 21.1 %
WBC: 7.8 10*3/uL (ref 3.8–10.8)

## 2023-06-23 NOTE — Progress Notes (Signed)
 Subjective:   [name] is a 73 y.o. male who presents for Medicare Annual/Subsequent preventive examination.  Visit Complete: In person  Patient Medicare AWV questionnaire was completed by the patient on 06/23/23; I have confirmed that all information answered by patient is correct and no changes since this date.  Cardiac Risk Factors include: advanced age (>70men, >68 women);dyslipidemia;hypertension;male gender;sedentary lifestyle;obesity (BMI >30kg/m2)     Objective:    Today's Vitals   06/23/23 0832  BP: 130/78  Pulse: 66  Temp: 97.9 F (36.6 C)  SpO2: 99%  Weight: 203 lb (92.1 kg)  Height: 5\' 10"  (1.778 m)   Body mass index is 29.13 kg/m.     06/23/2023    8:42 AM 06/15/2020    8:29 AM 09/10/2014    9:25 AM 10/16/2012    2:58 PM  Advanced Directives  Does Patient Have a Medical Advance Directive? Yes No No Patient does not have advance directive;Patient would not like information  Type of Public librarian Power of [place]ity;Living will     Does patient want to make changes to medical advance directive?  No - Patient declined    Copy of Healthcare Power of Attorney in Chart? No - copy requested     Would patient like information on creating a medical advance directive?  No - Patient declined    Pre-existing out of facility DNR order (yellow form or pink MOST form)    No    Current Medications (verified) Outpatient Encounter Medications as of 06/23/2023  Medication Sig   amLODipine  (NORVASC ) 10 MG tablet TAKE 1 TABLET BY MOUTH DAILY   doxazosin  (CARDURA ) 4 MG tablet TAKE 1 TABLET BY MOUTH DAILY   fluticasone  (FLONASE ) 50 MCG/ACT nasal spray Place 2 sprays into both nostrils daily.   hydrochlorothiazide  (HYDRODIURIL ) 25 MG tablet Take 1 tablet (25 mg total) by mouth daily.   HYDROcodone  bit-homatropine (HYCODAN) 5-1.5 MG/5ML syrup Take 5 mLs by mouth every 8 (eight) hours as needed for cough.   hydrOXYzine  (VISTARIL ) 25 MG capsule Take 1 capsule (25  mg total) by mouth every 8 (eight) hours as needed for itching (allergic reaction).   KLOR-CON  M20 20 MEQ tablet TAKE 1 TABLET BY MOUTH EVERY DAY   levocetirizine (XYZAL ) 5 MG tablet TAKE 1 TABLET BY MOUTH IN THE  EVENING   losartan  (COZAAR ) 100 MG tablet TAKE 1 TABLET BY MOUTH IN THE  MORNING   oseltamivir  (TAMIFLU ) 75 MG capsule Take 1 capsule (75 mg total) by mouth daily. Dx: Z20.828- exposure to influenza   pantoprazole  (PROTONIX ) 40 MG tablet TAKE 1 TABLET BY MOUTH DAILY   predniSONE  (DELTASONE ) 20 MG tablet 3 tabs poqday 1-2, 2 tabs poqday 3-4, 1 tab poqday 5-6   predniSONE  (DELTASONE ) 20 MG tablet 3 tabs poqday 1-2, 2 tabs poqday 3-4, 1 tab poqday 5-6   rosuvastatin  (CRESTOR ) 10 MG tablet TAKE 1 TABLET BY MOUTH DAILY   zafirlukast  (ACCOLATE ) 20 MG tablet TAKE 1 TABLET BY MOUTH TWICE  DAILY BEFORE MEALS   No facility-administered encounter medications on file as of 06/23/2023.    Allergies (verified) Iodine and Other   History: Past Medical History:  Diagnosis Date   Allergy    wood sap   Allergy history, seafood    oysters, mussels   Arthritis    neck   Bee sting allergy 1980   hives after 20 yellowjacket stings   Cancer (HCC)    "skin cancer frozen off"   Colon polyps  q 3 years, last colonoscopy 03/12/18   GERD (gastroesophageal reflux disease)    Hypercholesteremia    Hypertension    Past Surgical History:  Procedure Laterality Date   CARPAL TUNNEL RELEASE Right 1985   elbow   SHOULDER ARTHROSCOPY WITH SUBACROMIAL DECOMPRESSION AND OPEN ROTATOR C Right 10/22/2012   Procedure: RIGHT SHOULDER ARTHROSCOPY WITH SUBACROMIAL DECOMPRESSION AND  ROTATOR CUFF REPAIR,  CORACOACROMIAL LIGAMENT RELEASE AND  MANIPULATION UNDER ANESTHESIA;  Surgeon: Loel Ring, MD;  Location: [facility];  Service: Orthopedics;  Laterality: Right;   Family History  Problem Relation Age of Onset   Heart failure Mother    Heart failure Father    Social History   Socioeconomic History    Marital status: Married    Spouse name: Not on file   Number of children: Not on file   Years of education: Not on file   Highest education level: Bachelor's degree (e.g., BA, AB, BS)  Occupational History   Not on file  Tobacco Use   Smoking status: Former    Current packs/day: 0.00    Average packs/day: 1 pack/day for 15.0 years (15.0 ttl pk-yrs)    Types: Cigarettes    Start date: 03/04/1969    Quit date: 03/04/1984    Years since quitting: 39.3   Smokeless tobacco: Former    Types: Chew    Quit date: 03/04/2008  Substance and Sexual Activity   Alcohol use: No   Drug use: No   Sexual activity: Not on file  Other Topics Concern   Not on file  Social History Narrative   Not on file   Social Drivers of Health   Financial Resource Strain: Low Risk  (06/23/2023)   Overall Financial Resource Strain (CARDIA)    Difficulty of Paying Living Expenses: Not hard at all  Food Insecurity: No Food Insecurity (06/23/2023)   Hunger Vital Sign    Worried About Running Out of Food in the Last Year: Never true    Ran Out of Food in the Last Year: Never true  Transportation Needs: No Transportation Needs (06/23/2023)   PRAPARE - Administrator, Civil Service (Medical): No    Lack of Transportation (Non-Medical): No  Physical Activity: Sufficiently Active (06/23/2023)   Exercise Vital Sign    Days of Exercise per Week: 5 days    Minutes of Exercise per Session: 30 min  Stress: No Stress Concern Present (06/23/2023)   [employer] of Occupational Health - Occupational Stress Questionnaire    Feeling of Stress : Not at all  Social Connections: Socially Integrated (06/23/2023)   Social Connection and Isolation Panel [NHANES]    Frequency of Communication with Friends and Family: More than three times a week    Frequency of Social Gatherings with Friends and Family: More than three times a week    Attends Religious Services: More than 4 times per year    Active Member of [employer] or  Organizations: Yes    Attends Engineer, structural: More than 4 times per year    Marital Status: Married    Tobacco Counseling Counseling given: Not Answered   Clinical Intake:  Pre-visit preparation completed: Yes  Pain : No/denies pain     BMI - recorded: 29.13 Nutritional Status: BMI 25 -29 Overweight Nutritional Risks: None Diabetes: No  How often do you need to have someone help you when you read instructions, pamphlets, or other written materials from your doctor or pharmacy?: 1 - Never  Interpreter Needed?: No  Information entered by :: mj Orvill Coulthard, lpn   Activities of Daily Living    06/23/2023    8:43 AM 06/19/2023    8:57 AM  In your present state of health, do you have any difficulty performing the following activities:  Hearing? 0 0  Vision? 0 0  Difficulty concentrating or making decisions? 0 0  Walking or climbing stairs? 0 0  Dressing or bathing? 0 0  Doing errands, shopping? 0 0  Preparing Food and eating ? N N  Using the Toilet? N N  In the past six months, have you accidently leaked urine? N N  Do you have problems with loss of bowel control? N N  Managing your Medications? N N  Managing your Finances? N N  Housekeeping or managing your Housekeeping? N N    Patient Care Team: Austine Lefort, MD as PCP - General (Family Medicine)  Indicate any recent Medical Services you may have received from other than [facility] providers in the past year (date may be approximate).     Assessment:   This is a routine wellness examination for [name].  Hearing/Vision screen Hearing Screening - Comments:: Wears hearing aids Vision Screening - Comments:: Wears glasses. Dr. Roslynn Coombes at [facility].   Goals Addressed             This Visit's Progress    Exercise 150 min/wk Moderate Activity       Continue to exercise.        Depression Screen    06/23/2023    8:40 AM 06/20/2022    9:30 AM 06/18/2021    8:23 AM 12/12/2020    4:02 PM  06/15/2020    8:27 AM 12/29/2019    8:20 AM 06/15/2019   10:25 AM  PHQ 2/9 Scores  PHQ - 2 Score 0 0 0 0 0 0 0  PHQ- 9 Score   0  0      Fall Risk    06/23/2023    8:43 AM 06/19/2023    8:57 AM 06/20/2022    9:16 AM 06/18/2021    8:23 AM 12/12/2020    4:02 PM  Fall Risk   Falls in the past year? 0 0 0 0 0  Number falls in past yr: 0  0 0 0  Injury with Fall? 0  0 0 0  Risk for fall due to : No Fall Risks  No Fall Risks  No Fall Risks  Follow up Falls prevention discussed;Falls evaluation completed  Falls prevention discussed  Falls evaluation completed    MEDICARE RISK AT HOME: Medicare Risk at Home If so, are there any without handrails?: (Patient-Rptd) No Home free of loose throw rugs in walkways, pet beds, electrical cords, etc?: (Patient-Rptd) Yes Adequate lighting in your home to reduce risk of falls?: (Patient-Rptd) Yes Life alert?: (Patient-Rptd) No Use of a cane, walker or w/c?: (Patient-Rptd) No Grab bars in the bathroom?: (Patient-Rptd) Yes Shower chair or bench in shower?: (Patient-Rptd) No Elevated toilet seat or a handicapped toilet?: (Patient-Rptd) No  TIMED UP AND GO:  Was the test performed?  Yes  Length of time to ambulate 10 feet: 6 sec Gait steady and fast without use of assistive device    Cognitive Function:        Immunizations Immunization History  Administered Date(s) Administered   Fluad Quad(high Dose 65+) 02/11/2020, 12/12/2020   Influenza Split 12/16/2012   Influenza, High Dose Seasonal PF 11/25/2018   Influenza,inj,Quad PF,6+  Mos 12/27/2015, 12/11/2016   Influenza,trivalent, recombinat, inj, PF 10/21/2017   Influenza-Unspecified 01/02/2014   PFIZER(Purple Top)SARS-COV-2 Vaccination 05/09/2019, 05/31/2019   Pneumococcal Conjugate-13 06/11/2017   Pneumococcal Polysaccharide-23 05/26/2018   Tdap 01/25/2011   Zoster Recombinant(Shingrix) 07/16/2017, 10/21/2017   Zoster, Live 01/03/2014    TDAP status: Patient wishes to receive this  vaccine today after disclosing that insurance does not cover the vaccine as a preventative vaccine.   Flu Vaccine status: Up to date  Pneumococcal vaccine status: Up to date  Covid-19 vaccine status: Completed vaccines  Qualifies for Shingles Vaccine? Yes   Zostavax completed Yes   Shingrix Completed?: Yes  Screening Tests Health Maintenance  Topic Date Due   COVID-19 Vaccine (3 - 2024-25 season) 11/03/2022   INFLUENZA VACCINE  10/03/2023   Medicare Annual Wellness (AWV)  06/22/2024   Colonoscopy  03/12/2028   Pneumonia Vaccine 37+ Years old  Completed   Hepatitis C Screening  Completed   Zoster Vaccines- Shingrix  Completed   HPV VACCINES  Aged Out   Meningococcal B Vaccine  Aged Out   DTaP/Tdap/Td  Discontinued    Health Maintenance  Health Maintenance Due  Topic Date Due   COVID-19 Vaccine (3 - 2024-25 season) 11/03/2022    Colorectal cancer screening: Type of screening: Colonoscopy. Completed 03/12/2018. Repeat every 10 years  Lung Cancer Screening: (Low Dose CT Chest recommended if Age 63-80 years, 20 pack-year currently smoking OR have quit w/in 15years.) does not qualify.   Lung Cancer Screening Referral: N/A  Additional Screening:  Hepatitis C Screening: does qualify; Completed 05/12/2017  Vision Screening: Recommended annual ophthalmology exams for early detection of glaucoma and other disorders of the eye. Is the patient up to date with their annual eye exam?  Yes  Who is the provider or what is the name of the office in which the patient attends annual eye exams? DR. Roslynn Coombes  If pt is not established with a provider, would they like to be referred to a provider to establish care? No .   Dental Screening: Recommended annual dental exams for proper oral hygiene  Diabetic Foot Exam:   Community Resource Referral / Chronic Care Management: CRR required this visit?  No   CCM required this visit?  No     Plan:     I have personally reviewed and noted  the following in the patient's chart:   Medical and social history Use of alcohol, tobacco or illicit drugs  Current medications and supplements including opioid prescriptions. Patient is not currently taking opioid prescriptions. Functional ability and status Nutritional status Physical activity Advanced directives List of other physicians Hospitalizations, surgeries, and ER visits in previous 12 months Vitals Screenings to include cognitive, depression, and falls Referrals and appointments  In addition, I have reviewed and discussed with patient certain preventive protocols, quality metrics, and best practice recommendations. A written personalized care plan for preventive services as well as general preventive health recommendations were provided to patient.     Verneda Golder, LPN   [id number]   After Visit Summary: (In Person-Printed) AVS printed and given to the patient  Nurse Notes: Discussed Tdap. Pt would like to receive today.   I have collaborated with the care management provider regarding care management and care coordination activities outlined in this encounter and have reviewed this encounter including documentation in the note and care plan. I am certifying that I agree with the content of this note and encounter as supervising physician. Patient is due for  the RSV vaccine.  He is also due for a tetanus shot.  He defers these today.  His last colonoscopy was 2020.  He is due for that in 2027.  He is due for a PSA today.  His blood pressure was elevated at 146/78.  I asked the patient to check his blood pressure at home twice a day for the next week and report the values to me.  If persistently elevated I would recommend adding Nebivolol.  Check CBC, CMP, and fasting lipid panel. Objective Physical Exam Vitals reviewed.  Constitutional:      General: He is not in acute distress.    Appearance: He is well-developed. He is not diaphoretic.  HENT:     Head:  Normocephalic and atraumatic.     Right Ear: External ear normal.     Left Ear: External ear normal.     Nose: Nose normal.     Mouth/Throat:     Pharynx: No oropharyngeal exudate.  Eyes:     General: No scleral icterus.       Right eye: No discharge.        Left eye: No discharge.     Conjunctiva/sclera: Conjunctivae normal.     Pupils: Pupils are equal, round, and reactive to light.  Neck:     Thyroid : No thyromegaly.     Vascular: No JVD.     Trachea: No tracheal deviation.  Cardiovascular:     Rate and Rhythm: Normal rate and regular rhythm.     Heart sounds: Normal heart sounds. No murmur heard.    No friction rub. No gallop.  Pulmonary:     Effort: Pulmonary effort is normal. No respiratory distress.     Breath sounds: Normal breath sounds. No stridor. No wheezing or rales.  Chest:     Chest wall: No tenderness.  Abdominal:     General: Bowel sounds are normal. There is no distension.     Palpations: Abdomen is soft. There is no mass.     Tenderness: There is no abdominal tenderness. There is no guarding or rebound.  Musculoskeletal:        General: No tenderness. Normal range of motion.     Cervical back: Normal range of motion and neck supple.  Lymphadenopathy:     Cervical: No cervical adenopathy.  Skin:    General: Skin is warm.     Coloration: Skin is not pale.     Findings: No erythema or rash.  Neurological:     Mental Status: He is alert and oriented to person, place, and time.     Cranial Nerves: No cranial nerve deficit.     Motor: No abnormal muscle tone.     Coordination: Coordination normal.     Deep Tendon Reflexes: Reflexes are normal and symmetric.  Psychiatric:        Behavior: Behavior normal.        Thought Content: Thought content normal.        Judgment: Judgment normal.

## 2023-06-23 NOTE — Telephone Encounter (Signed)
 Requested medications are due for refill today.  yes  Requested medications are on the active medications list.  yes  Last refill. 12/26/2022 #120 1 rf  Future visit scheduled.   no  Notes to clinic.  Labs are expired.    Requested Prescriptions  Pending Prescriptions Disp Refills   losartan  (COZAAR ) 100 MG tablet [Pharmacy Med Name: Losartan  Potassium 100 MG Oral Tablet] 120 tablet 2    Sig: TAKE 1 TABLET BY MOUTH IN THE  MORNING     Cardiovascular:  Angiotensin Receptor Blockers Failed - 06/23/2023  3:46 PM      Failed - Cr in normal range and within 180 days    Creat  Date Value Ref Range Status  06/12/2022 0.81 0.70 - 1.28 mg/dL Final         Failed - K in normal range and within 180 days    Potassium  Date Value Ref Range Status  06/12/2022 4.2 3.5 - 5.3 mmol/L Final         Failed - Last BP in normal range    BP Readings from Last 1 Encounters:  06/23/23 (!) 146/78         Passed - Patient is not pregnant      Passed - Valid encounter within last 6 months    Recent Outpatient Visits           Today Encounter for Medicare annual wellness exam   [place] [facility] Austine Lefort, MD   3 weeks ago Acute allergic reaction, subsequent encounter   [place] [facility] Austine Lefort, MD   1 month ago Acute allergic reaction, initial encounter   [place] [facility] Jenelle Mis, FNP   11 months ago Tendinitis of left rotator cuff   [place] [facility] Cheril Cork, Cisco Crest, MD   1 year ago Annual physical exam   [place] [facility] Pickard, Cisco Crest, MD

## 2023-06-24 ENCOUNTER — Other Ambulatory Visit: Payer: Self-pay

## 2023-06-24 MED ORDER — TRIAMTERENE-HCTZ 37.5-25 MG PO TABS: 1.0000 | ORAL_TABLET | Freq: Every day | ORAL | 3 refills | Status: DC

## 2023-07-04 ENCOUNTER — Other Ambulatory Visit: Payer: Self-pay | Admitting: Family Medicine

## 2023-07-18 ENCOUNTER — Other Ambulatory Visit

## 2023-07-24 ENCOUNTER — Encounter: Admitting: Family Medicine

## 2023-08-01 ENCOUNTER — Other Ambulatory Visit: Payer: Self-pay | Admitting: Family Medicine

## 2023-08-01 DIAGNOSIS — I10 Essential (primary) hypertension: Secondary | ICD-10-CM

## 2023-08-02 NOTE — Telephone Encounter (Signed)
 Requested Prescriptions  Pending Prescriptions Disp Refills   pantoprazole  (PROTONIX ) 40 MG tablet [Pharmacy Med Name: Pantoprazole  Sodium 40 MG Oral Tablet Delayed Release] 120 tablet 1    Sig: TAKE 1 TABLET BY MOUTH DAILY     Gastroenterology: Proton Pump Inhibitors Passed - 08/02/2023  3:11 PM      Passed - Valid encounter within last 12 months    Recent Outpatient Visits           1 month ago Encounter for Medicare annual wellness exam   [place] [facility] Austine Lefort, MD   2 months ago Acute allergic reaction, subsequent encounter   [place] [facility] Austine Lefort, MD   2 months ago Acute allergic reaction, initial encounter   [place] [facility] Jenelle Mis, FNP   1 year ago Tendinitis of left rotator cuff   [place] [facility] Cheril Cork, Cisco Crest, MD   1 year ago Annual physical exam   [place] [facility] Cheril Cork, Cisco Crest, MD               amLODipine  (NORVASC ) 10 MG tablet [Pharmacy Med Name: amLODIPine  Besylate 10 MG Oral Tablet] 120 tablet 1    Sig: TAKE 1 TABLET BY MOUTH DAILY     Cardiovascular: Calcium  Channel Blockers 2 Failed - 08/02/2023  3:11 PM      Failed - Last BP in normal range    BP Readings from Last 1 Encounters:  06/23/23 (!) 146/78         Passed - Last Heart Rate in normal range    Pulse Readings from Last 1 Encounters:  06/23/23 66         Passed - Valid encounter within last 6 months    Recent Outpatient Visits           1 month ago Encounter for Medicare annual wellness exam   [place] [facility] Austine Lefort, MD   2 months ago Acute allergic reaction, subsequent encounter   [place] [facility] Austine Lefort, MD   2 months ago Acute allergic reaction, initial encounter   [place] [facility] Jenelle Mis, FNP   1 year ago Tendinitis of left  rotator cuff   [place] [facility] Cheril Cork, Cisco Crest, MD   1 year ago Annual physical exam    [facility] Pickard, Cisco Crest, MD

## 2023-08-15 ENCOUNTER — Encounter: Payer: Self-pay | Admitting: Family Medicine

## 2023-08-19 ENCOUNTER — Ambulatory Visit: Admitting: Family Medicine

## 2023-08-19 ENCOUNTER — Encounter: Payer: Self-pay | Admitting: Family Medicine

## 2023-08-19 VITALS — BP 130/76 | HR 79 | Temp 97.8°F | Ht 70.0 in | Wt 195.0 lb

## 2023-08-19 DIAGNOSIS — J069 Acute upper respiratory infection, unspecified: Secondary | ICD-10-CM | POA: Diagnosis not present

## 2023-08-19 MED ORDER — HYDROCODONE BIT-HOMATROP MBR 5-1.5 MG/5ML PO SOLN: 5.0000 mL | Freq: Three times a day (TID) | ORAL | 0 refills | Status: DC | PRN

## 2023-08-19 NOTE — Progress Notes (Signed)
 Subjective:    Patient ID: [name], male    DOB: November 15, 1950, 73 y.o.   MRN: [id number]  Patient developed a fever on Friday.  He reports severe fatigue and bodyaches.  He also reported a nonproductive cough.  He was having shaking chills.  Cough is nonproductive.  Saturday morning he felt fine.  By Saturday evening he had developed a sore throat and fever and chills and fatigue with back.  By Sunday morning he felt well again however the symptoms returned Sunday evening.  Last night was much better.  He states that he is feeling better today.  He continues to have a cough.  He denies any chest pain, shortness of breath, or pleurisy.  Patient took a home COVID test that was negative. Past Medical History:  Diagnosis Date   Allergy    wood sap   Allergy history, seafood    oysters, mussels   Arthritis    neck   Bee sting allergy 1980   hives after 20 yellowjacket stings   Cancer (HCC)    skin cancer frozen off   Colon polyps    q 3 years, last colonoscopy 03/12/18   GERD (gastroesophageal reflux disease)    Hypercholesteremia    Hypertension    Past Surgical History:  Procedure Laterality Date   CARPAL TUNNEL RELEASE Right 1985   elbow   SHOULDER ARTHROSCOPY WITH SUBACROMIAL DECOMPRESSION AND OPEN ROTATOR C Right 10/22/2012   Procedure: RIGHT SHOULDER ARTHROSCOPY WITH SUBACROMIAL DECOMPRESSION AND  ROTATOR CUFF REPAIR,  CORACOACROMIAL LIGAMENT RELEASE AND  MANIPULATION UNDER ANESTHESIA;  Surgeon: Loel Ring, MD;  Location: [facility];  Service: Orthopedics;  Laterality: Right;   Current Outpatient Medications on File Prior to Visit  Medication Sig Dispense Refill   amLODipine  (NORVASC ) 10 MG tablet TAKE 1 TABLET BY MOUTH DAILY 120 tablet 1   doxazosin  (CARDURA ) 4 MG tablet TAKE 1 TABLET BY MOUTH DAILY 120 tablet 2   fluticasone  (FLONASE ) 50 MCG/ACT nasal spray Place 2 sprays into both nostrils daily. 16 g 11   HYDROcodone  bit-homatropine (HYCODAN) 5-1.5 MG/5ML syrup Take  5 mLs by mouth every 8 (eight) hours as needed for cough. 120 mL 0   hydrOXYzine  (VISTARIL ) 25 MG capsule Take 1 capsule (25 mg total) by mouth every 8 (eight) hours as needed for itching (allergic reaction). 30 capsule 1   KLOR-CON  M20 20 MEQ tablet TAKE 1 TABLET BY MOUTH EVERY DAY 90 tablet 3   levocetirizine (XYZAL ) 5 MG tablet TAKE 1 TABLET BY MOUTH IN THE  EVENING 120 tablet 0   losartan  (COZAAR ) 100 MG tablet TAKE 1 TABLET BY MOUTH IN THE  MORNING 120 tablet 2   pantoprazole  (PROTONIX ) 40 MG tablet TAKE 1 TABLET BY MOUTH DAILY 120 tablet 1   predniSONE  (DELTASONE ) 20 MG tablet 3 tabs poqday 1-2, 2 tabs poqday 3-4, 1 tab poqday 5-6 12 tablet 0   predniSONE  (DELTASONE ) 20 MG tablet 3 tabs poqday 1-2, 2 tabs poqday 3-4, 1 tab poqday 5-6 12 tablet 1   rosuvastatin  (CRESTOR ) 10 MG tablet TAKE 1 TABLET BY MOUTH DAILY 120 tablet 2   triamterene -hydrochlorothiazide  (MAXZIDE-25) 37.5-25 MG tablet Take 1 tablet by mouth daily. 90 tablet 3   zafirlukast  (ACCOLATE ) 20 MG tablet TAKE 1 TABLET BY MOUTH TWICE  DAILY BEFORE MEALS 240 tablet 2   oseltamivir  (TAMIFLU ) 75 MG capsule Take 1 capsule (75 mg total) by mouth daily. Dx: Z20.828- exposure to influenza (Patient not taking: Reported on 08/19/2023)  7 capsule 0   No current facility-administered medications on file prior to visit.   Allergies  Allergen Reactions   Iodine     Topical antiseptic-as a young adult. Don't know reaction   Other     Wood Sap causes Hives   Social History   Socioeconomic History   Marital status: Married    Spouse name: Not on file   Number of children: Not on file   Years of education: Not on file   Highest education level: Bachelor's degree (e.g., BA, AB, BS)  Occupational History   Not on file  Tobacco Use   Smoking status: Former    Current packs/day: 0.00    Average packs/day: 1 pack/day for 15.0 years (15.0 ttl pk-yrs)    Types: Cigarettes    Start date: 03/04/1969    Quit date: 03/04/1984    Years since  quitting: 39.4   Smokeless tobacco: Former    Types: Chew    Quit date: 03/04/2008  Substance and Sexual Activity   Alcohol use: No   Drug use: No   Sexual activity: Not on file  Other Topics Concern   Not on file  Social History Narrative   Not on file   Social Drivers of Health   Financial Resource Strain: Low Risk  (08/18/2023)   Overall Financial Resource Strain (CARDIA)    Difficulty of Paying Living Expenses: Not hard at all  Food Insecurity: No Food Insecurity (08/18/2023)   Hunger Vital Sign    Worried About Running Out of Food in the Last Year: Never true    Ran Out of Food in the Last Year: Never true  Transportation Needs: No Transportation Needs (08/18/2023)   PRAPARE - Administrator, Civil Service (Medical): No    Lack of Transportation (Non-Medical): No  Physical Activity: Insufficiently Active (08/18/2023)   Exercise Vital Sign    Days of Exercise per Week: 3 days    Minutes of Exercise per Session: 20 min  Stress: No Stress Concern Present (08/18/2023)   [employer] of Occupational Health - Occupational Stress Questionnaire    Feeling of Stress: Not at all  Social Connections: Socially Integrated (08/18/2023)   Social Connection and Isolation Panel    Frequency of Communication with Friends and Family: More than three times a week    Frequency of Social Gatherings with Friends and Family: Three times a week    Attends Religious Services: More than 4 times per year    Active Member of Clubs or Organizations: Yes    Attends Banker Meetings: More than 4 times per year    Marital Status: Married  Catering manager Violence: Not At Risk (06/23/2023)   Humiliation, Afraid, Rape, and Kick questionnaire    Fear of Current or Ex-Partner: No    Emotionally Abused: No    Physically Abused: No    Sexually Abused: No     Review of Systems  All other systems reviewed and are negative.      Objective:    Physical Exam Constitutional:       Appearance: Normal appearance. He is normal weight.  Cardiovascular:     Rate and Rhythm: Normal rate and regular rhythm.     Heart sounds: Normal heart sounds. No murmur heard.    No friction rub.  Pulmonary:     Effort: Pulmonary effort is normal. No respiratory distress.     Breath sounds: Normal breath sounds. No wheezing, rhonchi or rales.  Neurological:     Mental Status: He is alert.       Assessment & Plan:  Upper respiratory tract infection, unspecified type Patient's physical exam today is normal.  Patient's throat is clear.  There is no lymphadenopathy in the neck.  Tympanic membranes appear normal.  He has no sinus pain.  Lungs are clear to auscultation bilaterally.  Symptoms would suggest a viral upper respiratory infection following with symptoms following a diurnal fever curve.  As long as his symptoms continue to improve I would recommend symptomatic treatment only with ibuprofen

## 2023-09-06 ENCOUNTER — Other Ambulatory Visit: Payer: Self-pay | Admitting: Family Medicine

## 2023-09-19 ENCOUNTER — Encounter: Payer: Self-pay | Admitting: Advanced Practice Midwife

## 2023-12-10 ENCOUNTER — Ambulatory Visit: Admitting: Family Medicine

## 2023-12-10 ENCOUNTER — Encounter: Payer: Self-pay | Admitting: Family Medicine

## 2023-12-10 ENCOUNTER — Ambulatory Visit: Payer: Self-pay

## 2023-12-10 VITALS — BP 130/82 | HR 86 | Temp 98.2°F | Ht 70.0 in | Wt 200.0 lb

## 2023-12-10 DIAGNOSIS — R252 Cramp and spasm: Secondary | ICD-10-CM | POA: Diagnosis not present

## 2023-12-10 MED ORDER — METHOCARBAMOL 500 MG PO TABS: 500.0000 mg | ORAL_TABLET | Freq: Three times a day (TID) | ORAL | 1 refills | Status: AC | PRN

## 2023-12-10 NOTE — Telephone Encounter (Signed)
 FYI Only or Action Required?: FYI only for provider.  Patient was last seen in primary care on 08/19/2023 by Duanne Butler DASEN, MD.  Called Nurse Triage reporting Muscle Pain.  Symptoms began several days ago.  Symptoms are: gradually worsening.  Triage Disposition: See HCP Within 4 Hours (Or PCP Triage)  Patient/caregiver understands and will follow disposition?: Yes      Copied from CRM [id number]. Topic: Clinical - Red Word Triage >> Dec 10, 2023 12:40 PM Rachelle R wrote: [facility] that prompted transfer to Nurse Triage: Patient states for the past few days have had upper body muscle cramps. When they happen he has severe pain, has gotten worse over the last two days.        Reason for Disposition  [1] SEVERE pain AND [2] taking a statin medicine (a lipid or cholesterol lowering drug)  Answer Assessment - Initial Assessment Questions 1. ONSET: When did the muscle aches or body pains start?      2-3 days ago  2. LOCATION: What part of your body is hurting? (e.g., entire body, arms, legs)      Upper torso  3. SEVERITY: How bad is the pain? (Scale 1-10; or mild, moderate, severe)     Moderate to severe  4. CAUSE: What do you think is causing the pains?     Unsure  5. FEVER: Do you have a fever? If Yes, ask: What is your temperature, how was it measured, and  when did it start?      No 6. OTHER SYMPTOMS: Do you have any other symptoms? (e.g., chest pain, cold or flu symptoms, rash, weakness, weight loss)     No  Protocols used: Muscle Aches and Body Pain-A-AH

## 2023-12-10 NOTE — Progress Notes (Signed)
 Patient Office Visit  Assessment & Plan:  Muscle cramp -     CBC with Differential/Platelet -     Comprehensive metabolic panel with GFR -     CK -     Methocarbamol ; Take 1 tablet (500 mg total) by mouth every 8 (eight) hours as needed.  Dispense: 30 tablet; Refill: 1   Assessment and Plan    Acute torso muscle cramps Acute muscle cramps possibly due to recent viral illness, dehydration, or rosuvastatin . Differential includes statin-induced myopathy and electrolyte imbalance. - Order blood work: electrolytes, CBC, CK. - Prescribe muscle relaxant for future episodes.  Hyperlipidemia Rosuvastatin  use; potential link to muscle cramps considered but not confirmed. - Continue rosuvastatin  due to cardiovascular risk factors.  Hypertension Managed with triamterene -hydrochlorothiazide  and losartan .     Follow-up on lab work notify patient.  Stay well-hydrated.  If no improvement he is to notify us  or come in for reevaluation.  Robaxin  to have on hand as needed.  No follow-ups on file.   Subjective:    Patient ID: THEOPLIS GARCIAGARCIA, male    DOB: 17-Mar-1950  Age: 73 y.o. MRN: [id number]  Chief Complaint  Patient presents with   Muscle Pain    Pt c/o he had a 24 hour bug over the weekend. He states that he now has upper body cramps. He states that this has happened 6-8x over the last couple days.    Muscle Pain    History of Present Illness        History of Present Illness Tamara Kenyon is a 73 year old male who presents with debilitating torso cramps.  He has been experiencing debilitating cramps in his torso for the past two and a half days. The cramps originate in the flank/trapezius area, causing him to lean over and rub the affected side, which then leads to the other side forming a knot. The cramps spread throughout his torso and last between fifteen to thirty minutes. He has had six to eight episodes over the past two days, occurring both day and night.  The cramps are very painful, preventing movement, and he has to stop all activities when they occur.  States he would not be able to work with these cramps. He does not have leg cramps or muscle aches. He has been able to sleep if he finds the right position in bed.  He mentions a recent viral illness that began on Saturday, characterized by a fever ranging from 99 to 101 degrees, which resolved within 24 hours. During this illness, he lost his appetite and has not been eating well for the past three to four days, although he has been maintaining good fluid intake. He has been trying to cut down on soda consumption and has not had any soda in three days.  He is currently on rosuvastatin , which he was previously off but was restarted. He also takes a daily 81 mg aspirin. He has not taken any pain medication for the cramps, as he believes they will resolve on their own. He uses potassium chloride  instead of sodium chloride  in his cooking and does not consume bananas. He takes multiple prescription medications, including a diuretic (triamterene  hydrochlorothiazide ) and losartan  for blood pressure, and Xyzal  for allergies.  No changes in urination or urine color, although he notes that his multivitamins cause a yellow cast. He has not been more physically active than usual and denies any new vitamins or supplements.  Assessment and Plan Acute torso muscle  cramps Acute muscle cramps possibly due to recent viral illness, dehydration, or rosuvastatin . Differential includes statin-induced myopathy and electrolyte imbalance. - Order blood work: electrolytes/CMP, CBC, CK. - Prescribe muscle relaxant for future episodes.  Hyperlipidemia Rosuvastatin  use; potential link to muscle cramps considered but not confirmed. - Continue rosuvastatin  due to cardiovascular risk factors.  Hypertension Managed with triamterene -hydrochlorothiazide  and losartan  and Norvasc     The 10-year ASCVD risk score (Arnett DK, et  al., 2019) is: 19.2%  Past Medical History:  Diagnosis Date   Allergy 1995   wood sap   Allergy history, seafood    oysters, mussels   Arthritis 1990   neck   Bee sting allergy 1980   hives after 20 yellowjacket stings   Cancer (HCC)    skin cancer frozen off   Cataract 2019 estimate   Colon polyps    q 3 years, last colonoscopy 03/12/18   GERD (gastroesophageal reflux disease) 1985   Hypercholesteremia    Hypertension 1984   Past Surgical History:  Procedure Laterality Date   CARPAL TUNNEL RELEASE Right 1985   elbow   SHOULDER ARTHROSCOPY WITH SUBACROMIAL DECOMPRESSION AND OPEN ROTATOR C Right 10/22/2012   Procedure: RIGHT SHOULDER ARTHROSCOPY WITH SUBACROMIAL DECOMPRESSION AND  ROTATOR CUFF REPAIR,  CORACOACROMIAL LIGAMENT RELEASE AND  MANIPULATION UNDER ANESTHESIA;  Surgeon: Reyes JAYSON Billing, MD;  Location: [facility];  Service: Orthopedics;  Laterality: Right;   Social History   Tobacco Use   Smoking status: Former    Current packs/day: 0.00    Average packs/day: 1 pack/day for 15.0 years (15.0 ttl pk-yrs)    Types: Cigarettes    Start date: 03/04/1969    Quit date: 03/04/1984    Years since quitting: 39.7   Smokeless tobacco: Former    Types: Chew    Quit date: 03/04/2008  Substance Use Topics   Alcohol use: No   Drug use: No   Family History  Problem Relation Age of Onset   Heart failure Mother    Hypertension Mother    Heart failure Father    Cancer Father    Diabetes Maternal Aunt    Diabetes Maternal Uncle    Allergies  Allergen Reactions   Iodine     Topical antiseptic-as a young adult. Don't know reaction   Other     Wood Sap causes Hives    ROS    Objective:    BP 130/82   Pulse 86   Temp 98.2 F (36.8 C)   Ht 5' 10 (1.778 m)   Wt 200 lb (90.7 kg)   SpO2 98%   BMI 28.70 kg/m  BP Readings from Last 3 Encounters:  12/10/23 130/82  08/19/23 130/76  06/23/23 (!) 146/78   Wt Readings from Last 3 Encounters:  12/10/23 200 lb (90.7 kg)   08/19/23 195 lb (88.5 kg)  06/23/23 203 lb (92.1 kg)    Physical Exam Vitals and nursing note reviewed.  Constitutional:      General: He is not in acute distress.    Appearance: Normal appearance.  HENT:     Head: Normocephalic.     Ears:     Comments: Has hearing aids.  Eyes:     Extraocular Movements: Extraocular movements intact.     Pupils: Pupils are equal, round, and reactive to light.  Cardiovascular:     Rate and Rhythm: Normal rate and regular rhythm.     Heart sounds: Normal heart sounds.  Pulmonary:     Effort: Pulmonary effort  is normal.     Breath sounds: Normal breath sounds. No wheezing.  Abdominal:     Tenderness: There is no abdominal tenderness. There is no right CVA tenderness or left CVA tenderness.  Musculoskeletal:     Right lower leg: No edema.     Left lower leg: No edema.     Comments: Patient started to have muscle cramps during the office visit over flank area/latissimus dorsi  Neurological:     General: No focal deficit present.     Mental Status: He is alert and oriented to person, place, and time.  Psychiatric:        Mood and Affect: Mood normal.        Behavior: Behavior normal.      No results found for any visits on 12/10/23.

## 2023-12-11 ENCOUNTER — Emergency Department (HOSPITAL_COMMUNITY): Admission: EM | Admit: 2023-12-11 | Discharge: 2023-12-11 | Disposition: A | Discharge status: Home / Self Care

## 2023-12-11 ENCOUNTER — Other Ambulatory Visit: Payer: Self-pay

## 2023-12-11 ENCOUNTER — Encounter (HOSPITAL_COMMUNITY): Payer: Self-pay | Admitting: *Deleted

## 2023-12-11 ENCOUNTER — Ambulatory Visit: Payer: Self-pay | Admitting: Family Medicine

## 2023-12-11 DIAGNOSIS — R799 Abnormal finding of blood chemistry, unspecified: Secondary | ICD-10-CM | POA: Diagnosis present

## 2023-12-11 DIAGNOSIS — E860 Dehydration: Secondary | ICD-10-CM | POA: Insufficient documentation

## 2023-12-11 DIAGNOSIS — R7989 Other specified abnormal findings of blood chemistry: Secondary | ICD-10-CM

## 2023-12-11 DIAGNOSIS — E876 Hypokalemia: Secondary | ICD-10-CM | POA: Insufficient documentation

## 2023-12-11 LAB — COMPREHENSIVE METABOLIC PANEL WITH GFR
AG Ratio: 1.8 (calc) (ref 1.0–2.5)
ALT: 31 U/L (ref 9–46)
ALT: 34 U/L (ref 0–44)
AST: 35 U/L (ref 10–35)
AST: 44 U/L — ABNORMAL HIGH (ref 15–41)
Albumin: 4.5 g/dL (ref 3.6–5.1)
Albumin: 4 g/dL (ref 3.5–5.0)
Alkaline Phosphatase: 50 U/L (ref 38–126)
Alkaline phosphatase (APISO): 54 U/L (ref 35–144)
Anion gap: 14 (ref 5–15)
BUN/Creatinine Ratio: 12 (calc) (ref 6–22)
BUN: 23 mg/dL (ref 7–25)
BUN: 20 mg/dL (ref 8–23)
CO2: 28 mmol/L (ref 20–32)
CO2: 21 mmol/L — ABNORMAL LOW (ref 22–32)
Calcium: 10.1 mg/dL (ref 8.6–10.3)
Calcium: 9 mg/dL (ref 8.9–10.3)
Chloride: 96 mmol/L — ABNORMAL LOW (ref 98–110)
Chloride: 99 mmol/L (ref 98–111)
Creat: 1.93 mg/dL — ABNORMAL HIGH (ref 0.70–1.28)
Creatinine, Ser: 1.55 mg/dL — ABNORMAL HIGH (ref 0.61–1.24)
GFR, Estimated: 48 mL/min — ABNORMAL LOW (ref >60)
Globulin: 2.5 g/dL (ref 1.9–3.7)
Glucose, Bld: 108 mg/dL — ABNORMAL HIGH (ref 65–99)
Glucose, Bld: 109 mg/dL — ABNORMAL HIGH (ref 70–99)
Potassium: 3.4 mmol/L — ABNORMAL LOW (ref 3.5–5.3)
Potassium: 3.4 mmol/L — ABNORMAL LOW (ref 3.5–5.1)
Sodium: 132 mmol/L — ABNORMAL LOW (ref 135–146)
Sodium: 134 mmol/L — ABNORMAL LOW (ref 135–145)
Total Bilirubin: 0.8 mg/dL (ref 0.2–1.2)
Total Bilirubin: 0.9 mg/dL (ref 0.0–1.2)
Total Protein: 7 g/dL (ref 6.1–8.1)
Total Protein: 7.1 g/dL (ref 6.5–8.1)
eGFR: 37 mL/min/1.73m2 — ABNORMAL LOW

## 2023-12-11 LAB — CBC WITH DIFFERENTIAL/PLATELET
Absolute Lymphocytes: 1916 {cells}/uL (ref 850–3900)
Absolute Monocytes: 917 {cells}/uL (ref 200–950)
Basophils Absolute: 72 {cells}/uL (ref 0–200)
Basophils Relative: 0.7 %
Eosinophils Absolute: 82 {cells}/uL (ref 15–500)
Eosinophils Relative: 0.8 %
HCT: 41.6 % (ref 38.5–50.0)
Hemoglobin: 14.7 g/dL (ref 13.2–17.1)
MCH: 33.3 pg — ABNORMAL HIGH (ref 27.0–33.0)
MCHC: 35.3 g/dL (ref 32.0–36.0)
MCV: 94.1 fL (ref 80.0–100.0)
MPV: 11 fL (ref 7.5–12.5)
Monocytes Relative: 8.9 %
Neutro Abs: 7313 {cells}/uL (ref 1500–7800)
Neutrophils Relative %: 71 %
Platelets: 225 Thousand/uL (ref 140–400)
RBC: 4.42 Million/uL (ref 4.20–5.80)
RDW: 11.8 % (ref 11.0–15.0)
Total Lymphocyte: 18.6 %
WBC: 10.3 Thousand/uL (ref 3.8–10.8)

## 2023-12-11 LAB — MAGNESIUM: Magnesium: 2 mg/dL (ref 1.7–2.4)

## 2023-12-11 LAB — CK
Total CK: 600 U/L — ABNORMAL HIGH (ref 19–278)
Total CK: 702 U/L — ABNORMAL HIGH (ref 49–397)

## 2023-12-11 LAB — CBC
HCT: 39.7 % (ref 39.0–52.0)
Hemoglobin: 14.1 g/dL (ref 13.0–17.0)
MCH: 32.3 pg (ref 26.0–34.0)
MCHC: 35.5 g/dL (ref 30.0–36.0)
MCV: 91.1 fL (ref 80.0–100.0)
Platelets: 230 K/uL (ref 150–400)
RBC: 4.36 MIL/uL (ref 4.22–5.81)
RDW: 11.8 % (ref 11.5–15.5)
WBC: 7.6 K/uL (ref 4.0–10.5)
nRBC: 0 % (ref 0.0–0.2)

## 2023-12-11 MED ORDER — POTASSIUM CHLORIDE ER 10 MEQ PO TBCR
40.0000 meq | EXTENDED_RELEASE_TABLET | Freq: Once | ORAL | Status: AC
  Administered 2023-12-11: 40 meq via ORAL
  Filled 2023-12-11: qty 4

## 2023-12-11 MED ORDER — LACTATED RINGERS IV BOLUS
1000.0000 mL | Freq: Once | INTRAVENOUS | Status: AC
  Administered 2023-12-11: 1000 mL via INTRAVENOUS

## 2023-12-11 NOTE — ED Provider Notes (Signed)
 Accepted handoff at shift change from [facility]. Please see prior provider note for more detail.   Briefly: Patient is 73 y.o.   DDX: concern for Ck and mag pending 2/2 hemolysis. 24 hr gi bug, NVD. Creatinine went form 0.8 --> 1.9. Downtrending already. Got fluids and potassium PO.   Plan: Mag level normal at 2.0.  CK is mildly elevated at 702, but not significantly uptrending from yesterday, yesterday at 600.  I think continuing to orally rehydrate, and follow-up closely with PCP for creatinine recheck is very reasonable.     Rosan Sherlean DEL, PA-C 12/11/23 1625    Francesca Elsie CROME, MD 12/12/23 2150

## 2023-12-11 NOTE — Discharge Instructions (Signed)
 Your labs are improving from your visit yesterday.  With continued hydration you will continue to improve. Your magnesium and creatinine kinase--muscle enzyme--levels were also reassuring.  Touch base with your primary care provider about a follow-up appointment to continue to trend your labs.

## 2023-12-11 NOTE — ED Triage Notes (Signed)
 Pt. Stated, I was called this morning and told to come in for abnormal labs.

## 2023-12-11 NOTE — ED Triage Notes (Signed)
 Pt had been sick Sunday-Monday with fever and fatigue and slept without taking any po during this time and was having muscle spasms and was seen at PCP and had blood work done yesterday.  They called him to tell him to come in for evaluation due to abnormal labs.  Pt is feeling perfectly fine now and is no longer having spasms and is feeling well now

## 2023-12-11 NOTE — ED Provider Notes (Signed)
 [name] Provider Note   CSN: [id number] Arrival date & time: 12/11/23  9144     Patient presents with: Abnormal Lab   [name] is a 73 y.o. male.  {Add pertinent medical, surgical, social history, OB history to HPI:32947}  Abnormal Lab          Prior to Admission medications   Medication Sig Start Date End Date Taking? Authorizing Provider  amLODipine  (NORVASC ) 10 MG tablet TAKE 1 TABLET BY MOUTH DAILY 08/02/23   Duanne Butler DASEN, MD  doxazosin  (CARDURA ) 4 MG tablet TAKE 1 TABLET BY MOUTH DAILY 02/21/23   Duanne Butler DASEN, MD  fluticasone  (FLONASE ) 50 MCG/ACT nasal spray Place 2 sprays into both nostrils daily. 05/29/23   Duanne Butler DASEN, MD  hydrOXYzine  (VISTARIL ) 25 MG capsule Take 1 capsule (25 mg total) by mouth every 8 (eight) hours as needed for itching (allergic reaction). 05/29/23   Duanne Butler DASEN, MD  KLOR-CON  M20 20 MEQ tablet TAKE 1 TABLET BY MOUTH EVERY DAY 12/06/22   Duanne Butler DASEN, MD  levocetirizine (XYZAL ) 5 MG tablet TAKE 1 TABLET BY MOUTH EVERY  EVENING 09/08/23   Duanne Butler DASEN, MD  losartan  (COZAAR ) 100 MG tablet TAKE 1 TABLET BY MOUTH IN THE  MORNING 06/23/23   Duanne Butler DASEN, MD  methocarbamol  (ROBAXIN ) 500 MG tablet Take 1 tablet (500 mg total) by mouth every 8 (eight) hours as needed. 12/10/23   Aletha Bene, MD  oseltamivir  (TAMIFLU ) 75 MG capsule Take 1 capsule (75 mg total) by mouth daily. Dx: Z20.828- exposure to influenza Patient not taking: Reported on 12/10/2023 04/10/23   Duanne Butler DASEN, MD  pantoprazole  (PROTONIX ) 40 MG tablet TAKE 1 TABLET BY MOUTH DAILY 08/02/23   Duanne Butler DASEN, MD  rosuvastatin  (CRESTOR ) 10 MG tablet TAKE 1 TABLET BY MOUTH DAILY 07/07/23   Duanne Butler DASEN, MD  triamterene -hydrochlorothiazide  (MAXZIDE-25) 37.5-25 MG tablet Take 1 tablet by mouth daily. 06/24/23   Duanne Butler DASEN, MD  zafirlukast  (ACCOLATE ) 20 MG tablet TAKE 1 TABLET BY MOUTH TWICE  DAILY  BEFORE MEALS 02/21/23   Duanne Butler DASEN, MD    Allergies: Iodine and Other    Review of Systems  Updated Vital Signs BP (!) 145/77 (BP Location: Right Arm)   Pulse 81   Temp 98 F (36.7 C)   Resp 18   SpO2 100%   Physical Exam Vitals and nursing note reviewed.  Constitutional:      General: He is not in acute distress. HENT:     Head: Normocephalic and atraumatic.     Nose: Nose normal.     Mouth/Throat:     Mouth: Mucous membranes are moist.  Eyes:     General: No scleral icterus. Cardiovascular:     Rate and Rhythm: Normal rate and regular rhythm.     Pulses: Normal pulses.     Heart sounds: Normal heart sounds.  Pulmonary:     Effort: Pulmonary effort is normal. No respiratory distress.     Breath sounds: No wheezing.  Abdominal:     Palpations: Abdomen is soft.     Tenderness: There is no abdominal tenderness. There is no guarding or rebound.  Musculoskeletal:     Cervical back: Normal range of motion.     Right lower leg: No edema.     Left lower leg: No edema.  Skin:    General: Skin is warm and dry.     Capillary  Refill: Capillary refill takes less than 2 seconds.  Neurological:     Mental Status: He is alert. Mental status is at baseline.  Psychiatric:        Mood and Affect: Mood normal.        Behavior: Behavior normal.     (all labs ordered are listed, but only abnormal results are displayed) Labs Reviewed  COMPREHENSIVE METABOLIC PANEL WITH GFR - Abnormal; Notable for the following components:      Result Value   Sodium 134 (*)    Potassium 3.4 (*)    CO2 21 (*)    Glucose, Bld 109 (*)    Creatinine, Ser 1.55 (*)    AST 44 (*)    GFR, Estimated 48 (*)    All other components within normal limits  CBC  MAGNESIUM  CK    EKG: None  Radiology: No results found.  {Document cardiac monitor, telemetry assessment procedure when appropriate:32947} Procedures   Medications Ordered in the ED  lactated ringers  bolus 1,000 mL (has no  administration in time range)  potassium chloride  (KLOR-CON ) CR tablet 40 mEq (has no administration in time range)      {Click here for ABCD2, HEART and other calculators REFRESH Note before signing:1}                              Medical Decision Making Amount and/or Complexity of Data Reviewed Labs: ordered.  Risk Prescription drug management.         Final diagnoses:  None    ED Discharge Orders     None

## 2023-12-15 ENCOUNTER — Encounter: Payer: Self-pay | Admitting: Family Medicine

## 2023-12-15 ENCOUNTER — Ambulatory Visit: Admitting: Family Medicine

## 2023-12-15 VITALS — BP 140/68 | HR 80 | Temp 98.2°F | Ht 70.0 in | Wt 201.0 lb

## 2023-12-15 DIAGNOSIS — R748 Abnormal levels of other serum enzymes: Secondary | ICD-10-CM | POA: Diagnosis not present

## 2023-12-15 MED ORDER — NEBIVOLOL HCL 10 MG PO TABS: 10.0000 mg | ORAL_TABLET | Freq: Every day | ORAL | 3 refills | Status: AC

## 2023-12-15 NOTE — Progress Notes (Signed)
 Subjective:    Patient ID: [name], male    DOB: [birth year], 73 y.o.   MRN: [id number]  Patient has a history of hypokalemia as well as difficult to control hypertension.  Recently switched his hydrochlorothiazide  to triamterene /hydrochlorothiazide  in an attempt to help also have his low potassium.  However there was a miscommunication.  The patient continued to take hydrochlorothiazide  along with triamterene  hydrochlorothiazide .  He states that he had been gradually getting cramps.  Then recently he developed some type of viral infection.  He felt extremely weak and tired.  He was having severe muscle cramps.  The situation got so bad he went to the emergency room.  His creatinine was elevated at 1.9.  His baseline is less than 1.  His potassium level was low at 3.3.  His CK level was near 700.  Patient was treated but given IV fluids and was sent home.  He has been hydrating aggressively at home with water.  He states that the cramps have stopped.  However he was still taking triamterene /hydrochlorothiazide  along with hydrochlorothiazide  and rosuvastatin  Past Medical History:  Diagnosis Date   Allergy 1995   wood sap   Allergy history, seafood    oysters, mussels   Arthritis 1990   neck   Bee sting allergy 1980   hives after 20 yellowjacket stings   Cancer (HCC)    skin cancer frozen off   Cataract 2019 estimate   Colon polyps    q 3 years, last colonoscopy 03/12/18   GERD (gastroesophageal reflux disease) 1985   Hypercholesteremia    Hypertension 1984   Past Surgical History:  Procedure Laterality Date   CARPAL TUNNEL RELEASE Right 1985   elbow   SHOULDER ARTHROSCOPY WITH SUBACROMIAL DECOMPRESSION AND OPEN ROTATOR C Right 10/22/2012   Procedure: RIGHT SHOULDER ARTHROSCOPY WITH SUBACROMIAL DECOMPRESSION AND  ROTATOR CUFF REPAIR,  CORACOACROMIAL LIGAMENT RELEASE AND  MANIPULATION UNDER ANESTHESIA;  Surgeon: Reyes JAYSON Billing, MD;  Location: [facility];  Service: Orthopedics;   Laterality: Right;   Current Outpatient Medications on File Prior to Visit  Medication Sig Dispense Refill   amLODipine  (NORVASC ) 10 MG tablet TAKE 1 TABLET BY MOUTH DAILY 120 tablet 1   Coenzyme Q10 (COQ10) 30 MG CAPS Take 1 tablet by mouth daily.     doxazosin  (CARDURA ) 4 MG tablet TAKE 1 TABLET BY MOUTH DAILY 120 tablet 2   hydrOXYzine  (VISTARIL ) 25 MG capsule Take 1 capsule (25 mg total) by mouth every 8 (eight) hours as needed for itching (allergic reaction). 30 capsule 1   KLOR-CON  M20 20 MEQ tablet TAKE 1 TABLET BY MOUTH EVERY DAY 90 tablet 3   levocetirizine (XYZAL ) 5 MG tablet TAKE 1 TABLET BY MOUTH EVERY  EVENING 120 tablet 0   losartan  (COZAAR ) 100 MG tablet TAKE 1 TABLET BY MOUTH IN THE  MORNING 120 tablet 2   methocarbamol  (ROBAXIN ) 500 MG tablet Take 1 tablet (500 mg total) by mouth every 8 (eight) hours as needed. 30 tablet 1   Multiple Vitamins-Minerals (MULTIVITAL PO) Take 1 tablet by mouth daily.     Multiple Vitamins-Minerals (OCUVITE PRESERVISION PO) Take 1 tablet by mouth daily.     pantoprazole  (PROTONIX ) 40 MG tablet TAKE 1 TABLET BY MOUTH DAILY 120 tablet 1   rosuvastatin  (CRESTOR ) 10 MG tablet TAKE 1 TABLET BY MOUTH DAILY 120 tablet 2   Saw Palmetto, Serenoa repens, (SAW PALMETTO PO) Take 1 tablet by mouth daily.     VITAMIN D PO Take  1 tablet by mouth daily.     zafirlukast  (ACCOLATE ) 20 MG tablet TAKE 1 TABLET BY MOUTH TWICE  DAILY BEFORE MEALS 240 tablet 2   fluticasone  (FLONASE ) 50 MCG/ACT nasal spray Place 2 sprays into both nostrils daily. (Patient taking differently: Place 2 sprays into both nostrils daily as needed for allergies or rhinitis.) 16 g 11   No current facility-administered medications on file prior to visit.   Allergies  Allergen Reactions   Iodine     Topical antiseptic-as a young adult. Don't know reaction  Tolerated betadine.   Other     Wood Sap causes [employer] Other (See Comments)    Stomach cramping   Social History    Socioeconomic History   Marital status: Married    Spouse name: Not on file   Number of children: Not on file   Years of education: Not on file   Highest education level: Bachelor's degree (e.g., BA, AB, BS)  Occupational History   Not on file  Tobacco Use   Smoking status: Former    Current packs/day: 0.00    Average packs/day: 1 pack/day for 15.0 years (15.0 ttl pk-yrs)    Types: Cigarettes    Start date: 03/04/1969    Quit date: 03/04/1984    Years since quitting: 39.8   Smokeless tobacco: Former    Types: Chew    Quit date: 03/04/2008  Substance and Sexual Activity   Alcohol use: No   Drug use: No   Sexual activity: Not on file  Other Topics Concern   Not on file  Social History Narrative   Not on file   Social Drivers of Health   Financial Resource Strain: Low Risk  (12/12/2023)   Overall Financial Resource Strain (CARDIA)    Difficulty of Paying Living Expenses: Not hard at all  Food Insecurity: No Food Insecurity (12/12/2023)   Hunger Vital Sign    Worried About Running Out of Food in the Last Year: Never true    Ran Out of Food in the Last Year: Never true  Transportation Needs: No Transportation Needs (12/12/2023)   PRAPARE - Administrator, Civil Service (Medical): No    Lack of Transportation (Non-Medical): No  Physical Activity: Insufficiently Active (12/12/2023)   Exercise Vital Sign    Days of Exercise per Week: 5 days    Minutes of Exercise per Session: 20 min  Stress: No Stress Concern Present (12/12/2023)   [employer] of Occupational Health - Occupational Stress Questionnaire    Feeling of Stress: Not at all  Social Connections: Socially Integrated (12/12/2023)   Social Connection and Isolation Panel    Frequency of Communication with Friends and Family: More than three times a week    Frequency of Social Gatherings with Friends and Family: Three times a week    Attends Religious Services: More than 4 times per year    Active  Member of Clubs or Organizations: Yes    Attends Banker Meetings: More than 4 times per year    Marital Status: Married  Catering manager Violence: Not At Risk (06/23/2023)   Humiliation, Afraid, Rape, and Kick questionnaire    Fear of Current or Ex-Partner: No    Emotionally Abused: No    Physically Abused: No    Sexually Abused: No     Review of Systems  All other systems reviewed and are negative.      Objective:    Physical Exam Constitutional:  Appearance: Normal appearance. He is normal weight.  Cardiovascular:     Rate and Rhythm: Normal rate and regular rhythm.     Heart sounds: Normal heart sounds. No murmur heard.    No friction rub.  Pulmonary:     Effort: Pulmonary effort is normal. No respiratory distress.     Breath sounds: Normal breath sounds. No wheezing, rhonchi or rales.  Neurological:     Mental Status: He is alert.       Assessment & Plan:  Elevated CK - Plan: Basic Metabolic Panel Without GFR, CK I believe the patient was dehydrated primarily due to taking too much hydrochlorothiazide .  I believe this was causing the hypokalemia and the elevated creatinine.  This also probably led to cramps.  However his CK levels were significantly elevated.  The question is whether this is due to dehydration or whether rosuvastatin  could be playing a role.  Therefore I will repeat his BMP and his CK level.  I recommended stopping hydrochlorothiazide  and triamterene  hydrochlorothiazide  and Potassium.  We will replace this with Nebivolol 10 mg a day for his blood pressure.  I will check a CK level.  If completely back to normal this could have also been due to dehydration.  However if his CK level is still elevated I would recommend discontinuation of rosuvastatin  due to possible statin myopathy

## 2023-12-16 ENCOUNTER — Other Ambulatory Visit: Payer: Self-pay

## 2023-12-16 ENCOUNTER — Encounter: Payer: Self-pay | Admitting: Family Medicine

## 2023-12-16 ENCOUNTER — Ambulatory Visit: Payer: Self-pay | Admitting: Family Medicine

## 2023-12-16 LAB — BASIC METABOLIC PANEL WITHOUT GFR
BUN: 18 mg/dL (ref 7–25)
CO2: 29 mmol/L (ref 20–32)
Calcium: 9.4 mg/dL (ref 8.6–10.3)
Chloride: 101 mmol/L (ref 98–110)
Creat: 1.16 mg/dL (ref 0.70–1.28)
Glucose, Bld: 105 mg/dL — ABNORMAL HIGH (ref 65–99)
Potassium: 3.5 mmol/L (ref 3.5–5.3)
Sodium: 136 mmol/L (ref 135–146)

## 2023-12-16 LAB — CK: Total CK: 363 U/L — ABNORMAL HIGH (ref 19–278)

## 2023-12-16 NOTE — Telephone Encounter (Signed)
 Prescription Request  12/16/2023  LOV: 12/15/23  What is the name of the medication or equipment? potassium chloride  SA (KLOR-CON ) 20 MEQ tablet [[id number]]  DISCONTINUED   Have you contacted your pharmacy to request a refill? Yes   Which pharmacy would you like this sent to?  [id number] [address], [place] - [address] [address] [address] [place] [address] Phone: [phone] Fax: [phone]    Patient notified that their request is being sent to the clinical staff for review and that they should receive a response within 2 business days.   Please advise at [facility] [phone]

## 2023-12-18 MED ORDER — POTASSIUM CHLORIDE CRYS ER 20 MEQ PO TBCR: 20.0000 meq | EXTENDED_RELEASE_TABLET | Freq: Every day | ORAL | 0 refills | Status: DC

## 2023-12-18 NOTE — Telephone Encounter (Signed)
 Requested Prescriptions  Pending Prescriptions Disp Refills   potassium chloride  SA (KLOR-CON  M20) 20 MEQ tablet 90 tablet 0    Sig: Take 1 tablet (20 mEq total) by mouth daily.     Endocrinology:  Minerals - Potassium Supplementation Passed - 12/18/2023 11:54 AM      Passed - K in normal range and within 360 days    Potassium  Date Value Ref Range Status  12/15/2023 3.5 3.5 - 5.3 mmol/L Final         Passed - Cr in normal range and within 360 days    Creat  Date Value Ref Range Status  12/15/2023 1.16 0.70 - 1.28 mg/dL Final         Passed - Valid encounter within last 12 months    Recent Outpatient Visits           3 days ago Elevated CK   [place] [facility] Pickard, Butler DASEN, MD   1 week ago Muscle cramp   [facility] Aletha Bene, MD   4 months ago Upper respiratory tract infection, unspecified type   [place] [facility] Duanne Butler DASEN, MD   5 months ago Encounter for Medicare annual wellness exam   [place] [facility] Duanne Butler DASEN, MD   6 months ago Acute allergic reaction, subsequent encounter    [facility] Pickard, Butler DASEN, MD

## 2024-01-19 ENCOUNTER — Other Ambulatory Visit (INDEPENDENT_AMBULATORY_CARE_PROVIDER_SITE_OTHER)

## 2024-01-19 DIAGNOSIS — Z23 Encounter for immunization: Secondary | ICD-10-CM | POA: Diagnosis not present

## 2024-01-19 DIAGNOSIS — R748 Abnormal levels of other serum enzymes: Secondary | ICD-10-CM

## 2024-01-19 DIAGNOSIS — R252 Cramp and spasm: Secondary | ICD-10-CM

## 2024-01-19 LAB — CK: Total CK: 145 U/L (ref 19–278)

## 2024-01-19 NOTE — Progress Notes (Signed)
 Patient is in office today for a nurse visit for Immunization. Patient Injection was given in the  Left deltoid. Patient tolerated injection well.

## 2024-01-20 ENCOUNTER — Ambulatory Visit: Payer: Self-pay | Admitting: Family Medicine

## 2024-02-07 ENCOUNTER — Other Ambulatory Visit: Payer: Self-pay | Admitting: Family Medicine

## 2024-02-11 ENCOUNTER — Other Ambulatory Visit: Payer: Self-pay | Admitting: Family Medicine

## 2024-02-11 DIAGNOSIS — T7840XA Allergy, unspecified, initial encounter: Secondary | ICD-10-CM

## 2024-02-11 DIAGNOSIS — K222 Esophageal obstruction: Secondary | ICD-10-CM

## 2024-02-18 ENCOUNTER — Other Ambulatory Visit: Payer: Self-pay | Admitting: Family Medicine

## 2024-04-04 ENCOUNTER — Other Ambulatory Visit: Payer: Self-pay | Admitting: Family Medicine

## 2024-06-21 ENCOUNTER — Encounter: Admitting: Family Medicine
